# Patient Record
Sex: Male | Born: 1955
Health system: Southern US, Community
[De-identification: ages and names within clinical notes are randomized; demographics above are authoritative.]

## PROBLEM LIST (undated history)

## (undated) DIAGNOSIS — IMO0002 Reserved for concepts with insufficient information to code with codable children: Secondary | ICD-10-CM

## (undated) DIAGNOSIS — C9110 Chronic lymphocytic leukemia of B-cell type not having achieved remission: Secondary | ICD-10-CM

## (undated) DIAGNOSIS — K219 Gastro-esophageal reflux disease without esophagitis: Secondary | ICD-10-CM

## (undated) DIAGNOSIS — Z800 Family history of malignant neoplasm of digestive organs: Secondary | ICD-10-CM

## (undated) DIAGNOSIS — I639 Cerebral infarction, unspecified: Secondary | ICD-10-CM

## (undated) DIAGNOSIS — E119 Type 2 diabetes mellitus without complications: Secondary | ICD-10-CM

## (undated) DIAGNOSIS — G4730 Sleep apnea, unspecified: Secondary | ICD-10-CM

## (undated) DIAGNOSIS — K602 Anal fissure, unspecified: Secondary | ICD-10-CM

## (undated) DIAGNOSIS — K625 Hemorrhage of anus and rectum: Secondary | ICD-10-CM

## (undated) DIAGNOSIS — H409 Unspecified glaucoma: Secondary | ICD-10-CM

## (undated) DIAGNOSIS — M1990 Unspecified osteoarthritis, unspecified site: Secondary | ICD-10-CM

## (undated) DIAGNOSIS — I10 Essential (primary) hypertension: Secondary | ICD-10-CM

## (undated) DIAGNOSIS — G4733 Obstructive sleep apnea (adult) (pediatric): Secondary | ICD-10-CM

## (undated) DIAGNOSIS — K760 Fatty (change of) liver, not elsewhere classified: Secondary | ICD-10-CM

## (undated) HISTORY — DX: Reserved for concepts with insufficient information to code with codable children: IMO0002

## (undated) HISTORY — PX: TONSILLECTOMY: SUR1361

## (undated) HISTORY — PX: GLAUCOMA SURGERY: SHX656

## (undated) HISTORY — DX: Anal fissure, unspecified: K60.2

## (undated) HISTORY — DX: Fatty (change of) liver, not elsewhere classified: K76.0

## (undated) HISTORY — DX: Obstructive sleep apnea (adult) (pediatric): G47.33

## (undated) HISTORY — DX: Family history of malignant neoplasm of digestive organs: Z80.0

## (undated) HISTORY — DX: Sleep apnea, unspecified: G47.30

## (undated) HISTORY — DX: Type 2 diabetes mellitus without complications: E11.9

## (undated) HISTORY — DX: Cerebral infarction, unspecified: I63.9

## (undated) HISTORY — PX: LUMBAR DISC SURGERY: SHX700

## (undated) HISTORY — DX: Gastro-esophageal reflux disease without esophagitis: K21.9

## (undated) HISTORY — DX: Unspecified osteoarthritis, unspecified site: M19.90

## (undated) HISTORY — DX: Chronic lymphocytic leukemia of B-cell type not having achieved remission: C91.10

## (undated) HISTORY — PX: PYLOROPLASTY: SHX418

## (undated) HISTORY — DX: Hemorrhage of anus and rectum: K62.5

## (undated) HISTORY — DX: Unspecified glaucoma: H40.9

## (undated) HISTORY — PX: EYE SURGERY: SHX253

## (undated) HISTORY — DX: Essential (primary) hypertension: I10

---

## 2004-04-05 ENCOUNTER — Ambulatory Visit: Payer: Self-pay | Admitting: Family Medicine

## 2004-12-03 ENCOUNTER — Ambulatory Visit: Payer: Self-pay | Admitting: Family Medicine

## 2004-12-07 ENCOUNTER — Ambulatory Visit: Payer: Self-pay | Admitting: Cardiology

## 2004-12-22 ENCOUNTER — Ambulatory Visit: Payer: Self-pay | Admitting: Family Medicine

## 2005-08-29 ENCOUNTER — Ambulatory Visit: Payer: Self-pay | Admitting: Family Medicine

## 2005-09-01 ENCOUNTER — Ambulatory Visit: Payer: Self-pay | Admitting: Family Medicine

## 2006-03-10 ENCOUNTER — Ambulatory Visit: Payer: Self-pay | Admitting: Family Medicine

## 2006-07-13 ENCOUNTER — Ambulatory Visit: Payer: Self-pay | Admitting: Family Medicine

## 2007-01-25 ENCOUNTER — Ambulatory Visit: Payer: Self-pay | Admitting: Family Medicine

## 2007-01-25 DIAGNOSIS — N529 Male erectile dysfunction, unspecified: Secondary | ICD-10-CM | POA: Insufficient documentation

## 2007-01-25 DIAGNOSIS — M109 Gout, unspecified: Secondary | ICD-10-CM | POA: Insufficient documentation

## 2007-02-23 ENCOUNTER — Ambulatory Visit: Payer: Self-pay | Admitting: Family Medicine

## 2008-10-16 ENCOUNTER — Ambulatory Visit: Payer: Self-pay | Admitting: Family Medicine

## 2008-10-16 LAB — CONVERTED CEMR LAB
ALT: 22 units/L (ref 0–53)
Albumin: 4.5 g/dL (ref 3.5–5.2)
Alkaline Phosphatase: 57 units/L (ref 39–117)
Basophils Absolute: 0.1 10*3/uL (ref 0.0–0.1)
Blood in Urine, dipstick: NEGATIVE
CO2: 28 meq/L (ref 19–32)
Calcium: 9.5 mg/dL (ref 8.4–10.5)
Chloride: 106 meq/L (ref 96–112)
Creatinine, Ser: 0.9 mg/dL (ref 0.4–1.5)
GFR calc non Af Amer: 93.24 mL/min (ref >60)
HCT: 42.7 % (ref 39.0–52.0)
Monocytes Absolute: 0.5 10*3/uL (ref 0.1–1.0)
Monocytes Relative: 8.3 % (ref 3.0–12.0)
Potassium: 4.3 meq/L (ref 3.5–5.1)
RBC: 4.74 M/uL (ref 4.22–5.81)
Sodium: 144 meq/L (ref 135–145)
TSH: 1.24 microintl units/mL (ref 0.35–5.50)
Total Bilirubin: 1.2 mg/dL (ref 0.3–1.2)
Total CHOL/HDL Ratio: 4
Total Protein: 7.2 g/dL (ref 6.0–8.3)
WBC Urine, dipstick: NEGATIVE
WBC: 5.6 10*3/uL (ref 4.5–10.5)

## 2008-11-10 ENCOUNTER — Ambulatory Visit: Payer: Self-pay | Admitting: Family Medicine

## 2008-12-01 ENCOUNTER — Ambulatory Visit: Payer: Self-pay | Admitting: Family Medicine

## 2008-12-15 ENCOUNTER — Ambulatory Visit: Payer: Self-pay | Admitting: Family Medicine

## 2009-03-06 ENCOUNTER — Emergency Department (HOSPITAL_COMMUNITY): Admission: EM | Admit: 2009-03-06 | Discharge: 2009-03-07 | Payer: Self-pay | Admitting: Emergency Medicine

## 2009-03-06 ENCOUNTER — Telehealth: Payer: Self-pay | Admitting: Family Medicine

## 2009-03-06 ENCOUNTER — Encounter: Payer: Self-pay | Admitting: Cardiology

## 2009-04-02 ENCOUNTER — Ambulatory Visit: Payer: Self-pay | Admitting: Cardiology

## 2009-05-19 ENCOUNTER — Ambulatory Visit: Payer: Self-pay | Admitting: Cardiology

## 2009-08-24 ENCOUNTER — Ambulatory Visit (HOSPITAL_BASED_OUTPATIENT_CLINIC_OR_DEPARTMENT_OTHER): Admission: RE | Admit: 2009-08-24 | Discharge: 2009-08-24 | Payer: Self-pay | Admitting: Cardiology

## 2009-08-24 ENCOUNTER — Encounter: Payer: Self-pay | Admitting: Cardiology

## 2009-08-31 ENCOUNTER — Ambulatory Visit: Payer: Self-pay | Admitting: Pulmonary Disease

## 2009-09-16 ENCOUNTER — Telehealth: Payer: Self-pay | Admitting: Gastroenterology

## 2009-10-02 ENCOUNTER — Telehealth: Payer: Self-pay | Admitting: Cardiology

## 2009-10-29 ENCOUNTER — Ambulatory Visit: Payer: Self-pay | Admitting: Pulmonary Disease

## 2009-10-29 DIAGNOSIS — G4733 Obstructive sleep apnea (adult) (pediatric): Secondary | ICD-10-CM | POA: Insufficient documentation

## 2009-11-27 ENCOUNTER — Ambulatory Visit: Payer: Self-pay | Admitting: Family Medicine

## 2009-11-27 DIAGNOSIS — I10 Essential (primary) hypertension: Secondary | ICD-10-CM | POA: Insufficient documentation

## 2009-12-04 ENCOUNTER — Ambulatory Visit: Payer: Self-pay | Admitting: Family Medicine

## 2009-12-07 LAB — CONVERTED CEMR LAB
BUN: 23 mg/dL (ref 6–23)
Calcium: 10.2 mg/dL (ref 8.4–10.5)
Chloride: 104 meq/L (ref 96–112)
Creatinine, Ser: 0.9 mg/dL (ref 0.4–1.5)
GFR calc non Af Amer: 90.53 mL/min (ref >60)

## 2009-12-14 ENCOUNTER — Ambulatory Visit: Payer: Self-pay | Admitting: Family Medicine

## 2009-12-14 ENCOUNTER — Telehealth: Payer: Self-pay | Admitting: Family Medicine

## 2010-06-13 LAB — CONVERTED CEMR LAB
ALT: 22 units/L (ref 0–53)
AST: 19 units/L (ref 0–37)
Albumin: 4.3 g/dL (ref 3.5–5.2)
Basophils Absolute: 0 10*3/uL (ref 0.0–0.1)
CO2: 28 meq/L (ref 19–32)
Creatinine, Ser: 0.9 mg/dL (ref 0.4–1.5)
HDL: 35.4 mg/dL — ABNORMAL LOW (ref >39.0)
Hemoglobin: 14.2 g/dL (ref 13.0–17.0)
LDL Cholesterol: 118 mg/dL — ABNORMAL HIGH (ref 0–99)
MCHC: 35.3 g/dL (ref 30.0–36.0)
Monocytes Absolute: 0.5 10*3/uL (ref 0.2–0.7)
Monocytes Relative: 10.3 % (ref 3.0–11.0)
Neutrophils Relative %: 44.3 % (ref 43.0–77.0)
PSA: 1.07 ng/mL (ref 0.10–4.00)
Potassium: 4.7 meq/L (ref 3.5–5.1)
RBC: 4.51 M/uL (ref 4.22–5.81)
RDW: 11.6 % (ref 11.5–14.6)
TSH: 1.58 microintl units/mL (ref 0.35–5.50)
Total Bilirubin: 0.8 mg/dL (ref 0.3–1.2)
Total Protein: 6.9 g/dL (ref 6.0–8.3)
Uric Acid, Serum: 10.6 mg/dL — ABNORMAL HIGH (ref 2.4–7.0)
VLDL: 18 mg/dL (ref 0–40)
WBC: 4.5 10*3/uL (ref 4.5–10.5)

## 2010-06-17 NOTE — Assessment & Plan Note (Signed)
Summary: eye--bp issues//ccm   Vital Signs:  Patient profile:   55 year old male Weight:      268 pounds Temp:     98.0 degrees F oral BP sitting:   150 / 100  (left arm) Cuff size:   regular  Vitals Entered By: Ross [name] CMA Duncan Dull) (November 27, 2009 11:59 AM) CC: blurr vision right eye, bp concerns   Primary Care Provider:  Roderick Pee MD  CC:  blurr vision right eye and bp concerns.  History of Present Illness: Ross [name] is a 55 year old male, who comes in today for evaluation of possible hypertension.  In the past.  He said history of elevated blood pressures occasionally, but he's been monitoring his blood pressure at home in his home BPs have been normal.  However, he hasn't checked his blood pressure in about 6 months.  Yesterday he noticed some blurred vision in his left eye.  It went to [facility] and was told he had a ruptured blood vessel in his eye.  It was most likely due to hypertension.  His BP here by me and Rachel 160 over hundred.  His digital cuff at home.  This morning at 130/81.  I suspect he has a malfunctioning cuff,  advised him to get a new BP cuff  Allergies: No Known Drug Allergies  Past History:  Past medical, surgical, family and social histories (including risk factors) reviewed, and no changes noted (except as noted below).  Past Medical History: Reviewed history from 10/29/2009 and no changes required. Gout Erectile dysfunction Borderline hypertension allergic rhinitis  Past Surgical History: Reviewed history from 04/02/2009 and no changes required. Lumbar laminectomy Pyloric stenosis  Family History: Reviewed history from 10/29/2009 and no changes required. Family History of Colon CA 1st degree relative <60 Family History of Prostate CA 1st degree relative <50  cancer: mother (breast), father Dispensing optician)   Social History: Reviewed history from 10/29/2009 and no changes required. Married to Ross [name]. pt has  children. 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   Review of Systems      See HPI  Physical Exam  General:  Well-developed,well-nourished,in no acute distress; alert,appropriate and cooperative throughout examination Heart:  160 over hundred right arm sitting position   Problems:  Medical Problems Added: 1)  Dx of Hypertension, Essential Nos  (ICD-401.9)  Impression & Recommendations:  Problem # 1:  HYPERTENSION, ESSENTIAL NOS (ICD-401.9) Assessment New  His updated medication list for this problem includes:    Zestoretic 20-12.5 Mg Tabs (Lisinopril-hydrochlorothiazide) .Marland Kitchen... Take 1 tablet by mouth every morning  Orders: Prescription Created Electronically [phone])  Complete Medication List: 1)  Allopurinol 300 Mg Tabs (Allopurinol) .Marland Kitchen.. 1 once daily 2)  Viagra 100 Mg Tabs (Sildenafil citrate) .... Uad 3)  Asa 81  4)  Multivitamins Tabs (Multiple vitamin) .Marland Kitchen.. 1 by mouth daily 5)  Zestoretic 20-12.5 Mg Tabs (Lisinopril-hydrochlorothiazide) .... Take 1 tablet by mouth every morning  Patient Instructions: 1)  began Zestoretic one tablet today, then one tablet q.a.m. check a morning blood pressure with urinary digital cuff.  Daily return in one week for follow.  When you return bring a record of all your blood pressure readings and the device Prescriptions: ZESTORETIC 20-12.5 MG TABS (LISINOPRIL-HYDROCHLOROTHIAZIDE) Take 1 tablet by mouth every morning  #100 x 3   Entered and Authorized by:   Roderick Pee MD   Signed by:   Roderick Pee  MD on 11/27/2009   Method used:   Electronically to        [id number] (ret[facility])       [address]       [place], [place]  [address]       Ph: [phone] or [phone]       Fax: [phone]   RxID:   [id number]

## 2010-06-17 NOTE — Assessment & Plan Note (Signed)
Summary: vision changes/dm   Vital Signs:  Patient profile:   55 year old male Weight:      258 pounds Temp:     98.5 degrees F oral BP sitting:   112 / 80  (left arm) Cuff size:   regular  Vitals Entered By: Kern Reap CMA Duncan Dull) (December 14, 2009 2:10 PM) CC: vision changes  Vision Screening:Left eye with correction: 20 / 20 Right eye with correction: 20 / 20 Both eyes with correction: 20 / 20        Vision Entered By: Kern Reap CMA Duncan Dull) (December 14, 2009 2:11 PM)   Primary Care Provider:  Roderick Pee MD  CC:  vision changes.  History of Present Illness: Prather is a 55 year old, married male, nonsmoker, recently diagnosed with hypertension, who is on Zestoretic 20 -- 4.5 daily, which is normalized.  His blood pressure.  Prior to starting medication.  He went to his ophthalmologist.  He had some changes in his retina due to the hypertension.  Last Friday.  He was at work and noticed a squiggly line going across his right eye.  He was concerned and went home however, her BP was normal.  Today, he feels normal, and BP 112/80.  Visual acuity 2020 right, and left  Allergies: No Known Drug Allergies  Past History:  Past medical, surgical, family and social histories (including risk factors) reviewed for relevance to current acute and chronic problems.  Past Medical History: Reviewed history from 10/29/2009 and no changes required. Gout Erectile dysfunction Borderline hypertension allergic rhinitis  Past Surgical History: Reviewed history from 04/02/2009 and no changes required. Lumbar laminectomy Pyloric stenosis  Family History: Reviewed history from 10/29/2009 and no changes required. Family History of Colon CA 1st degree relative <60 Family History of Prostate CA 1st degree relative <50  cancer: mother (breast), father Dispensing optician)   Social History: Reviewed history from 10/29/2009 and no changes required. Married to [place]. pt has  children. 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   Review of Systems      See HPI  Physical Exam  General:  Well-developed,well-nourished,in no acute distress; alert,appropriate and cooperative throughout examination Eyes:  No corneal or conjunctival inflammation noted. EOMI. Perrla. Funduscopic exam benign, without hemorrhages, exudates or papilledema. Vision grossly normal.   Impression & Recommendations:  Problem # 1:  HYPERTENSION, ESSENTIAL NOS (ICD-401.9) Assessment Improved  His updated medication list for this problem includes:    Zestoretic 20-12.5 Mg Tabs (Lisinopril-hydrochlorothiazide) .Marland Kitchen... Take 1 tablet by mouth every morning  Complete Medication List: 1)  Allopurinol 300 Mg Tabs (Allopurinol) .Marland Kitchen.. 1 once daily 2)  Viagra 100 Mg Tabs (Sildenafil citrate) .... Uad 3)  Asa 81  4)  Multivitamins Tabs (Multiple vitamin) .Marland Kitchen.. 1 by mouth daily 5)  Zestoretic 20-12.5 Mg Tabs (Lisinopril-hydrochlorothiazide) .... Take 1 tablet by mouth every morning  Patient Instructions: 1)  keep your follow-up appointment with the ophthalmologist on the 18th of August however, if he noticed any other changes in your eyes, call and see them sooner. 2)  Check y  blood pressure daily in the morning Prescriptions: VIAGRA 100 MG  TABS (SILDENAFIL CITRATE) UAD  #6 x 11   Entered and Authorized by:   Roderick Pee MD   Signed by:   Roderick Pee MD on 12/14/2009   Method used:   Electronically to        [id number] ([facility]       [address]       [place], [place]  [address]       Ph: [phone] or [phone]       Fax: [phone]   RxID:   [id number]

## 2010-06-17 NOTE — Miscellaneous (Signed)
Summary: Orders Update  Clinical Lists Changes  Orders: Added new Referral order of Pulmonary Referral (Pulmonary) - Signed 

## 2010-06-17 NOTE — Assessment & Plan Note (Signed)
Summary: 1 WK ROV/NJR   Vital Signs:  Patient profile:   55 year old male Weight:      260 pounds Temp:     98.0 degrees F oral BP sitting:   112 / 78  (left arm) Cuff size:   regular  Vitals Entered By: Kern Reap CMA Duncan Dull) (December 04, 2009 11:12 AM) CC: follow-up visit   Primary Care Provider:  Roderick Pee MD  CC:  follow-up visit.  History of Present Illness: Ross [name] is a 55 year old male comes in today for reevaluation of hypertension.  We started him on Zestoretic 20 -- 12.5 daily on 715 2011.  BP 120/80.  Today, when 12/78.  No side effects from medication.  Review of systems negative  Allergies: No Known Drug Allergies  Past History:  Past medical, surgical, family and social histories (including risk factors) reviewed for relevance to current acute and chronic problems.  Past Medical History: Reviewed history from 10/29/2009 and no changes required. Gout Erectile dysfunction Borderline hypertension allergic rhinitis  Past Surgical History: Reviewed history from 04/02/2009 and no changes required. Lumbar laminectomy Pyloric stenosis  Family History: Reviewed history from 10/29/2009 and no changes required. Family History of Colon CA 1st degree relative <60 Family History of Prostate CA 1st degree relative <50  cancer: mother (breast), father Dispensing optician)   Social History: Reviewed history from 10/29/2009 and no changes required. Married to Ross [name]. pt has children. 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   Review of Systems      See HPI  Physical Exam  General:  Well-developed,well-nourished,in no acute distress; alert,appropriate and cooperative throughout examination Heart:  120/80   Impression & Recommendations:  Problem # 1:  HYPERTENSION, ESSENTIAL NOS (ICD-401.9) Assessment Improved  His updated medication list for this problem includes:  Zestoretic 20-12.5 Mg Tabs (Lisinopril-hydrochlorothiazide) .Marland Kitchen... Take 1 tablet by mouth every morning  Orders: Venipuncture (04540) TLB-BMP (Basic Metabolic Panel-BMET) (80048-METABOL)  Complete Medication List: 1)  Allopurinol 300 Mg Tabs (Allopurinol) .Marland Kitchen.. 1 once daily 2)  Viagra 100 Mg Tabs (Sildenafil citrate) .... Uad 3)  Asa 81  4)  Multivitamins Tabs (Multiple vitamin) .Marland Kitchen.. 1 by mouth daily 5)  Zestoretic 20-12.5 Mg Tabs (Lisinopril-hydrochlorothiazide) .... Take 1 tablet by mouth every morning  Patient Instructions: 1)  continued to take her medication daily.  When you changed shifts and work from 6 p.m. to 6 a.m..  Take your medication at 3 p.m. in the afternoon. 2)  Since her blood pressure is normal.  Then going forward.  Check your blood pressure once a week.  If, however, you get an abnormal reading, and check it daily for two weeks in a row.  If after two weeks, your blood pressures are all normal, then go back and checked it weekly if however, after two weeks in a row, your blood pressures are all elevated, then bring the data and the device and return to see me for follow-up. 3)  We will call you next week with your lab reports

## 2010-06-17 NOTE — Progress Notes (Signed)
Summary: Schedule Colonoscopy  Phone Note Outgoing Call Call back at Home Phone [phone]   Call placed by: Harlow Mares CMA Duncan Dull),  Sep 16, 2009 11:13 AM Call placed to: Patient Summary of Call: pt due for colonoscopy due to family hx colon cancer, he states that he will call his insurance company and call back to schedule. I gave him our number. and the information needed to call his insurance company Initial call taken by: Harlow Mares CMA Duncan Dull),  Sep 16, 2009 11:14 AM

## 2010-06-17 NOTE — Progress Notes (Signed)
Summary: blurred vision  Phone Note Call from Patient   Caller: Patient Call For: Roderick Pee MD Summary of Call: Pt feels his meds have affected his eysight in his right eye (blurred).  Asking for advice?? [phone] Initial call taken by: Lynann Beaver CMA,  December 14, 2009 10:53 AM  Follow-up for Phone Call        office visit today Follow-up by: Roderick Pee MD,  December 14, 2009 10:55 AM  Additional Follow-up for Phone Call Additional follow up Details #1::        Pt. scheduled an office visit. Additional Follow-up by: Lynann Beaver CMA,  December 14, 2009 11:00 AM

## 2010-06-17 NOTE — Letter (Signed)
Summary: Out of Work  Adult nurse at [employer]  [address]   [place], [place] [address]   Phone: [phone]  Fax: [phone]    December 14, 2009   Employee:  EVERETT [employer]HRL[employer]    To Whom It May Concern:   For Medical reasons, please excuse the above named employee from work for the following dates:  Start:   December 11, 2009  End:   December 12, 2009  If you need additional information, please feel free to contact our office.         Sincerely,    Kelle Darting, MD

## 2010-06-17 NOTE — Assessment & Plan Note (Signed)
Summary: consult for osa   Copy to:  Rollene Rotunda Primary Provider/Referring Provider:  Roderick Pee MD  CC:  Sleep Consult.  History of Present Illness: The pt is a 55y/o male who I have been asked to see for osa.  He recently underwent a npsg, where he was found to have AHI of 19/hr with desats to 87%.  His wife has complained of loud snoring, as well as an abnormal breathing pattern during sleep intermittantly.  He goes to bed 11pm when not working 3rd shift, and arises at 8-9am to start his day.  He is not rested upon arising.  When he works third shift he sleeps from 7 to about 2pm.  He is not rested upon arising, and does admit to somewhat frequent awakenings during the night.  He does feel tired during the day, and describes intermittant sleep pressure while at work.  He does have some sleepiness with watching tv or reading when at home.  He denies any sleepiness driving except occasionally when he drives home after completing his night shift.  The pt tells me his weight is up 15 pounds over the last 2 years, and his epworth score at the time of his sleep study was 4.    Medications Prior to Update: 1)  Allopurinol 300 Mg  Tabs (Allopurinol) .Marland Kitchen.. 1 Once Daily 2)  Viagra 100 Mg  Tabs (Sildenafil Citrate) .... Uad 3)  Asa 81 4)  Multivitamins   Tabs (Multiple Vitamin) .Marland Kitchen.. 1 By Mouth Daily  Allergies (verified): No Known Drug Allergies  Past History:  Past Medical History: Gout Erectile dysfunction Borderline hypertension allergic rhinitis  Past Surgical History: Reviewed history from 04/02/2009 and no changes required. Lumbar laminectomy Pyloric stenosis  Family History: Reviewed history from 01/25/2007 and no changes required. Family History of Colon CA 1st degree relative <60 Family History of Prostate CA 1st degree relative <50  cancer: mother (breast), father Dispensing optician)   Social History: Reviewed history from 04/02/2009 and no changes  required. Married to [place]. pt has children. 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   Review of Systems       The patient complains of non-productive cough, acid heartburn, indigestion, weight change, and ear ache.  The patient denies shortness of breath with activity, shortness of breath at rest, productive cough, coughing up blood, chest pain, irregular heartbeats, loss of appetite, abdominal pain, difficulty swallowing, sore throat, tooth/dental problems, headaches, nasal congestion/difficulty breathing through nose, sneezing, itching, anxiety, depression, hand/feet swelling, joint stiffness or pain, rash, change in color of mucus, and fever.    Vital Signs:  Patient profile:   55 year old male Height:      73 inches Weight:      265 pounds BMI:     35.09 O2 Sat:      98 % on Room air Pulse rate:   81 / minute BP sitting:   120 / 82  (left arm) Cuff size:   large  Vitals Entered By: Arman Filter LPN (October 29, 2009 3:05 PM)  O2 Flow:  Room air CC: Sleep Consult Comments Medications reviewed with patient Arman Filter LPN  October 29, 2009 3:05 PM    Physical Exam  General:  obese male in nad Eyes:  PERRLA and EOMI.   Nose:  turbinate hypertrophy, no severe obstruction Mouth:  elongation of soft palate and uvula Neck:  no jvd, tmg, LN Lungs:  clear to auscultation Heart:  rrr, no mrg Abdomen:  soft and nontender, bs+ Extremities:  minimal ankle edema, no cyanosis pulses intact distally Neurologic:  alert and oriented, doesn't appear sleepy.   Impression & Recommendations:  Problem # 1:  OBSTRUCTIVE SLEEP APNEA (ICD-327.23) the pt has mild to moderate osa by his recent sleep study, but is not overly symptomatic with respect to impact on his QOL.  He does not have a lot of comorbid disease, and therefore represents very little risk from a health standpoint to him at this level.  I have reviewed the  various treatment options with him, including a trial of weight loss alone, upper airway surgery, dental appliance, and cpap.  The pt is leaning toward taking the next 6mos and working hard on weight loss.  He is also more amenable to an oral appliance than cpap if he is unsuccessful with weight loss.    Other Orders: Consultation Level IV (04540)  Patient Instructions: 1)  work on weight reduction aggressively 2)  decide about treatment with cpap, dental appliance, or surgery while working on weight loss vs. trial of weight loss alone.  Let me know what you decide.

## 2010-06-17 NOTE — Progress Notes (Signed)
Summary: sleep study results refer to pulmonary  Phone Note Outgoing Call   Call placed by: Charolotte Capuchin, RN,  Oct 02, 2009 4:53 PM Call placed to: Patient Details for Reason: sleep study Summary of Call: called pt to let him know  the results of his sleep study and the need to see pulomonary MD for sleep disorder and treatment.  Results demonstrate mild to moderate obstructive sleep apnea with desats as low as 87% and an apnea-hypopnea index of 19 events per hour.  Left message for pt to call back on Monday for results and orders. Initial call taken by: Charolotte Capuchin, RN,  Oct 02, 2009 5:01 PM  Follow-up for Phone Call        returning call, Migdalia Dk  Oct 07, 2009 3:39 PM   Left message for pt to call back  Sander Nephew, RN   pt aware of results and that he will be called with an appointment for pulmonary Follow-up by: Charolotte Capuchin, RN,  Oct 13, 2009 4:20 PM

## 2010-07-08 ENCOUNTER — Ambulatory Visit (INDEPENDENT_AMBULATORY_CARE_PROVIDER_SITE_OTHER): Payer: BC Managed Care – PPO | Admitting: Family Medicine

## 2010-07-08 ENCOUNTER — Encounter: Payer: Self-pay | Admitting: Family Medicine

## 2010-07-08 VITALS — BP 110/80 | Temp 98.0°F | Ht 73.0 in | Wt 264.0 lb

## 2010-07-08 DIAGNOSIS — K625 Hemorrhage of anus and rectum: Secondary | ICD-10-CM

## 2010-07-08 DIAGNOSIS — M109 Gout, unspecified: Secondary | ICD-10-CM

## 2010-07-08 MED ORDER — HYDROCORTISONE ACETATE 25 MG RE SUPP: 25.0000 mg | Freq: Two times a day (BID) | RECTAL | Status: DC

## 2010-07-08 MED ORDER — ALLOPURINOL 300 MG PO TABS: 300.0000 mg | ORAL_TABLET | Freq: Every day | ORAL | Status: DC

## 2010-07-08 NOTE — Progress Notes (Signed)
  Subjective:    Patient ID: Ross [name], male    DOB: 1955/12/05, 55 y.o.   MRN: [id number]  HPICharles is a 55 year old male, nonsmoker, who comes in today for evaluation of bright red rectal bleeding.  He's had an intermittent history of this for many years.  It years ago.  He had a colonoscopy, which reported to be normal.  He states typically, the bleeding occurs after he has an intense bout of itching.  Review of systems otherwise negative balance normal.  No pain.  No change in the caliber of his bowel movements.  No weight loss, etc.    Review of Systems    Negative Objective:   Physical Exam    Well-developed well-nourished man in no acute distress.  Examination of the rectum shows some excoriation from itching.  No obvious external hemorrhoids.  Digital exam shows a normal prostate stool guaiac-negative    Assessment & Plan:  Intermittent bright red rectal bleeding associated with pruritus.  Plan Anusol-HC suppositories nightly x 10 days.  Follow-up in GI

## 2010-07-08 NOTE — Patient Instructions (Signed)
Insert one of the medicated suppositories nightly at bedtime.  Follow-up in GI

## 2010-07-09 ENCOUNTER — Encounter (INDEPENDENT_AMBULATORY_CARE_PROVIDER_SITE_OTHER): Payer: Self-pay | Admitting: *Deleted

## 2010-07-13 NOTE — Letter (Signed)
Summary: New Patient letter  [facility]  [address] [place], [place] [address]   Phone: [phone]  Fax: [phone]       07/09/2010 MRN: [id number]  Ross [name] [address] [place], [place]  [address]  [place]  Dear Mr. AGUINAGA,  Welcome to the Gastroenterology Division at [facility].    You are scheduled to see Dr.  Russella Dar on 08-16-10 at 10:00A.M. on the 3rd floor at [facility], [address]  We ask that you try to arrive at our office 15 minutes prior to your appointment time to allow for check-in.  We would like you to complete the enclosed self-administered evaluation form prior to your visit and bring it with you on the day of your appointment.  We will review it with you.  Also, please bring a complete list of all your medications or, if you prefer, bring the medication bottles and we will list them.  Please bring your insurance card so that we may make a copy of it.  If your insurance requires a referral to see a specialist, please bring your referral form from your primary care physician.  Co-payments are due at the time of your visit and may be paid by cash, check or credit card.     Your office visit will consist of a consult with your physician (includes a physical exam), any laboratory testing he/she may order, scheduling of any necessary diagnostic testing (e.g. x-ray, ultrasound, CT-scan), and scheduling of a procedure (e.g. Endoscopy, Colonoscopy) if required.  Please allow enough time on your schedule to allow for any/all of these possibilities.    If you cannot keep your appointment, please call [phone] to cancel or reschedule prior to your appointment date.  This allows [place] the opportunity to schedule an appointment for another patient in need of care.  If you do not cancel or reschedule by 5 p.m. the business day prior to your appointment date, you will be charged a $50.00 late cancellation/no-show fee.    Thank you for choosing  [facility] for your medical needs.  We appreciate the opportunity to care for you.  Please visit [place] at our website  to learn more about our practice.                     Sincerely,                                                             The Gastroenterology Division

## 2010-08-16 ENCOUNTER — Ambulatory Visit: Payer: BC Managed Care – PPO | Admitting: Gastroenterology

## 2010-08-19 LAB — DIFFERENTIAL
Basophils Relative: 1 % (ref 0–1)
Eosinophils Relative: 3 % (ref 0–5)
Monocytes Absolute: 0.7 10*3/uL (ref 0.1–1.0)
Monocytes Relative: 11 % (ref 3–12)
Neutro Abs: 3.6 10*3/uL (ref 1.7–7.7)

## 2010-08-19 LAB — POCT CARDIAC MARKERS
CKMB, poc: 1.2 ng/mL (ref 1.0–8.0)
Myoglobin, poc: 91.2 ng/mL (ref 12–200)

## 2010-08-19 LAB — POCT I-STAT, CHEM 8
BUN: 17 mg/dL (ref 6–23)
Chloride: 108 mEq/L (ref 96–112)
Creatinine, Ser: 0.9 mg/dL (ref 0.4–1.5)
Glucose, Bld: 103 mg/dL — ABNORMAL HIGH (ref 70–99)
Hemoglobin: 13.6 g/dL (ref 13.0–17.0)
Potassium: 3.9 mEq/L (ref 3.5–5.1)
Sodium: 142 mEq/L (ref 135–145)

## 2010-08-19 LAB — CBC
HCT: 39.5 % (ref 39.0–52.0)
Hemoglobin: 13.8 g/dL (ref 13.0–17.0)
MCHC: 34.8 g/dL (ref 30.0–36.0)
RBC: 4.28 MIL/uL (ref 4.22–5.81)

## 2010-08-19 LAB — URINALYSIS, ROUTINE W REFLEX MICROSCOPIC
Bilirubin Urine: NEGATIVE
Hgb urine dipstick: NEGATIVE
Ketones, ur: NEGATIVE mg/dL
Specific Gravity, Urine: 1.029 (ref 1.005–1.030)
Urobilinogen, UA: 0.2 mg/dL (ref 0.0–1.0)
pH: 5.5 (ref 5.0–8.0)

## 2010-08-24 ENCOUNTER — Other Ambulatory Visit: Payer: Self-pay | Admitting: Family Medicine

## 2010-09-17 ENCOUNTER — Ambulatory Visit: Payer: BC Managed Care – PPO | Admitting: Gastroenterology

## 2010-09-21 ENCOUNTER — Other Ambulatory Visit (INDEPENDENT_AMBULATORY_CARE_PROVIDER_SITE_OTHER): Payer: BC Managed Care – PPO

## 2010-09-21 ENCOUNTER — Encounter: Payer: Self-pay | Admitting: Gastroenterology

## 2010-09-21 ENCOUNTER — Ambulatory Visit (INDEPENDENT_AMBULATORY_CARE_PROVIDER_SITE_OTHER): Payer: BC Managed Care – PPO | Admitting: Gastroenterology

## 2010-09-21 VITALS — BP 126/78 | HR 64 | Ht 73.0 in | Wt 264.0 lb

## 2010-09-21 DIAGNOSIS — Z800 Family history of malignant neoplasm of digestive organs: Secondary | ICD-10-CM

## 2010-09-21 DIAGNOSIS — K625 Hemorrhage of anus and rectum: Secondary | ICD-10-CM

## 2010-09-21 LAB — IBC PANEL
Iron: 72 ug/dL (ref 42–165)
Saturation Ratios: 19.9 % — ABNORMAL LOW (ref 20.0–50.0)
Transferrin: 258.9 mg/dL (ref 212.0–360.0)

## 2010-09-21 LAB — CBC WITH DIFFERENTIAL/PLATELET
Basophils Relative: 0.8 % (ref 0.0–3.0)
Eosinophils Absolute: 0.3 10*3/uL (ref 0.0–0.7)
Hemoglobin: 14.3 g/dL (ref 13.0–17.0)
Lymphs Abs: 2.2 10*3/uL (ref 0.7–4.0)
MCHC: 35.3 g/dL (ref 30.0–36.0)
MCV: 90.8 fl (ref 78.0–100.0)
Monocytes Absolute: 0.6 10*3/uL (ref 0.1–1.0)
Neutro Abs: 2.3 10*3/uL (ref 1.4–7.7)
RBC: 4.47 Mil/uL (ref 4.22–5.81)

## 2010-09-21 LAB — FOLATE: Folate: 17.8 ng/mL

## 2010-09-21 LAB — FERRITIN: Ferritin: 61.2 ng/mL (ref 22.0–322.0)

## 2010-09-21 LAB — VITAMIN B12: Vitamin B-12: 281 pg/mL (ref 211–911)

## 2010-09-21 MED ORDER — PEG-KCL-NACL-NASULF-NA ASC-C 100 G PO SOLR: 1.0000 | Freq: Once | ORAL | Status: AC

## 2010-09-21 NOTE — Progress Notes (Signed)
History of Present Illness:  This is a pleasant 55 year old white male with intermittent rectal bleeding for many years worse over the last 2 months with associated rectal itching. He denies bowel irregularities, abdominal pain, history of anemia, and is not on anticoagulants. Colonoscopy in 2005 which was performed because of the family history of colon cancer was unremarkable. He has got related GERD but denies dysphagia or any hepatobiliary or systemic complaints.  I have reviewed this patient's present history, medical and surgical past history, allergies and medications.     ROS: The remainder of the 10 point ROS is negative     Physical Exam: General well developed well nourished patient in no acute distress, appearing their stated age Eyes PERRLA, no icterus, fundoscopic exam per opthamologist Skin no lesions noted Neck supple, no adenopathy, no thyroid enlargement, no tenderness Chest clear to percussion and auscultation Heart no significant murmurs, gallops or rubs noted Abdomen no hepatosplenomegaly masses or tenderness, BS normal.  Rectal inspection normal no fissures, or fistulae noted.  No masses or tenderness on digital exam. Stool guaiac negative. Some perineal laceration noted but no definite fissure or fistula. Prostate exam is unremarkable. Extremities no acute joint lesions, edema, phlebitis or evidence of cellulitis. Neurologic patient oriented x 3, cranial nerves intact, no focal neurologic deficits noted. Psychological mental status normal and normal affect.  Assessment and plan: Probable recurrent hemorrhoidal bleeding-rule out colonic polyposis. We have scheduled colonoscopy his convenience, also CBC and anemia profile. I have suggested Balneol solution cleansing of his rectum twice a day followed by zinc oxide as tolerated.  No diagnosis found.

## 2010-09-21 NOTE — Patient Instructions (Signed)
Your procedure has been scheduled for 09/27/2010, please follow the seperate instructions.  Please go to the basement today for your labs.  Your prescription(s) have been sent to you pharmacy.  Buy Zinc Oxide OTC and use per rectum twice a day. Buy Balenol cleanser OTC and use to clean rectum after bowel movements.

## 2010-09-27 ENCOUNTER — Ambulatory Visit (AMBULATORY_SURGERY_CENTER): Payer: BC Managed Care – PPO | Admitting: Gastroenterology

## 2010-09-27 ENCOUNTER — Encounter: Payer: Self-pay | Admitting: Gastroenterology

## 2010-09-27 VITALS — BP 126/82 | HR 78 | Temp 97.9°F | Resp 24 | Ht 73.0 in | Wt 255.0 lb

## 2010-09-27 DIAGNOSIS — K625 Hemorrhage of anus and rectum: Secondary | ICD-10-CM

## 2010-09-27 DIAGNOSIS — Z800 Family history of malignant neoplasm of digestive organs: Secondary | ICD-10-CM

## 2010-09-27 DIAGNOSIS — K648 Other hemorrhoids: Secondary | ICD-10-CM

## 2010-09-27 DIAGNOSIS — K921 Melena: Secondary | ICD-10-CM

## 2010-09-27 MED ORDER — SODIUM CHLORIDE 0.9 % IV SOLN: 500.0000 mL | INTRAVENOUS | Status: DC

## 2010-09-27 NOTE — Progress Notes (Signed)
Patient nauseated and dizzy after IV insertion. HOB flattened, fld.flow to gravity. Patient improved and fld slowed. Patient stating he gets b=very anxious with IV  Sticks.

## 2010-09-27 NOTE — Patient Instructions (Signed)
INTERNAL HEMORRHOIDS-HANDOUT GIVEN  REPEAT EXAM IN 5 YEARS  METAMUCIL OR BENEFIBER DAILY (OVER THE COUNTER)  OVER THE COUNTER HEMORRHOID CARE AS NEEDED

## 2010-09-28 ENCOUNTER — Telehealth: Payer: Self-pay | Admitting: *Deleted

## 2010-09-28 NOTE — Telephone Encounter (Signed)
Follow up Call- Patient questions:  Do you have a fever, pain , or abdominal swelling? no Pain Score  0 *  Have you tolerated food without any problems? yes  Have you been able to return to your normal activities? yes  Do you have any questions about your discharge instructions: Diet   no Medications  no Follow up visit  no  Do you have questions or concerns about your Care? no  Actions: * If pain score is 4 or above: No action needed, pain <4.  Spoke with patients wife.  She stated that he was "doing fine".

## 2010-12-15 ENCOUNTER — Ambulatory Visit (INDEPENDENT_AMBULATORY_CARE_PROVIDER_SITE_OTHER): Payer: BC Managed Care – PPO | Admitting: Family Medicine

## 2010-12-15 ENCOUNTER — Encounter: Payer: Self-pay | Admitting: Family Medicine

## 2010-12-15 VITALS — BP 130/88 | Temp 98.2°F | Wt 272.0 lb

## 2010-12-15 DIAGNOSIS — Z1159 Encounter for screening for other viral diseases: Secondary | ICD-10-CM

## 2010-12-15 DIAGNOSIS — R05 Cough: Secondary | ICD-10-CM

## 2010-12-15 DIAGNOSIS — I10 Essential (primary) hypertension: Secondary | ICD-10-CM

## 2010-12-15 DIAGNOSIS — R053 Chronic cough: Secondary | ICD-10-CM

## 2010-12-15 DIAGNOSIS — R059 Cough, unspecified: Secondary | ICD-10-CM

## 2010-12-15 MED ORDER — LOSARTAN POTASSIUM-HCTZ 100-12.5 MG PO TABS: 1.0000 | ORAL_TABLET | Freq: Every day | ORAL | Status: DC

## 2010-12-15 NOTE — Progress Notes (Signed)
  Subjective:    Patient ID: Ross [name], male    DOB: 11-Aug-1955, 55 y.o.   MRN: [id number]  HPI Patient seen for evaluation of chronic cough. Duration approximately one and a half years. Placed on ACE inhibitor with lisinopril little over a year ago. Thinks he may have had some cough before then but cough worsened somewhat after starting lisinopril. Past smoker. No appetite or weight changes. No dyspnea. No hemoptysis. No history of allergic symptoms. No postnasal drip. Does have occasional GERD symptoms and frequent sensation of needing to clear throat. No history of asthma or wheezing.  Patient request hepatitis C screening. Wife recently diagnosed with hepatitis C. Patient denies any abdominal pain, nausea, vomiting, fever.  Past Medical History  Diagnosis Date  . Family history of malignant neoplasm of gastrointestinal tract     mother  . Sleep apnea   . Gout   . Hypertension   . DDD (degenerative disc disease)   . GERD (gastroesophageal reflux disease)   . Rectal bleeding   . Hemorrhoids    Past Surgical History  Procedure Date  . Tonsillectomy and adenoidectomy   . Pyloric stenosis     as infant  . Lumbar spine surgery     reports that he has quit smoking. He has never used smokeless tobacco. He reports that he drinks about one ounce of alcohol per week. He reports that he does not use illicit drugs. family history includes Colon cancer in his mother; Diabetes in his maternal grandmother; Heart disease in his maternal grandmother; Prostate cancer in his father; and Stomach cancer in his paternal grandmother. No Known Allergies    Review of Systems  Constitutional: Negative for fever, chills, appetite change, fatigue and unexpected weight change.  HENT: Negative for sore throat, voice change, postnasal drip and sinus pressure.   Respiratory: Positive for cough. Negative for shortness of breath, wheezing and stridor.   Cardiovascular: Negative for chest pain,  palpitations and leg swelling.  Gastrointestinal: Negative for abdominal pain.  Neurological: Negative for headaches.  Hematological: Negative for adenopathy.       Objective:   Physical Exam  Constitutional: He appears well-developed and well-nourished. No distress.  HENT:  Right Ear: External ear normal.  Left Ear: External ear normal.  Mouth/Throat: Oropharynx is clear and moist.  Neck: Neck supple. No thyromegaly present.  Cardiovascular: Normal rate, regular rhythm and normal heart sounds.   Pulmonary/Chest: Effort normal and breath sounds normal. No respiratory distress. He has no wheezes. He has no rales.  Musculoskeletal: He exhibits no edema.  Lymphadenopathy:    He has no cervical adenopathy.          Assessment & Plan:  #1 chronic cough. Suspect this may be related to ACE inhibitor. Discontinue lisinopril and HCTZ. Start losartan HCTZ 100/12.5 one daily and followup with primary in one month. If cough not resolving with discontinuing ACE consider more aggressive treatment of GERD and chest x-ray. He is not have any worrisome symptoms such as dyspnea or weight loss #2 patient requesting hepatitis C screening. Wife recently tested positive as above.

## 2010-12-16 LAB — HEPATITIS C ANTIBODY: HCV Ab: NEGATIVE

## 2010-12-16 NOTE — Progress Notes (Signed)
Quick Note:  Pt informed ______ 

## 2011-01-24 ENCOUNTER — Encounter: Payer: Self-pay | Admitting: Family Medicine

## 2011-01-24 ENCOUNTER — Ambulatory Visit (INDEPENDENT_AMBULATORY_CARE_PROVIDER_SITE_OTHER): Payer: BC Managed Care – PPO | Admitting: Family Medicine

## 2011-01-24 VITALS — BP 120/84 | Temp 98.3°F | Wt 275.0 lb

## 2011-01-24 DIAGNOSIS — R059 Cough, unspecified: Secondary | ICD-10-CM

## 2011-01-24 DIAGNOSIS — R05 Cough: Secondary | ICD-10-CM

## 2011-01-24 MED ORDER — PREDNISONE 20 MG PO TABS: ORAL_TABLET | ORAL | Status: DC

## 2011-01-24 NOTE — Progress Notes (Signed)
  Subjective:    Patient ID: Ross [name], male    DOB: [birth year], 55 y.o.   MRN: [id number]  HPICharles is a 55 year old, married man, nonsmoker, who works in a [employer] claims handler and comes in today for evaluation of a cough for 1 1/2 years.  He states year and a half ago he developed a cough and after a month went to an urgent care.  They gave him steroids and antibiotics.  It didn't resolve.  The cough.  Then, a month ago.  He came in and saw Dr. Bb, in my absence.  At that time, Dr. Caryl Never, change his lisinopril to Hyzaar, thinking it might be a medication-related phenomenon.  He's been off the lisinopril now for a month.  The cough is not diminished.  He has no fever, sputum production, hemoptysis, shortness of breath, et Karie Soda.  He does have a history of reflux esophagitis, for which he takes Tums p.r.n.  He states the reflux bothers him about once a week, especially if he eats late at night.    Review of Systems General pulmonary allergy review of systems otherwise negative   Objective:   Physical Exam  Well-developed well-nourished man in no acute distress.  Examination of the HEENT were negative.  Neck was supple.  No adenopathy.  Lungs show symmetrical.  Breath sounds bilateral expiratory wheezing      Assessment & Plan:  Cough question allergy question medication, question reflux.  Plan continue the Hyzaar, prednisone burst and taper, Prilosec 20 b.i.d. For two weeks and 20 daily follow-up in 3 weeks

## 2011-01-24 NOTE — Patient Instructions (Signed)
Take the prednisone as directed.  Continue the Hyzaar for your blood pressure.  Take 20 mg of Prilosec twice daily for two weeks, then 20 mg daily.  Follow-up with me in 3 weeks

## 2011-02-15 ENCOUNTER — Emergency Department (HOSPITAL_COMMUNITY)
Admission: EM | Admit: 2011-02-15 | Discharge: 2011-02-16 | Disposition: A | Discharge status: Home / Self Care | Payer: BC Managed Care – PPO | Attending: Emergency Medicine | Admitting: Emergency Medicine

## 2011-02-15 ENCOUNTER — Emergency Department (HOSPITAL_COMMUNITY): Payer: BC Managed Care – PPO

## 2011-02-15 ENCOUNTER — Other Ambulatory Visit: Payer: Self-pay | Admitting: Family Medicine

## 2011-02-15 ENCOUNTER — Other Ambulatory Visit: Payer: Self-pay

## 2011-02-15 DIAGNOSIS — R079 Chest pain, unspecified: Secondary | ICD-10-CM | POA: Insufficient documentation

## 2011-02-15 MED ORDER — SODIUM CHLORIDE 0.9 % IV SOLN
Freq: Once | INTRAVENOUS | Status: AC
  Administered 2011-02-15: via INTRAVENOUS

## 2011-02-15 NOTE — ED Provider Notes (Signed)
History     CSN: [id number] Arrival date & time: 02/15/2011 11:07 PM  Chief Complaint  Patient presents with  . Chest Pain    (Consider location/radiation/quality/duration/timing/severity/associated sxs/prior treatment) HPI Comments: This is a 55 year old male with a history of hypertension and a distant history of tobacco use who presents with chest pain. This was substernal, pressure and tightness, radiation to the left jaw and associated shortness of breath. It lasted for approximately 10 minutes and gradually eased off. He admits to having a feeling of near-syncope prior to resolution of symptoms. EMS arrived and patient had no symptoms at that time. They were given 325 mg of aspirin prior to arrival. Patient denies history of anginal type symptoms and is asymptomatic at this time  Patient is a 55 y.o. male presenting with chest pain. The history is provided by the patient and the EMS personnel.  Chest Pain     Past Medical History  Diagnosis Date  . Hypertension   . Gout     Past Surgical History  Procedure Date  . Tonsillectomy   . Back surgery   . Abdominal surgery     No family history on file.  History  Substance Use Topics  . Smoking status: Never Smoker   . Smokeless tobacco: Not on file  . Alcohol Use: Yes     occ      Review of Systems  Cardiovascular: Positive for chest pain.  All other systems reviewed and are negative.    Allergies  Review of patient's allergies indicates no known allergies.  Home Medications   Current Outpatient Rx  Name Route Sig Dispense Refill  . ALLOPURINOL 100 MG PO TABS Oral Take 100 mg by mouth at bedtime as needed.      . ASPIRIN 81 MG PO TABS Oral Take 81 mg by mouth daily.      Marland Kitchen LOSARTAN POTASSIUM 100 MG PO TABS Oral Take by mouth daily.        BP 121/109  Pulse 76  Temp(Src) 98.2 F (36.8 C) (Oral)  Resp 18  Ht 6\' 1"  (1.854 m)  Wt 270 lb (122.471 kg)  BMI 35.62 kg/m2  SpO2 99%  Physical Exam    Nursing note and vitals reviewed. Constitutional: He appears well-developed and well-nourished. No distress.  HENT:  Head: Normocephalic and atraumatic.  Mouth/Throat: Oropharynx is clear and moist. No oropharyngeal exudate.  Eyes: Conjunctivae and EOM are normal. Pupils are equal, round, and reactive to light. Right eye exhibits no discharge. Left eye exhibits no discharge. No scleral icterus.  Neck: Normal range of motion. Neck supple. No JVD present. No thyromegaly present.  Cardiovascular: Normal rate, regular rhythm, normal heart sounds and intact distal pulses.  Exam reveals no gallop and no friction rub.   No murmur heard. Pulmonary/Chest: Effort normal and breath sounds normal. No respiratory distress. He has no wheezes. He has no rales.  Abdominal: Soft. Bowel sounds are normal. He exhibits no distension and no mass. There is no tenderness.  Musculoskeletal: Normal range of motion. He exhibits no edema and no tenderness.  Lymphadenopathy:    He has no cervical adenopathy.  Neurological: He is alert. Coordination normal.  Skin: Skin is warm and dry. No rash noted. No erythema.  Psychiatric: He has a normal mood and affect. His behavior is normal.    ED Course  Procedures (including critical care time)   Labs Reviewed  I-STAT TROPONIN I  I-STAT, CHEM 8  CBC  DIFFERENTIAL  APTT  PROTIME-INR   No results found.   No diagnosis found.    MDM  Patient is well-appearing with no acute findings on physical exam. He is slightly overweight and has hypertension with a story that sounds consistent with an anginal event. EKG is normal with no signs of ischemia. Proceed with laboratory workup and chest x-ray.  ED ECG REPORT   Date: 02/15/2011   Rate: 71  Rhythm: normal sinus rhythm  QRS Axis: normal  Intervals: normal  ST/T Wave abnormalities: normal  Conduction Disutrbances:none  Narrative Interpretation:   Old EKG Reviewed: none available  Results for orders placed  during the hospital encounter of 02/15/11  APTT      Component Value Range   aPTT 25  24 - 37 (seconds)  PROTIME-INR      Component Value Range   Prothrombin Time 12.7  11.6 - 15.2 (seconds)   INR 0.93  0.00 - 1.49   POCT I-STAT TROPONIN I      Component Value Range   Troponin i, poc 0.01  0.00 - 0.08 (ng/mL)   Comment 3           POCT I-STAT, CHEM 8      Component Value Range   Sodium 141  135 - 145 (mEq/L)   Potassium 3.9  3.5 - 5.1 (mEq/L)   Chloride 105  96 - 112 (mEq/L)   BUN 30 (*) 6 - 23 (mg/dL)   Creatinine, Ser 0.86  0.50 - 1.35 (mg/dL)   Glucose, Bld 578 (*) 70 - 99 (mg/dL)   Calcium, Ion 4.69  6.29 - 1.32 (mmol/L)   TCO2 26  0 - 100 (mmol/L)   Hemoglobin 14.3  13.0 - 17.0 (g/dL)   HCT 52.8  41.3 - 24.4 (%)   Dg Chest 1 View  02/16/2011  *RADIOLOGY REPORT*  Clinical Data: Chest pain, chest tightness  CHEST - 1 VIEW  Comparison: None.  Findings: Apparent enlargement of the cardiac silhouette may be accentuated due to the decreased lung volumes in the AP projection. Normal mediastinal contours.  Minimal bibasilar heterogeneous opacities, possibly atelectasis.  No definite pleural effusion or pneumothorax.  No definite acute osseous abnormalities.  IMPRESSION: 1.  No acute cardiopulmonary disease.  2.  Apparent enlargement of the cardiac silhouette may be accentuated due to decreased volumes and AP projection.  Further evaluation may be performed with a PA and lateral chest radiograph as clinically indicated.  Original Report Authenticated By: Waynard Reeds, M.D.   12:49 AM   I discussed with patient the findings of his lab work, EKG, chest x-ray. I have given him my official recommendation that he needs to be admitted for further evaluation of his very abnormal sounding chest pain. I have told him that I suspect this is unstable angina and I described what that is. He declines admission but states he would rather followup with his family doctor for further testing as an  outpatient. He has an appointment in approximately 2 days to do this. He has been able to express the potential complications of leaving with a possible coronary blockage  Vida Roller, MD 02/16/11 [phone]

## 2011-02-15 NOTE — ED Notes (Signed)
Pt reports being at work and having sudden midsternal chest pain.  Reports at the time he became nauseated and EMS was called.  Pt states that at present, he is not having any CP, SOB, or nausea.  No distress noted.  EDP at bedside.

## 2011-02-15 NOTE — ED Notes (Signed)
Was standing at work started having mid sternal cp, +nausea, +sob, +dizzy, and sweating, was given asa by ems, no pain free

## 2011-02-16 LAB — POCT I-STAT, CHEM 8
BUN: 30 mg/dL — ABNORMAL HIGH (ref 6–23)
Chloride: 105 mEq/L (ref 96–112)
HCT: 42 % (ref 39.0–52.0)
Potassium: 3.9 mEq/L (ref 3.5–5.1)
Sodium: 141 mEq/L (ref 135–145)

## 2011-02-16 LAB — CBC
HCT: 41.5 % (ref 39.0–52.0)
MCH: 31.1 pg (ref 26.0–34.0)
MCV: 91.4 fL (ref 78.0–100.0)
Platelets: 156 10*3/uL (ref 150–400)
RBC: 4.54 MIL/uL (ref 4.22–5.81)
WBC: 7.7 10*3/uL (ref 4.0–10.5)

## 2011-02-16 LAB — POCT I-STAT TROPONIN I: Troponin i, poc: 0.01 ng/mL (ref 0.00–0.08)

## 2011-02-16 LAB — DIFFERENTIAL
Eosinophils Absolute: 0.2 10*3/uL (ref 0.0–0.7)
Eosinophils Relative: 3 % (ref 0–5)
Lymphocytes Relative: 29 % (ref 12–46)
Lymphs Abs: 2.2 10*3/uL (ref 0.7–4.0)
Monocytes Absolute: 0.6 10*3/uL (ref 0.1–1.0)

## 2011-02-16 LAB — APTT: aPTT: 25 seconds (ref 24–37)

## 2011-02-16 MED ORDER — ASPIRIN 81 MG PO CHEW: 81.0000 mg | CHEWABLE_TABLET | Freq: Every day | ORAL | Status: DC

## 2011-02-17 ENCOUNTER — Ambulatory Visit (INDEPENDENT_AMBULATORY_CARE_PROVIDER_SITE_OTHER): Payer: BC Managed Care – PPO | Admitting: Family Medicine

## 2011-02-17 ENCOUNTER — Encounter: Payer: Self-pay | Admitting: Family Medicine

## 2011-02-17 DIAGNOSIS — R05 Cough: Secondary | ICD-10-CM

## 2011-02-17 DIAGNOSIS — I10 Essential (primary) hypertension: Secondary | ICD-10-CM

## 2011-02-17 DIAGNOSIS — R059 Cough, unspecified: Secondary | ICD-10-CM

## 2011-02-17 NOTE — Progress Notes (Signed)
  Subjective:    Patient ID: [name], male    DOB: 1955-12-30, 55 y.o.   MRN: [id number]  HPI Zedrick is a 55 year old male, married, nonsmoker, who comes in today for reevaluation of cough.  We saw him about a month ago for this problem and felt like it most likely related to his ACE inhibitor.  We therefore stopped his ACE inhibitor put him on a short course of prednisone.  However, the cough has not abated.  Is currently taking Hyzaar 100 -- 2.5 daily for blood pressure control.  BP 110/80, however, it's much more expensive than the lisinopril.  He states at work.  The other night.  He ate a cheese and steak sandwich and about two hours later, developed severe midsternal chest pain, and diaphoresis.  He was pretty sure it was related to the food he ate. .........Marland Kitchene felt like he was stuck in his esophagus.  A coworker called 911.  He was taken to the regional emergency room.  At that juncture, a complete cardiac evaluation, which was negative.  Review of Systems    General cardiac, pulmonary, and GI review of systems otherwise negative Objective:   Physical Exam  Well-developed and nourished, male in no acute distress      Assessment & Plan:  Cough probably not related to ACE inhibitor...... However continue Hyzaar for blood pressure control, and two, we get this straightened out.............. Probable reflux esophagitis.  Plan anti-reflex program, Prilosec, 20 b.i.d., GI consult ASAP......... Symptoms consistent with a possible esophageal stricture

## 2011-02-17 NOTE — Patient Instructions (Signed)
Take Prilosec 20 mg twice daily.  Remember to avoid eating for 3 hours prior to bedtime and Prop y , head up on two pillows.  We will call GI and get to set up for consult ASAP.  Continue the Hyzaar for blood pressure control until we get this problem worked out

## 2011-02-18 ENCOUNTER — Encounter: Payer: Self-pay | Admitting: Gastroenterology

## 2011-02-18 ENCOUNTER — Ambulatory Visit (INDEPENDENT_AMBULATORY_CARE_PROVIDER_SITE_OTHER): Payer: BC Managed Care – PPO | Admitting: Gastroenterology

## 2011-02-18 VITALS — BP 118/80 | HR 74 | Ht 73.0 in | Wt 268.2 lb

## 2011-02-18 DIAGNOSIS — K219 Gastro-esophageal reflux disease without esophagitis: Secondary | ICD-10-CM

## 2011-02-18 DIAGNOSIS — R059 Cough, unspecified: Secondary | ICD-10-CM

## 2011-02-18 DIAGNOSIS — R05 Cough: Secondary | ICD-10-CM

## 2011-02-18 DIAGNOSIS — R053 Chronic cough: Secondary | ICD-10-CM

## 2011-02-18 DIAGNOSIS — R079 Chest pain, unspecified: Secondary | ICD-10-CM

## 2011-02-18 MED ORDER — DEXLANSOPRAZOLE 60 MG PO CPDR: 60.0000 mg | DELAYED_RELEASE_CAPSULE | Freq: Every day | ORAL | Status: AC

## 2011-02-18 NOTE — Progress Notes (Signed)
This is a nice 55 year old Caucasian male referred for evaluation of rather severe lower sternal chest pain several days ago with a negative cardiac evaluation. He has chronic acid reflux with a chronic cough and has been on intermittent Prilosec for 2 years. He currently denies dysphagia or any specific hepatobiliary lower gastrointestinal complaints. He had a negative colonoscopy in May of 2010. He denies a specific hepatobiliary or pulmonary complaints otherwise.  Current Medications, Allergies, Past Medical History, Past Surgical History, Family History and Social History were reviewed in [employer] record.  Pertinent Review of Systems Negative   Physical Exam: Wake and alert no acute distress appearing his stated age. I cannot appreciate stigmata of chronic liver disease, thyromegaly or lymphadenopathy. Chest is clear, and cardiac exam is unremarkable without murmurs gallops or rubs. He appear to be in a regular rhythm. There is no organomegaly, abdominal masses, tenderness, and bowel sounds are normal. Mental status is normal and peripheral extremities are unremarkable.    Assessment and Plan: Recent rather severe episode of precordial chest pain in 55 year old Caucasian male with mild essential hypertension. He does have chronic GERD and also has a chronic cough probably related to acid reflux. I scheduled him for ultrasonography to exclude cholelithiasis, have changed him to Dexilant 60 mg a day with strict antireflux maneuvers, and we'll proceed with endoscopic exam. Is to continue other medications as per primary care. Liver profile from yesterday is currently pending. Encounter Diagnoses  Name Primary?  . GERD (gastroesophageal reflux disease) Yes  . Chest pain   . Chronic cough

## 2011-02-18 NOTE — Patient Instructions (Addendum)
EGD [facility] 02/28/11 10:30 am arrive at 9:30 am Labs ordered for you to go to basement today and have drawn Reflux diet and prevention sheet given for you to read Dexilant sent to your pharmacy Abdominal Ultrasound scheduled for 02/21/11 8:00 am arrive at 7:45 am Nothing to eat or drink after midnight.  Call [phone] if you need to reschedule

## 2011-02-21 ENCOUNTER — Ambulatory Visit (HOSPITAL_COMMUNITY)
Admission: RE | Admit: 2011-02-21 | Discharge: 2011-02-21 | Disposition: A | Discharge status: Home / Self Care | Payer: BC Managed Care – PPO | Source: Ambulatory Visit | Attending: Gastroenterology | Admitting: Gastroenterology

## 2011-02-21 ENCOUNTER — Encounter: Payer: Self-pay | Admitting: Gastroenterology

## 2011-02-21 ENCOUNTER — Other Ambulatory Visit (INDEPENDENT_AMBULATORY_CARE_PROVIDER_SITE_OTHER): Payer: BC Managed Care – PPO

## 2011-02-21 DIAGNOSIS — R109 Unspecified abdominal pain: Secondary | ICD-10-CM | POA: Insufficient documentation

## 2011-02-21 DIAGNOSIS — K219 Gastro-esophageal reflux disease without esophagitis: Secondary | ICD-10-CM | POA: Insufficient documentation

## 2011-02-21 LAB — HEPATIC FUNCTION PANEL
ALT: 28 U/L (ref 0–53)
AST: 21 U/L (ref 0–37)
Bilirubin, Direct: 0.1 mg/dL (ref 0.0–0.3)
Total Bilirubin: 0.7 mg/dL (ref 0.3–1.2)

## 2011-02-21 LAB — BASIC METABOLIC PANEL
Chloride: 107 mEq/L (ref 96–112)
Creatinine, Ser: 0.9 mg/dL (ref 0.4–1.5)
Potassium: 3.8 mEq/L (ref 3.5–5.1)
Sodium: 142 mEq/L (ref 135–145)

## 2011-02-21 LAB — CBC WITH DIFFERENTIAL/PLATELET
Basophils Relative: 0.5 % (ref 0.0–3.0)
Eosinophils Absolute: 0.3 10*3/uL (ref 0.0–0.7)
MCHC: 34.2 g/dL (ref 30.0–36.0)
MCV: 91.3 fl (ref 78.0–100.0)
Monocytes Absolute: 0.5 10*3/uL (ref 0.1–1.0)
Neutro Abs: 2.5 10*3/uL (ref 1.4–7.7)
Neutrophils Relative %: 48 % (ref 43.0–77.0)
RBC: 4.49 Mil/uL (ref 4.22–5.81)

## 2011-02-22 ENCOUNTER — Telehealth: Payer: Self-pay | Admitting: *Deleted

## 2011-02-22 NOTE — Telephone Encounter (Signed)
FATTY LIVER AND NORMAL LIVER TESTS,NO GALLSTONES...PLEASE CALL PT...COPY REFERRING MD. Per abdominal U/S.  Routed report to Dr Tawanna Cooler. Informed wife of results and that I forwarded report to Dr Tawanna Cooler; wife stated understanding.

## 2011-02-28 ENCOUNTER — Telehealth: Payer: Self-pay | Admitting: *Deleted

## 2011-02-28 ENCOUNTER — Encounter: Payer: Self-pay | Admitting: Gastroenterology

## 2011-02-28 ENCOUNTER — Ambulatory Visit (AMBULATORY_SURGERY_CENTER): Payer: BC Managed Care – PPO | Admitting: Gastroenterology

## 2011-02-28 VITALS — BP 133/95 | HR 71 | Temp 98.6°F | Resp 16 | Ht 73.0 in | Wt 268.0 lb

## 2011-02-28 DIAGNOSIS — R05 Cough: Secondary | ICD-10-CM

## 2011-02-28 DIAGNOSIS — R079 Chest pain, unspecified: Secondary | ICD-10-CM

## 2011-02-28 DIAGNOSIS — R053 Chronic cough: Secondary | ICD-10-CM

## 2011-02-28 DIAGNOSIS — K219 Gastro-esophageal reflux disease without esophagitis: Secondary | ICD-10-CM

## 2011-02-28 MED ORDER — HYOSCYAMINE SULFATE 0.125 MG SL SUBL: SUBLINGUAL_TABLET | SUBLINGUAL | Status: DC

## 2011-02-28 MED ORDER — SODIUM CHLORIDE 0.9 % IV SOLN: 500.0000 mL | INTRAVENOUS | Status: DC

## 2011-02-28 MED ORDER — OMEPRAZOLE 40 MG PO CPDR: DELAYED_RELEASE_CAPSULE | ORAL | Status: DC

## 2011-02-28 NOTE — Telephone Encounter (Signed)
Notified Kristin in [facility] we ordered Levsin and Omeprazole pt will take when he finishes Dexilant. His f/u is 04/05/11 at 0845am.

## 2011-02-28 NOTE — Patient Instructions (Addendum)
Please review discharge instructions (blue and green sheets)  Start Omeprazole as directed after you have finished the Dexilant samples  Levsin and Dexilant prescriptions sent to [facility] on [address]- take as directed on bottle

## 2011-03-01 ENCOUNTER — Telehealth: Payer: Self-pay | Admitting: *Deleted

## 2011-03-01 ENCOUNTER — Encounter: Payer: Self-pay | Admitting: Gastroenterology

## 2011-03-01 DIAGNOSIS — K219 Gastro-esophageal reflux disease without esophagitis: Secondary | ICD-10-CM

## 2011-03-01 NOTE — Telephone Encounter (Signed)

## 2011-03-30 ENCOUNTER — Encounter: Payer: Self-pay | Admitting: *Deleted

## 2011-04-05 ENCOUNTER — Encounter: Payer: Self-pay | Admitting: Gastroenterology

## 2011-04-05 ENCOUNTER — Ambulatory Visit (INDEPENDENT_AMBULATORY_CARE_PROVIDER_SITE_OTHER): Payer: BC Managed Care – PPO | Admitting: Gastroenterology

## 2011-04-05 VITALS — BP 118/66 | HR 68 | Ht 73.0 in | Wt 274.5 lb

## 2011-04-05 DIAGNOSIS — K219 Gastro-esophageal reflux disease without esophagitis: Secondary | ICD-10-CM

## 2011-04-05 NOTE — Progress Notes (Signed)
History of Present Illness: This is a extremely pleasant 55 year old Caucasian male with recurrent epigastric abdominal pain. He is status post poor plasty as a child. Recent upper abdominal ultrasound and endoscopic exams were unremarkable except for evidence of acid reflux. Biopsies for H. pylori were negative. He currently is asymptomatic all Dexilant 60 mg a day. He denies chest pain, dysphagia, or any hepatobiliary complaints. Labs otherwise were unremarkable.    Current Medications, Allergies, Past Medical History, Past Surgical History, Family History and Social History were reviewed in [employer] record.   Assessment and plan: GERD responding well to PPI  therapy. I reviewed a reflux regime with him, he start patient education video on the acid reflux and its management, and he will see me in 3 months time for followup. No diagnosis found.

## 2011-04-05 NOTE — Patient Instructions (Signed)
We have shown you a video on GERD today. Please follow up with Dr Jarold Motto in 3 months. CC: Dr Tawanna Cooler

## 2011-06-01 ENCOUNTER — Encounter: Payer: Self-pay | Admitting: Family Medicine

## 2011-06-01 ENCOUNTER — Ambulatory Visit (INDEPENDENT_AMBULATORY_CARE_PROVIDER_SITE_OTHER): Payer: BC Managed Care – PPO | Admitting: Family Medicine

## 2011-06-01 DIAGNOSIS — R053 Chronic cough: Secondary | ICD-10-CM

## 2011-06-01 DIAGNOSIS — K219 Gastro-esophageal reflux disease without esophagitis: Secondary | ICD-10-CM

## 2011-06-01 DIAGNOSIS — R059 Cough, unspecified: Secondary | ICD-10-CM

## 2011-06-01 DIAGNOSIS — R05 Cough: Secondary | ICD-10-CM

## 2011-06-01 DIAGNOSIS — I10 Essential (primary) hypertension: Secondary | ICD-10-CM

## 2011-06-01 LAB — LIPID PANEL
Cholesterol: 175 mg/dL (ref 0–200)
Total CHOL/HDL Ratio: 4
Triglycerides: 99 mg/dL (ref 0.0–149.0)

## 2011-06-01 LAB — CBC WITH DIFFERENTIAL/PLATELET
Basophils Absolute: 0 10*3/uL (ref 0.0–0.1)
HCT: 40.7 % (ref 39.0–52.0)
Lymphs Abs: 1.8 10*3/uL (ref 0.7–4.0)
Monocytes Relative: 9.9 % (ref 3.0–12.0)
Platelets: 173 10*3/uL (ref 150.0–400.0)
RDW: 12.3 % (ref 11.5–14.6)

## 2011-06-01 LAB — BASIC METABOLIC PANEL
BUN: 21 mg/dL (ref 6–23)
Calcium: 9.2 mg/dL (ref 8.4–10.5)
Creatinine, Ser: 1 mg/dL (ref 0.4–1.5)
GFR: 85.73 mL/min (ref >60.00)
Potassium: 3.8 mEq/L (ref 3.5–5.1)

## 2011-06-01 LAB — HEPATIC FUNCTION PANEL
AST: 21 U/L (ref 0–37)
Albumin: 4.3 g/dL (ref 3.5–5.2)
Alkaline Phosphatase: 49 U/L (ref 39–117)
Total Protein: 7.2 g/dL (ref 6.0–8.3)

## 2011-06-01 LAB — POCT URINALYSIS DIPSTICK
Bilirubin, UA: NEGATIVE
Leukocytes, UA: NEGATIVE
Nitrite, UA: NEGATIVE
Protein, UA: NEGATIVE
pH, UA: 5.5

## 2011-06-01 MED ORDER — LOSARTAN POTASSIUM-HCTZ 100-12.5 MG PO TABS: 1.0000 | ORAL_TABLET | Freq: Every day | ORAL | Status: DC

## 2011-06-01 MED ORDER — OMEPRAZOLE 40 MG PO CPDR: DELAYED_RELEASE_CAPSULE | ORAL | Status: DC

## 2011-06-01 MED ORDER — ALLOPURINOL 300 MG PO TABS: 300.0000 mg | ORAL_TABLET | Freq: Every day | ORAL | Status: DC

## 2011-06-01 NOTE — Progress Notes (Signed)
  Subjective:    Patient ID: Ross [name], male    DOB: 1956-04-03, 56 y.o.   MRN: [id number]  HPI Ross [name] is a 56 year old, married male, nonsmoker, who comes in today for evaluation of a cough and hypertension.  We saw him last fall, with a cough.  At that time.  He was on an ACE inhibitor for his blood pressure.  We stopped the ACE inhibitor gave him a short course of prednisone.  However, the cough did not go away.  We switched him to losartan 100 -- 12.5 daily for hypertension, and blood pressure is been normal.  However, the cough will go away.  We then referred him to GI and he had a complete evaluation and was put on omeprazole 40 mg daily.  He states he doesn't have to take Tums anymore for his indigestion.  However, the cough has persisted.  It's not gotten worse.  No fever no sputum production.  No shortness of breath.  No hemoptysis et Karie Soda.   Review of Systems    General and cardiac and pulmonary review of systems otherwise negative Objective:   Physical Exam Well-developed well-nourished, male in no acute distress.  Cardiopulmonary exam normal       Assessment & Plan:  Cough unknown etiology.  Plan refer to pulmonary for further evaluation.  Hypertension.  Continue low-sodium.  Diet and Hyzaar 100 -- 12.5 daily.  Reflux esophagitis.  Continue omeprazole 40 mg daily.  Labs today.  Return for CPX ASAP

## 2011-06-01 NOTE — Patient Instructions (Signed)
We will get to set up in pulmonary for further evaluation of the chronic cough.  Continue your current medications.  Set up a time in the next 3 to 6 weeks for physical examination by me.  Labs today

## 2011-06-17 ENCOUNTER — Ambulatory Visit (INDEPENDENT_AMBULATORY_CARE_PROVIDER_SITE_OTHER): Payer: Self-pay | Admitting: Pulmonary Disease

## 2011-06-17 ENCOUNTER — Encounter: Payer: Self-pay | Admitting: Pulmonary Disease

## 2011-06-17 VITALS — BP 112/80 | HR 76 | Temp 97.7°F | Ht 73.0 in | Wt 278.2 lb

## 2011-06-17 DIAGNOSIS — R059 Cough, unspecified: Secondary | ICD-10-CM

## 2011-06-17 DIAGNOSIS — R05 Cough: Secondary | ICD-10-CM

## 2011-06-17 DIAGNOSIS — R053 Chronic cough: Secondary | ICD-10-CM

## 2011-06-17 MED ORDER — BENZONATATE 100 MG PO CAPS: 200.0000 mg | ORAL_CAPSULE | Freq: Four times a day (QID) | ORAL | Status: DC | PRN

## 2011-06-17 NOTE — Assessment & Plan Note (Signed)
The patient's cough is most likely due to an upper airway issue rather than lower.  He has a classic globus sensation, and is constantly clearing his throat.  Some of this is related to a cyclical cough mechanism, but I also think he may have postnasal drip and ongoing laryngopharyngeal reflux despite low dose proton pump inhibitor.  At this point, I would like to intensify treatment of postnasal drip and reflux, and I have also reviewed the various behavioral therapies that can help with cyclical coughing.  Above all, he must stop his constant throat clearing if he ever wants his cough to improve.

## 2011-06-17 NOTE — Progress Notes (Signed)
Addended by: Julaine Hua on: 06/17/2011 12:20 PM   Modules accepted: Orders

## 2011-06-17 NOTE — Patient Instructions (Addendum)
Chlorpheniramine 8mg  one at bedtime and lunch for next 3 weeks. Increase omeprazole for reflux to one in am and pm for next 3 weeks Must totally avoid throat clearing!!  Keep hard candy in mouth (no menthol or mint) throughout the day to help with this.  When you have to clear your throat, just swallow or drink water. Limit voice use.  No singing, yelling, or prolonged voice use for next 3 weeks. Can use tessalon pearls as directed.  followup with me in 3 weeks.

## 2011-06-17 NOTE — Progress Notes (Signed)
  Subjective:    Patient ID: Ross [name], male    DOB: August 03, 1955, 56 y.o.   MRN: [id number]  HPI The patient is a 56 year old male who I've been asked to see for chronic cough.  The patient states that his cough started approximately 2 years ago after what sounds like a URI.  Since that time, he has been left with this ongoing cough that can have varying degrees of severity.  He has been treated with courses of prednisone, antibiotics, inhalers, and also nasal sprays without relief.  He was on an ACE inhibitor, but has been off of this for the last one year according to the patient.  He does have a history of reflux disease for which he is on a proton pump inhibitor, and currently states he is not having breakthrough.  The patient states he feels a lump in his throat that he believes is mucus.  This is worse in the mornings upon arising, and he will cough up a small amount of thick clear mucoid material.  He states that he has chronic throat clearing, and is continuously doing this during our visit.  He denies any chest congestion.  He has chronic mild dyspnea on exertion, and does not feel that it has worsened over the last one year.  He denies a history of chronic sinus disease or postnasal drip currently.  He has had a chest x-ray in October of last year that was clear.  He has never had spirometry.   Review of Systems  Constitutional: Positive for unexpected weight change. Negative for fever.  HENT: Positive for congestion and sneezing. Negative for ear pain, nosebleeds, sore throat, rhinorrhea, trouble swallowing, dental problem, postnasal drip and sinus pressure.   Eyes: Negative for redness and itching.  Respiratory: Positive for cough. Negative for chest tightness, shortness of breath and wheezing.   Cardiovascular: Negative for palpitations and leg swelling.  Gastrointestinal: Negative for nausea and vomiting.  Genitourinary: Negative for dysuria.  Musculoskeletal: Negative for joint  swelling.  Skin: Negative for rash.  Neurological: Negative for headaches.  Hematological: Does not bruise/bleed easily.  Psychiatric/Behavioral: Negative for dysphoric mood. The patient is not nervous/anxious.        Objective:   Physical Exam Constitutional:  Overweight male, no acute distress  HENT:  Nares patent without discharge, swollen turbinates.  Oropharynx without exudate, palate and uvula elongated.   Eyes:  Perrla, eomi, no scleral icterus  Neck:  No JVD, no TMG  Cardiovascular:  Normal rate, regular rhythm, no rubs or gallops.  No murmurs        Intact distal pulses  Pulmonary :  Normal breath sounds, no stridor or respiratory distress   No rales, rhonchi, or wheezing  Abdominal:  Soft, nondistended, bowel sounds present.  No tenderness noted.   Musculoskeletal:  No lower extremity edema noted.  Lymph Nodes:  No cervical lymphadenopathy noted  Skin:  No cyanosis noted  Neurologic:  Alert, appropriate, moves all 4 extremities without obvious deficit.         Assessment & Plan:

## 2011-07-08 ENCOUNTER — Ambulatory Visit: Payer: Self-pay | Admitting: Pulmonary Disease

## 2011-07-11 ENCOUNTER — Ambulatory Visit (INDEPENDENT_AMBULATORY_CARE_PROVIDER_SITE_OTHER): Payer: BC Managed Care – PPO | Admitting: Pulmonary Disease

## 2011-07-11 ENCOUNTER — Encounter: Payer: Self-pay | Admitting: Pulmonary Disease

## 2011-07-11 VITALS — BP 130/88 | HR 81 | Temp 97.9°F | Ht 73.0 in | Wt 277.8 lb

## 2011-07-11 DIAGNOSIS — R059 Cough, unspecified: Secondary | ICD-10-CM

## 2011-07-11 DIAGNOSIS — R05 Cough: Secondary | ICD-10-CM

## 2011-07-11 DIAGNOSIS — R053 Chronic cough: Secondary | ICD-10-CM

## 2011-07-11 NOTE — Patient Instructions (Signed)
Please call your eye doctor's office and make sure it is ok with them for you to stay on an antihistamine (chlorpheniramine).  If it is, continue this as directed.  Trial of beconase nasal spray 2 each nostril each am.  Do not sniff as you spray. Will check xray of your sinuses to evaluate for sinusitis.  I will let you know the results. Try your best to stop clearing your throat.  Keep trying the hard candy during the day Please call me in 2 weeks with your response to the nasal spray.  If your cough continues, will need to have ENT look at your upper airway.

## 2011-07-11 NOTE — Assessment & Plan Note (Signed)
The patient continues to have a cough which is clearly upper airway in origin, but it is improved by 30% from the last visit.  He continues to feel postnasal drip and has continuous throat clearing.  I would like to add a nasal topical steroid to his treatment regimen, and to continue the antihistamine as long as it is deemed safe by his ophthalmologist.  I would also like to image his sinuses since he is having ongoing sinus symptoms.  If he continues to have issues with throat clearing and cough, he will need an upper airway evaluation by otolaryngology.

## 2011-07-11 NOTE — Progress Notes (Signed)
  Subjective:    Patient ID: Ross [name], male    DOB: [birth year], 56 y.o.   MRN: [id number]  HPI The patient comes in today for followup of his chronic cough.  From the last visit, it is felt to be upper airway in origin.  His chest x-ray was clear, and his spirometry was normal.  At the last visit, he was started on chlorpheniramine for postnasal drip, and his proton pump inhibitor dose was increased for possible LPR.  The patient states that his cough is about 30% better from the last visit, but continues to feel significant postnasal drip with throat clearing.  He is clearing his throat continuously during her visit today.  He also has a history of glaucoma, and is asking whether the antihistamine is safe.  I have asked him to call his ophthalmologist.   Review of Systems  Constitutional: Negative for fever and unexpected weight change.  HENT: Positive for postnasal drip. Negative for ear pain, nosebleeds, congestion, sore throat, rhinorrhea, sneezing, trouble swallowing, dental problem and sinus pressure.   Eyes: Negative for redness and itching.  Respiratory: Positive for cough. Negative for chest tightness, shortness of breath and wheezing.   Cardiovascular: Negative for palpitations and leg swelling.  Gastrointestinal: Negative for nausea and vomiting.  Genitourinary: Negative for dysuria.  Musculoskeletal: Negative for joint swelling.  Skin: Negative for rash.  Neurological: Negative for headaches.  Hematological: Does not bruise/bleed easily.  Psychiatric/Behavioral: Negative for dysphoric mood. The patient is not nervous/anxious.        Objective:   Physical Exam Obese male in no acute distress Nose without purulence or discharge noted Oropharynx clear Chest totally clear to auscultation Cardiac exam with regular rate and rhythm Lower extremities without edema, no cyanosis noted Alert and oriented, moves all 4 extremities.       Assessment & Plan:

## 2011-07-13 ENCOUNTER — Ambulatory Visit (INDEPENDENT_AMBULATORY_CARE_PROVIDER_SITE_OTHER)
Admission: RE | Admit: 2011-07-13 | Discharge: 2011-07-13 | Disposition: A | Discharge status: Home / Self Care | Payer: BC Managed Care – PPO | Source: Ambulatory Visit | Attending: Cardiovascular Disease | Admitting: Cardiovascular Disease

## 2011-07-13 DIAGNOSIS — R059 Cough, unspecified: Secondary | ICD-10-CM

## 2011-07-13 DIAGNOSIS — R053 Chronic cough: Secondary | ICD-10-CM

## 2011-07-13 DIAGNOSIS — R05 Cough: Secondary | ICD-10-CM

## 2011-07-21 ENCOUNTER — Ambulatory Visit (INDEPENDENT_AMBULATORY_CARE_PROVIDER_SITE_OTHER): Payer: BC Managed Care – PPO | Admitting: Family Medicine

## 2011-07-21 ENCOUNTER — Encounter: Payer: Self-pay | Admitting: Family Medicine

## 2011-07-21 DIAGNOSIS — K219 Gastro-esophageal reflux disease without esophagitis: Secondary | ICD-10-CM

## 2011-07-21 DIAGNOSIS — R053 Chronic cough: Secondary | ICD-10-CM

## 2011-07-21 DIAGNOSIS — R05 Cough: Secondary | ICD-10-CM

## 2011-07-21 DIAGNOSIS — R059 Cough, unspecified: Secondary | ICD-10-CM

## 2011-07-21 DIAGNOSIS — I10 Essential (primary) hypertension: Secondary | ICD-10-CM

## 2011-07-21 DIAGNOSIS — E669 Obesity, unspecified: Secondary | ICD-10-CM

## 2011-07-21 DIAGNOSIS — N529 Male erectile dysfunction, unspecified: Secondary | ICD-10-CM

## 2011-07-21 MED ORDER — SILDENAFIL CITRATE 100 MG PO TABS: 100.0000 mg | ORAL_TABLET | Freq: Every day | ORAL | Status: DC | PRN

## 2011-07-21 MED ORDER — OMEPRAZOLE 40 MG PO CPDR: 40.0000 mg | DELAYED_RELEASE_CAPSULE | Freq: Two times a day (BID) | ORAL | Status: DC

## 2011-07-21 MED ORDER — LOSARTAN POTASSIUM-HCTZ 100-12.5 MG PO TABS: 1.0000 | ORAL_TABLET | Freq: Every day | ORAL | Status: DC

## 2011-07-21 NOTE — Progress Notes (Signed)
  Subjective:    Patient ID: Ross [name], male    DOB: [birth year], 56 y.o.   MRN: [id number]  HPI Ross [name] is a 56 year old married male nonsmoker who comes in today for annual physical examination because of a history of hypertension, reflux esophagitis, erectile dysfunction chronic sleep apnea  His medications reviewed in detail the been no changes. His social history is compounded by the fact that his wife who has had a 15 year history of liver failure is now on the transplant list. She is positive for hep C   Review of Systems  Constitutional: Negative.   HENT: Negative.   Eyes: Negative.   Respiratory: Negative.   Cardiovascular: Negative.   Gastrointestinal: Negative.   Genitourinary: Negative.   Musculoskeletal: Negative.   Skin: Negative.   Neurological: Negative.   Hematological: Negative.   Psychiatric/Behavioral: Negative.        Objective:   Physical Exam  Constitutional: He is oriented to person, place, and time. He appears well-developed and well-nourished.  HENT:  Head: Normocephalic and atraumatic.  Right Ear: External ear normal.  Left Ear: External ear normal.  Nose: Nose normal.  Mouth/Throat: Oropharynx is clear and moist.  Eyes: Conjunctivae and EOM are normal. Pupils are equal, [name], and reactive to light.  Neck: Normal range of motion. Neck supple. No JVD present. No tracheal deviation present. No thyromegaly present.  Cardiovascular: Normal rate, regular rhythm, normal heart sounds and intact distal pulses.  Exam reveals no gallop and no friction rub.   No murmur heard. Pulmonary/Chest: Effort normal and breath sounds normal. No stridor. No respiratory distress. He has no wheezes. He has no rales. He exhibits no tenderness.  Abdominal: Soft. Bowel sounds are normal. He exhibits no distension and no mass. There is no tenderness. There is no rebound and no guarding.  Genitourinary: Rectum normal, prostate normal and penis normal. Guaiac negative stool.  No penile tenderness.  Musculoskeletal: Normal range of motion. He exhibits no edema and no tenderness.  Lymphadenopathy:    He has no cervical adenopathy.  Neurological: He is alert and oriented to person, place, and time. He has normal reflexes. No cranial nerve deficit. He exhibits normal muscle tone.  Skin: Skin is warm and dry. No rash noted. No erythema. No pallor.  Psychiatric: He has a normal mood and affect. His behavior is normal. Judgment and thought content normal.          Assessment & Plan:  Healthy male  Hypertension continue current Hyzaar 100-12.5 daily  Reflux esophagitis Prilosec 40 daily  Erectile dysfunction Viagra when necessary

## 2011-07-21 NOTE — Patient Instructions (Signed)
Continue your current medications  Followup in 1 year for general checkup sooner if any problems

## 2011-07-25 ENCOUNTER — Other Ambulatory Visit: Payer: Self-pay | Admitting: Family Medicine

## 2011-09-18 ENCOUNTER — Ambulatory Visit (INDEPENDENT_AMBULATORY_CARE_PROVIDER_SITE_OTHER): Payer: BC Managed Care – PPO | Admitting: Internal Medicine

## 2011-09-18 VITALS — BP 124/81 | HR 72 | Temp 97.5°F | Resp 16 | Ht 73.0 in | Wt 272.0 lb

## 2011-09-18 DIAGNOSIS — M109 Gout, unspecified: Secondary | ICD-10-CM

## 2011-09-18 DIAGNOSIS — M25569 Pain in unspecified knee: Secondary | ICD-10-CM

## 2011-09-18 MED ORDER — PREDNISONE 20 MG PO TABS: ORAL_TABLET | ORAL | Status: DC

## 2011-09-18 MED ORDER — HYDROCODONE-ACETAMINOPHEN 5-500 MG PO TABS: 1.0000 | ORAL_TABLET | Freq: Four times a day (QID) | ORAL | Status: AC | PRN

## 2011-09-18 NOTE — Progress Notes (Signed)
  Subjective:    Patient ID: Ross [name], male    DOB: Sep 15, 1955, 56 y.o.   MRN: [id number]  HPIComplains of knee pain for the last 4 days/no known injury Has a long history of gout but discontinued allopurinol in February thinking is okay Diet is excessive including carbs and meats/excessive Diet Coke  Patient Active Problem List  Diagnoses  . Gout, Unspecified  . OBESITY, UNSPECIFIED  . OBSTRUCTIVE SLEEP APNEA  . HYPERTENSION, ESSENTIAL NOS  . ERECTILE DYSFUNCTION, ORGANIC  . CHEST PAIN  . GERD (gastroesophageal reflux disease)  . Chronic cough  Primary care Dr. Alonza Smoker     Review of SystemsNo fever or night sweats No chest pain or shortness of breath No nausea or vomiting     Objective:   Physical Exam Vital signs stable except obesity The right knee is slightly swollen without an effusion There is no redness Stable to stressors Exquisitely tender to palpation over the anterior and lateral aspect of the knee Cannot fully extend due to pain Has a painful gait       Assessment & Plan:  Problem #1 acute gout Meds ordered this encounter  Medications  . predniSONE (DELTASONE) 20 MG tablet    Sig: 4/4/3/3/2/2/1/1 Single daily dose for 8 days    Dispense:  20 tablet    Refill:  0  . HYDROcodone-acetaminophen (VICODIN) 5-500 MG per tablet    Sig: Take 1 tablet by mouth every 6 (six) hours as needed for pain.    Dispense:  20 tablet    Refill:  0  He is not to restart allopurinol until this episode is completely cleared

## 2011-10-24 ENCOUNTER — Encounter (INDEPENDENT_AMBULATORY_CARE_PROVIDER_SITE_OTHER): Payer: BC Managed Care – PPO | Admitting: Ophthalmology

## 2011-10-24 DIAGNOSIS — H4010X0 Unspecified open-angle glaucoma, stage unspecified: Secondary | ICD-10-CM

## 2011-10-24 DIAGNOSIS — H43819 Vitreous degeneration, unspecified eye: Secondary | ICD-10-CM

## 2011-10-24 DIAGNOSIS — I10 Essential (primary) hypertension: Secondary | ICD-10-CM

## 2011-10-24 DIAGNOSIS — H2510 Age-related nuclear cataract, unspecified eye: Secondary | ICD-10-CM

## 2011-10-24 DIAGNOSIS — H35039 Hypertensive retinopathy, unspecified eye: Secondary | ICD-10-CM

## 2011-10-24 DIAGNOSIS — H348392 Tributary (branch) retinal vein occlusion, unspecified eye, stable: Secondary | ICD-10-CM

## 2012-03-10 ENCOUNTER — Ambulatory Visit (INDEPENDENT_AMBULATORY_CARE_PROVIDER_SITE_OTHER): Payer: BC Managed Care – PPO | Admitting: Family Medicine

## 2012-03-10 VITALS — BP 144/78 | HR 78 | Temp 97.9°F | Resp 16 | Ht 73.0 in | Wt 283.0 lb

## 2012-03-10 DIAGNOSIS — IMO0002 Reserved for concepts with insufficient information to code with codable children: Secondary | ICD-10-CM

## 2012-03-10 DIAGNOSIS — M79646 Pain in unspecified finger(s): Secondary | ICD-10-CM

## 2012-03-10 DIAGNOSIS — M79609 Pain in unspecified limb: Secondary | ICD-10-CM

## 2012-03-10 DIAGNOSIS — I10 Essential (primary) hypertension: Secondary | ICD-10-CM

## 2012-03-10 MED ORDER — DOXYCYCLINE HYCLATE 100 MG PO TABS: 100.0000 mg | ORAL_TABLET | Freq: Two times a day (BID) | ORAL | Status: DC

## 2012-03-10 NOTE — Progress Notes (Signed)
  Subjective:    Patient ID: Ross [name], male    DOB: 27-Aug-1955, 56 y.o.   MRN: [id number]  HPI Ross [name] is a 56 y.o. male Hangnail on R 3rd finger - ripped out hangnail 1 month ago, redness and puffy, with drainage in few days. Tried vinegar, neosporin to area - few times. Does not feel like getting better overall - started to improve 2 weeks ago, then increased swelling and discharge, now stable again.    HTN - home BP's 130-16/80.  Primary doctor - Dr. Tawanna Cooler.  Has gained weight since last ov in March. Due for OV.     Review of Systems  Constitutional: Negative for fever, fatigue and unexpected weight change.  Eyes: Negative for visual disturbance.  Respiratory: Negative for cough, chest tightness and shortness of breath.   Cardiovascular: Negative for chest pain, palpitations and leg swelling.  Gastrointestinal: Negative for abdominal pain and blood in stool.  Skin: Positive for wound.  Neurological: Negative for dizziness, light-headedness and headaches.       Objective:   Physical Exam  Constitutional: He is oriented to person, place, and time. He appears well-developed and well-nourished.  HENT:  Head: Normocephalic and atraumatic.  Eyes: EOM are normal. Pupils are equal, [name], and reactive to light.  Neck: No JVD present. Carotid bruit is not present.  Cardiovascular: Normal rate, regular rhythm and normal heart sounds.   No murmur heard. Pulmonary/Chest: Effort normal and breath sounds normal. He has no rales.  Musculoskeletal: He exhibits no edema.       Right hand: He exhibits swelling. He exhibits normal range of motion.       Hands: Neurological: He is alert and oriented to person, place, and time.  Skin: Skin is warm and dry.  Psychiatric: He has a normal mood and affect. His behavior is normal.        Assessment & Plan:   Ross [name] is a 56 y.o. male   HTN - borderline - record of blood pressures outside of the office and bring them to the  next office visit with his primary provider in next 6-8 weeks.   If remaining elevated - can call primary provider to determine if visit necessary sooner.   R finger paronychia - now acute on chronic/recurrent with initial "hangnail" removal.  See procedure note.  Will cover for infection with doxycycline, and keep area clean/ dry.  rtc precautions.

## 2012-03-10 NOTE — Progress Notes (Signed)
   Patient ID: FILOMENO FRILOT MRN: [id number], DOB: 13-Jun-1955, 56 y.o. Date of Encounter: 03/10/2012, 3:51 PM   PROCEDURE NOTE: Verbal consent obtained.  Numbing: Anesthesia obtained with 1:1 ratio 2% plain lidocaine with 0.5% Marcaine  Betadine prep per usual protocol.  Medial aspect of right third nail lifted without difficulty and removed in total. Proximal aspect of nail bed explored revealing no nail remnants. Ingrown tissue debrided and irrigated. Xeroform dressing applied. Wound care instructions including precautions covered with patient. Handout given.   SignedEula Listen, PA-C 03/10/2012 3:51 PM

## 2012-03-10 NOTE — Patient Instructions (Signed)
Keep a record of your blood pressures outside of the office and bring them to the next office visit with your primary doctor. Call their office if pressures elevated.  Start antibiotic for finger infection.  Try to keep this area clean and dry.  Return to the clinic or go to the nearest emergency room if any of your symptoms worsen or new symptoms occur.

## 2012-04-22 ENCOUNTER — Other Ambulatory Visit: Payer: Self-pay | Admitting: Family Medicine

## 2012-05-19 ENCOUNTER — Other Ambulatory Visit: Payer: Self-pay | Admitting: Family Medicine

## 2012-05-21 ENCOUNTER — Ambulatory Visit (INDEPENDENT_AMBULATORY_CARE_PROVIDER_SITE_OTHER): Payer: BC Managed Care – PPO | Admitting: Family Medicine

## 2012-05-21 ENCOUNTER — Encounter: Payer: Self-pay | Admitting: Family Medicine

## 2012-05-21 VITALS — BP 120/80 | Temp 99.0°F | Wt 281.0 lb

## 2012-05-21 DIAGNOSIS — M109 Gout, unspecified: Secondary | ICD-10-CM

## 2012-05-21 DIAGNOSIS — I10 Essential (primary) hypertension: Secondary | ICD-10-CM

## 2012-05-21 DIAGNOSIS — E669 Obesity, unspecified: Secondary | ICD-10-CM

## 2012-05-21 DIAGNOSIS — K219 Gastro-esophageal reflux disease without esophagitis: Secondary | ICD-10-CM

## 2012-05-21 MED ORDER — LOSARTAN POTASSIUM-HCTZ 100-12.5 MG PO TABS: 1.0000 | ORAL_TABLET | Freq: Every day | ORAL | Status: DC

## 2012-05-21 MED ORDER — OMEPRAZOLE 40 MG PO CPDR: 40.0000 mg | DELAYED_RELEASE_CAPSULE | Freq: Two times a day (BID) | ORAL | Status: DC

## 2012-05-21 MED ORDER — ALLOPURINOL 300 MG PO TABS: ORAL_TABLET | ORAL | Status: DC

## 2012-05-21 NOTE — Patient Instructions (Signed)
Continue your current medications  Enroll l in [employer]  Followup physical examination in 3 months  Labs one week prior

## 2012-05-21 NOTE — Progress Notes (Signed)
  Subjective:    Patient ID: Ross [name], male    DOB: 03-Jul-1955, 57 y.o.   MRN: [id number]  HPI Kailen is a 57 year old married male nonsmoker who comes in today to discuss his medications and to get set up for a physical examination since he is due  He currently takes allopurinol 300 mg daily to prevent gout, aspirin and Hyzaar 100-12.5 daily for hypertension BP 120/80 and omeprazole 40 mg daily because of chronic reflux esophagitis. He states she's been compliant with his medications but he needs refills. Last physical examination January 2013. His biggest concern today is his weight. He's 281 pounds he says are loose 20 in and get it all back. They've discussed options at home he and his wife are both willing to go to [employer]. I also offered the weight loss program at the hospital however he would like to try this program first.  He states he otherwise feels well   Review of Systems General review of systems otherwise negative    Objective:   Physical Exam  Well-developed and nourished and in no acute distress BP 120/80 right arm sitting position weight 281 pounds      Assessment & Plan:  Hypertension at goal continue current therapy  History of gout continue allopurinol 300 mg daily  Reflux esophagitis continue Prilosec 40 mg daily  Obesity weight watchers program followup CPX in 3 months

## 2012-09-07 ENCOUNTER — Encounter (INDEPENDENT_AMBULATORY_CARE_PROVIDER_SITE_OTHER): Payer: BC Managed Care – PPO | Admitting: Ophthalmology

## 2012-09-07 DIAGNOSIS — H34239 Retinal artery branch occlusion, unspecified eye: Secondary | ICD-10-CM

## 2012-09-07 DIAGNOSIS — H4010X0 Unspecified open-angle glaucoma, stage unspecified: Secondary | ICD-10-CM

## 2012-09-07 DIAGNOSIS — H348192 Central retinal vein occlusion, unspecified eye, stable: Secondary | ICD-10-CM

## 2012-09-07 DIAGNOSIS — I10 Essential (primary) hypertension: Secondary | ICD-10-CM

## 2012-09-07 DIAGNOSIS — H35039 Hypertensive retinopathy, unspecified eye: Secondary | ICD-10-CM

## 2012-09-07 DIAGNOSIS — H43819 Vitreous degeneration, unspecified eye: Secondary | ICD-10-CM

## 2012-09-07 DIAGNOSIS — H2510 Age-related nuclear cataract, unspecified eye: Secondary | ICD-10-CM

## 2012-10-23 ENCOUNTER — Ambulatory Visit (INDEPENDENT_AMBULATORY_CARE_PROVIDER_SITE_OTHER): Payer: Self-pay | Admitting: Ophthalmology

## 2012-11-05 ENCOUNTER — Ambulatory Visit (INDEPENDENT_AMBULATORY_CARE_PROVIDER_SITE_OTHER): Payer: BC Managed Care – PPO | Admitting: Ophthalmology

## 2012-11-05 DIAGNOSIS — H35039 Hypertensive retinopathy, unspecified eye: Secondary | ICD-10-CM

## 2012-11-05 DIAGNOSIS — H43819 Vitreous degeneration, unspecified eye: Secondary | ICD-10-CM

## 2012-11-05 DIAGNOSIS — H348392 Tributary (branch) retinal vein occlusion, unspecified eye, stable: Secondary | ICD-10-CM

## 2012-11-05 DIAGNOSIS — H348192 Central retinal vein occlusion, unspecified eye, stable: Secondary | ICD-10-CM

## 2012-11-05 DIAGNOSIS — I10 Essential (primary) hypertension: Secondary | ICD-10-CM

## 2012-11-06 ENCOUNTER — Ambulatory Visit (INDEPENDENT_AMBULATORY_CARE_PROVIDER_SITE_OTHER): Payer: Self-pay | Admitting: Ophthalmology

## 2013-02-01 ENCOUNTER — Other Ambulatory Visit: Payer: Self-pay | Admitting: Family Medicine

## 2013-03-11 ENCOUNTER — Ambulatory Visit (INDEPENDENT_AMBULATORY_CARE_PROVIDER_SITE_OTHER): Payer: BC Managed Care – PPO | Admitting: Ophthalmology

## 2013-03-11 DIAGNOSIS — H348392 Tributary (branch) retinal vein occlusion, unspecified eye, stable: Secondary | ICD-10-CM

## 2013-03-11 DIAGNOSIS — H43819 Vitreous degeneration, unspecified eye: Secondary | ICD-10-CM

## 2013-03-11 DIAGNOSIS — I10 Essential (primary) hypertension: Secondary | ICD-10-CM

## 2013-03-11 DIAGNOSIS — H2510 Age-related nuclear cataract, unspecified eye: Secondary | ICD-10-CM

## 2013-03-11 DIAGNOSIS — H35039 Hypertensive retinopathy, unspecified eye: Secondary | ICD-10-CM

## 2013-03-11 DIAGNOSIS — H348192 Central retinal vein occlusion, unspecified eye, stable: Secondary | ICD-10-CM

## 2013-04-29 ENCOUNTER — Other Ambulatory Visit: Payer: Self-pay

## 2013-04-30 ENCOUNTER — Other Ambulatory Visit (INDEPENDENT_AMBULATORY_CARE_PROVIDER_SITE_OTHER): Payer: BC Managed Care – PPO

## 2013-04-30 DIAGNOSIS — Z0000 Encounter for general adult medical examination without abnormal findings: Secondary | ICD-10-CM

## 2013-04-30 LAB — CBC WITH DIFFERENTIAL/PLATELET
Basophils Relative: 0.6 % (ref 0.0–3.0)
Eosinophils Relative: 4.3 % (ref 0.0–5.0)
Lymphocytes Relative: 35.7 % (ref 12.0–46.0)
Neutrophils Relative %: 49.9 % (ref 43.0–77.0)
RBC: 4.54 Mil/uL (ref 4.22–5.81)
WBC: 6.3 10*3/uL (ref 4.5–10.5)

## 2013-04-30 LAB — HEPATIC FUNCTION PANEL
ALT: 32 U/L (ref 0–53)
Alkaline Phosphatase: 53 U/L (ref 39–117)
Bilirubin, Direct: 0.1 mg/dL (ref 0.0–0.3)
Total Bilirubin: 0.9 mg/dL (ref 0.3–1.2)

## 2013-04-30 LAB — POCT URINALYSIS DIPSTICK
Bilirubin, UA: NEGATIVE
Blood, UA: NEGATIVE
Spec Grav, UA: 1.025
Urobilinogen, UA: 0.2
pH, UA: 5.5

## 2013-04-30 LAB — LIPID PANEL
HDL: 42.1 mg/dL (ref >39.00)
LDL Cholesterol: 102 mg/dL — ABNORMAL HIGH (ref 0–99)
Total CHOL/HDL Ratio: 4
Triglycerides: 106 mg/dL (ref 0.0–149.0)

## 2013-04-30 LAB — BASIC METABOLIC PANEL
Calcium: 9.1 mg/dL (ref 8.4–10.5)
Creatinine, Ser: 0.9 mg/dL (ref 0.4–1.5)
GFR: 92.93 mL/min (ref >60.00)

## 2013-05-14 ENCOUNTER — Encounter: Payer: Self-pay | Admitting: Family Medicine

## 2013-05-14 ENCOUNTER — Ambulatory Visit (INDEPENDENT_AMBULATORY_CARE_PROVIDER_SITE_OTHER): Payer: BC Managed Care – PPO | Admitting: Family Medicine

## 2013-05-14 VITALS — BP 120/80 | Temp 98.3°F | Ht 73.25 in | Wt 298.0 lb

## 2013-05-14 DIAGNOSIS — N529 Male erectile dysfunction, unspecified: Secondary | ICD-10-CM

## 2013-05-14 DIAGNOSIS — K219 Gastro-esophageal reflux disease without esophagitis: Secondary | ICD-10-CM

## 2013-05-14 DIAGNOSIS — M109 Gout, unspecified: Secondary | ICD-10-CM

## 2013-05-14 DIAGNOSIS — I10 Essential (primary) hypertension: Secondary | ICD-10-CM

## 2013-05-14 DIAGNOSIS — G4733 Obstructive sleep apnea (adult) (pediatric): Secondary | ICD-10-CM

## 2013-05-14 MED ORDER — LOSARTAN POTASSIUM-HCTZ 100-12.5 MG PO TABS: 1.0000 | ORAL_TABLET | Freq: Every day | ORAL | Status: DC

## 2013-05-14 MED ORDER — OMEPRAZOLE 40 MG PO CPDR: DELAYED_RELEASE_CAPSULE | ORAL | Status: DC

## 2013-05-14 MED ORDER — ALLOPURINOL 300 MG PO TABS: ORAL_TABLET | ORAL | Status: DC

## 2013-05-14 MED ORDER — SILDENAFIL CITRATE 100 MG PO TABS: 50.0000 mg | ORAL_TABLET | Freq: Every day | ORAL | Status: DC | PRN

## 2013-05-14 NOTE — Progress Notes (Signed)
Pre visit review using our clinic review tool, if applicable. No additional management support is needed unless otherwise documented below in the visit note. 

## 2013-05-14 NOTE — Progress Notes (Signed)
   Subjective:    Patient ID: Ross [name], male    DOB: 05-Jan-1956, 57 y.o.   MRN: [id number]  HPI Ross [name] is a 58 year old married male nonsmoker who comes in today for general physical examination because of a history of gout, hypertension, reflux esophagitis, glaucoma and a spontaneous retinal hemorrhage 3 years ago.  His medications are reviewed and are the same. BP 120/80. The retinal hemorrhage was precipitated by hypertension. He saw Dr. Ashley Royalty his ophthalmologist recently who said his eye looked fairly normal. Visual acuity is basically normal to.  He gets regular eye check every 6 months, regular dental care, colonoscopy a couple years ago normal, vaccinations up-to-date seasonal flu shot given today.   Review of Systems  Constitutional: Negative.   HENT: Negative.   Eyes: Negative.   Respiratory: Negative.   Cardiovascular: Negative.   Gastrointestinal: Negative.   Endocrine: Negative.   Genitourinary: Negative.   Musculoskeletal: Negative.   Skin: Negative.   Allergic/Immunologic: Negative.   Neurological: Negative.   Hematological: Negative.   Psychiatric/Behavioral: Negative.        Objective:   Physical Exam  Nursing note and vitals reviewed. Constitutional: He is oriented to person, place, and time. He appears well-developed and well-nourished.  HENT:  Head: Normocephalic and atraumatic.  Right Ear: External ear normal.  Left Ear: External ear normal.  Nose: Nose normal.  Mouth/Throat: Oropharynx is clear and moist.  Eyes: Conjunctivae and EOM are normal. Pupils are equal, [name], and reactive to light.  Neck: Normal range of motion. Neck supple. No JVD present. No tracheal deviation present. No thyromegaly present.  Cardiovascular: Normal rate, regular rhythm, normal heart sounds and intact distal pulses.  Exam reveals no gallop and no friction rub.   No murmur heard. No carotid or aortic bruits peripheral pulses 2+ and symmetrical  Pulmonary/Chest:  Effort normal and breath sounds normal. No stridor. No respiratory distress. He has no wheezes. He has no rales. He exhibits no tenderness.  Abdominal: Soft. Bowel sounds are normal. He exhibits no distension and no mass. There is no tenderness. There is no rebound and no guarding.  Genitourinary: Rectum normal, prostate normal and penis normal. Guaiac negative stool. No penile tenderness.  Musculoskeletal: Normal range of motion. He exhibits no edema and no tenderness.  Lymphadenopathy:    He has no cervical adenopathy.  Neurological: He is alert and oriented to person, place, and time. He has normal reflexes. No cranial nerve deficit. He exhibits normal muscle tone.  Skin: Skin is warm and dry. No rash noted. No erythema. No pallor.  Total body skin exam normal except for a skin tag on his left upper eyelid and a scar midline border from previous lumbar disc surgery  Psychiatric: He has a normal mood and affect. His behavior is normal. Judgment and thought content normal.          Assessment & Plan:  History of gout continue allopurinol  Hypertension ago continue Hyzaar and aspirin  Reflux esophagitis continue omeprazole 40 mg daily  Status post hemorrhage left eye 3 years ago from hypertension followup every 6 months by Dr. Ashley Royalty blood pressure now normal  Lesion left upper eyelid return for removal since is interfering with his vision  Overweight again discussed diet exercise and weight loss

## 2013-05-14 NOTE — Patient Instructions (Signed)
Continue current medications  Viagra 100 mg,,,,,,,,,,,,,, [email or web address]  Begin a diet and exercise program to help with you weight and overall health  Return in one year sooner if any problems,

## 2013-05-21 ENCOUNTER — Ambulatory Visit (INDEPENDENT_AMBULATORY_CARE_PROVIDER_SITE_OTHER): Payer: BC Managed Care – PPO | Admitting: Family Medicine

## 2013-05-21 ENCOUNTER — Encounter: Payer: Self-pay | Admitting: Family Medicine

## 2013-05-21 DIAGNOSIS — L909 Atrophic disorder of skin, unspecified: Secondary | ICD-10-CM

## 2013-05-21 DIAGNOSIS — L918 Other hypertrophic disorders of the skin: Secondary | ICD-10-CM | POA: Insufficient documentation

## 2013-05-21 DIAGNOSIS — L919 Hypertrophic disorder of the skin, unspecified: Secondary | ICD-10-CM

## 2013-05-21 NOTE — Progress Notes (Signed)
   Subjective:    Patient ID: Ross [name], male    DOB: July 06, 1955, 58 y.o.   MRN: [id number]  HPI Ross [name] is a 58 year old male nonsmoker who comes in today for removal of a lesion on his left upper eyelid  He has a skin tag on his left upper eyelid that is constantly ear dictated when he closes his eyes.  After informed consent the lesion was anesthetized with 1% Xylocaine with epinephrine and removed without complications. The bases cauterized     Review of Systems    negative Objective:   Physical Exam   Procedure see above     Assessment & Plan:  Skin tag left upper eyelid removed not for cosmetic,,,,,,,,,,,for chronic irritation

## 2013-05-21 NOTE — Patient Instructions (Signed)
Return when necessary 

## 2013-05-28 ENCOUNTER — Other Ambulatory Visit: Payer: Self-pay | Admitting: Family Medicine

## 2013-08-16 ENCOUNTER — Ambulatory Visit (INDEPENDENT_AMBULATORY_CARE_PROVIDER_SITE_OTHER): Payer: BC Managed Care – PPO | Admitting: Family Medicine

## 2013-08-16 VITALS — BP 138/86 | HR 84 | Temp 98.1°F | Resp 16 | Ht 73.5 in | Wt 289.0 lb

## 2013-08-16 DIAGNOSIS — M109 Gout, unspecified: Secondary | ICD-10-CM

## 2013-08-16 MED ORDER — ALLOPURINOL 300 MG PO TABS: ORAL_TABLET | ORAL | Status: DC

## 2013-08-16 MED ORDER — PREDNISONE 20 MG PO TABS: ORAL_TABLET | ORAL | Status: DC

## 2013-08-16 NOTE — Patient Instructions (Signed)

## 2013-08-16 NOTE — Progress Notes (Signed)
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  For the last 3 days he's developed pain in his left MTP joint. He has a history of gout in the past as taken allopurinol intermittently. Nevertheless he hasn't taken it recently until this started hurting and he restarted the allopurinol  Objective: Mild erythema and tenderness in the left MTP joint with decreased range of motion.  Assessment:Gout, unspecified - Plan: predniSONE (DELTASONE) 20 MG tablet, allopurinol (ZYLOPRIM) 300 MG tablet Patient instructed to get back on his allopurinol after 2 days of prednisone and stay on to prevent further gouty attacks Signed, Elvina SidleKurt Lenora Gomes, MD

## 2013-09-02 ENCOUNTER — Encounter: Payer: Self-pay | Admitting: Family Medicine

## 2013-09-02 ENCOUNTER — Ambulatory Visit (INDEPENDENT_AMBULATORY_CARE_PROVIDER_SITE_OTHER): Payer: BC Managed Care – PPO | Admitting: Family Medicine

## 2013-09-02 VITALS — BP 120/80 | Temp 98.8°F | Wt 288.0 lb

## 2013-09-02 DIAGNOSIS — M5431 Sciatica, right side: Secondary | ICD-10-CM | POA: Insufficient documentation

## 2013-09-02 DIAGNOSIS — M5430 Sciatica, unspecified side: Secondary | ICD-10-CM

## 2013-09-02 LAB — BASIC METABOLIC PANEL
BUN: 17 mg/dL (ref 6–23)
CALCIUM: 9.4 mg/dL (ref 8.4–10.5)
CHLORIDE: 103 meq/L (ref 96–112)
CO2: 27 mEq/L (ref 19–32)
Creatinine, Ser: 1.1 mg/dL (ref 0.4–1.5)
GFR: 71.94 mL/min (ref >60.00)
Glucose, Bld: 106 mg/dL — ABNORMAL HIGH (ref 70–99)
Potassium: 4 mEq/L (ref 3.5–5.1)
Sodium: 140 mEq/L (ref 135–145)

## 2013-09-02 MED ORDER — CYCLOBENZAPRINE HCL 10 MG PO TABS: 10.0000 mg | ORAL_TABLET | Freq: Three times a day (TID) | ORAL | Status: DC | PRN

## 2013-09-02 MED ORDER — TRAMADOL HCL 50 MG PO TABS: 50.0000 mg | ORAL_TABLET | Freq: Three times a day (TID) | ORAL | Status: DC | PRN

## 2013-09-02 NOTE — Progress Notes (Signed)
Pre visit review using our clinic review tool, if applicable. No additional management support is needed unless otherwise documented below in the visit note. 

## 2013-09-02 NOTE — Progress Notes (Signed)
   Subjective:    Patient ID: Ross [name], male    DOB: [birth year], 58 y.o.   MRN: [id number]  HPI  Ross [name] is a 58 year old male who comes in today for evaluation of back pain  History clear had right L5 surgery 20 years ago by Dr. Venetia MaxonStern. Since that time he said well except the last couple weeks his pain his return. He was told at that time he had bulging disc in his neck and his lumbar spine. 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 Bending lifting twisting. When this started he started having back pain again. He describes his pain as sometimes sharp sometimes dull it's constant and very sure 05/17/2002. It radiates down his right hip. The pain is increased by working too much she is about 8 hours into his shift he starts having discomfort. He works 4 days on and 4 off. The 4 days off he typically lies on the floor ice his back and takes about 600 mg of Advil 3 times daily. He has no bowel or bladder dysfunction.  Review of Systems    review of systems otherwise negative no neurologic symptoms Objective:   Physical Exam  Well-developed well-nourished male in no acute distress vital signs stable he is afebrile.  In the supine position his legs were of equal length. Sensation muscle strength reflexes pulses are within normal limits. Straight leg raising negative      Assessment & Plan:  Lumbar disease without evidence of neurologic deficit........ conservative therapy with exercise physical therapy medication. If symptoms persist or get worse then we'll consider neurosurgical reconsult with Dr. Venetia MaxonStern

## 2013-09-02 NOTE — Patient Instructions (Signed)
Motrin 400 mg twice daily with food  Flexeril and tramadol.............. one half to one of each at bedtime.............. if it's a weekend and your having a lot of pain you can take a half of each 3 times daily and go to bed rest for a day or 2  We'll get you set up for physical therapy ASAP

## 2013-09-05 ENCOUNTER — Ambulatory Visit: Payer: BC Managed Care – PPO | Attending: Family Medicine

## 2013-09-05 DIAGNOSIS — R5381 Other malaise: Secondary | ICD-10-CM | POA: Insufficient documentation

## 2013-09-05 DIAGNOSIS — IMO0001 Reserved for inherently not codable concepts without codable children: Secondary | ICD-10-CM | POA: Insufficient documentation

## 2013-09-05 DIAGNOSIS — M25559 Pain in unspecified hip: Secondary | ICD-10-CM | POA: Insufficient documentation

## 2013-09-05 DIAGNOSIS — M545 Low back pain, unspecified: Secondary | ICD-10-CM | POA: Insufficient documentation

## 2013-09-10 ENCOUNTER — Ambulatory Visit: Payer: BC Managed Care – PPO | Admitting: Physical Therapy

## 2013-09-12 ENCOUNTER — Ambulatory Visit: Payer: BC Managed Care – PPO

## 2013-09-18 ENCOUNTER — Ambulatory Visit: Payer: BC Managed Care – PPO | Attending: Family Medicine | Admitting: Physical Therapy

## 2013-09-18 DIAGNOSIS — IMO0001 Reserved for inherently not codable concepts without codable children: Secondary | ICD-10-CM | POA: Insufficient documentation

## 2013-09-18 DIAGNOSIS — R5381 Other malaise: Secondary | ICD-10-CM | POA: Insufficient documentation

## 2013-09-18 DIAGNOSIS — M25559 Pain in unspecified hip: Secondary | ICD-10-CM | POA: Insufficient documentation

## 2013-09-18 DIAGNOSIS — M545 Low back pain, unspecified: Secondary | ICD-10-CM | POA: Insufficient documentation

## 2013-09-19 ENCOUNTER — Ambulatory Visit: Payer: BC Managed Care – PPO | Admitting: Physical Therapy

## 2013-09-26 ENCOUNTER — Ambulatory Visit: Payer: BC Managed Care – PPO | Admitting: Physical Therapy

## 2013-09-27 ENCOUNTER — Ambulatory Visit: Payer: BC Managed Care – PPO | Admitting: Physical Therapy

## 2013-09-30 ENCOUNTER — Ambulatory Visit: Payer: BC Managed Care – PPO

## 2013-10-01 ENCOUNTER — Ambulatory Visit (INDEPENDENT_AMBULATORY_CARE_PROVIDER_SITE_OTHER): Payer: BC Managed Care – PPO | Admitting: Emergency Medicine

## 2013-10-01 VITALS — BP 136/96 | HR 89 | Temp 98.0°F | Resp 16 | Ht 72.0 in | Wt 287.6 lb

## 2013-10-01 DIAGNOSIS — A088 Other specified intestinal infections: Secondary | ICD-10-CM

## 2013-10-01 MED ORDER — ONDANSETRON 8 MG PO TBDP: 8.0000 mg | ORAL_TABLET | Freq: Three times a day (TID) | ORAL | Status: DC | PRN

## 2013-10-01 MED ORDER — LOPERAMIDE HCL 2 MG PO TABS: ORAL_TABLET | ORAL | Status: DC

## 2013-10-01 NOTE — Patient Instructions (Signed)
Diet The clear liquid diet consists of foods that are liquid or will become liquid at room temperature. Examples of foods allowed on a clear liquid diet include fruit juice, broth or bouillon, gelatin, or frozen ice pops. You should be able to see through the liquid. The purpose of this diet is to provide the necessary fluids, electrolytes (such as sodium and potassium), and energy to keep the body functioning during times when you are not able to consume a regular diet. A clear liquid diet should not be continued for long periods of time, as it is not nutritionally adequate.  A CLEAR LIQUID DIET MAY BE NEEDED:  When a sudden-onset (acute) condition occurs before or after surgery.   As the first step in oral feeding.   For fluid and electrolyte replacement in diarrheal diseases.   As a diet before certain medical tests are performed.  ADEQUACY The clear liquid diet is adequate only in ascorbic acid, according to the Recommended Dietary Allowances of the National Research Council.  CHOOSING FOODS Breads and Starches  Allowed: None are allowed.   Avoid: All are to be avoided.  Vegetables  Allowed: Strained vegetable juices.   Avoid: Any others.  Fruit  Allowed: Strained fruit juices and fruit drinks. Include 1 serving of citrus or vitamin C-enriched fruit juice daily.   Avoid: Any others.  Meat and Meat Substitutes  Allowed: None are allowed.   Avoid: All are to be avoided.  Milk Products  Allowed: None are allowed.   Avoid: All are to be avoided.  Soups and Combination Foods  Allowed: Clear bouillon, broth, or strained broth-based soups.   Avoid: Any others.  Desserts and Sweets  Allowed: Sugar, honey. High-protein gelatin. Flavored gelatin, ices, or frozen ice pops that do not contain milk.   Avoid: Any others.  Fats and Oils  Allowed: None are allowed.   Avoid: All are to be avoided.  Beverages  Allowed: Cereal  beverages, coffee (regular or decaffeinated), tea, or soda at the discretion of your health care provider.   Avoid: Any others.  Condiments  Allowed: Salt.   Avoid: Any others, including pepper.  Supplements  Allowed: Liquid nutrition beverages that you can see through.   Avoid: Any others that contain lactose or fiber. SAMPLE MEAL PLAN Breakfast  4 oz (120 mL) strained orange juice.   to 1 cup (120 to 240 mL) gelatin (plain or fortified).  1 cup (240 mL) beverage (coffee or tea).  Sugar, if desired. Midmorning Snack   cup (120 mL) gelatin (plain or fortified). Lunch  1 cup (240 mL) broth or consomm.  4 oz (120 mL) strained grapefruit juice.   cup (120 mL) gelatin (plain or fortified).  1 cup (240 mL) beverage (coffee or tea).  Sugar, if desired. Midafternoon Snack   cup (120 mL) fruit ice.   cup (120 mL) strained fruit juice. Dinner  1 cup (240 mL) broth or consomm.   cup (120 mL) cranberry juice.   cup (120 mL) flavored gelatin (plain or fortified).  1 cup (240 mL) beverage (coffee or tea).  Sugar, if desired. Evening Snack  4 oz (120 mL) strained apple juice (vitamin C-fortified).   cup (120 mL) flavored gelatin (plain or fortified). MAKE SURE YOU:  Understand these instructions.  Will watch your child's condition.  Will get help right away if your child is not doing well or gets worse. Document Released: 05/02/2005 Document Revised: 01/02/2013 Document Reviewed: 10/02/2012 ExitCare Patient Information 2014 [employer]. Viral   Gastroenteritis Viral gastroenteritis is also known as stomach flu. This condition affects the stomach and intestinal tract. It can cause sudden diarrhea and vomiting. The illness typically lasts 3 to 8 days. Most people develop an immune response that eventually gets rid of the virus. While this natural response develops, the virus can make you quite ill. CAUSES  Many different viruses can cause  gastroenteritis, such as rotavirus or noroviruses. You can catch one of these viruses by consuming contaminated food or water. You may also catch a virus by sharing utensils or other personal items with an infected person or by touching a contaminated surface. SYMPTOMS  The most common symptoms are diarrhea and vomiting. These problems can cause a severe loss of body fluids (dehydration) and a body salt (electrolyte) imbalance. Other symptoms may include:  Fever.  Headache.  Fatigue.  Abdominal pain. DIAGNOSIS  Your caregiver can usually diagnose viral gastroenteritis based on your symptoms and a physical exam. A stool sample may also be taken to test for the presence of viruses or other infections. TREATMENT  This illness typically goes away on its own. Treatments are aimed at rehydration. The most serious cases of viral gastroenteritis involve vomiting so severely that you are not able to keep fluids down. In these cases, fluids must be given through an intravenous line (IV). HOME CARE INSTRUCTIONS   Drink enough fluids to keep your urine clear or pale yellow. Drink small amounts of fluids frequently and increase the amounts as tolerated.  Ask your caregiver for specific rehydration instructions.  Avoid:  Foods high in sugar.  Alcohol.  Carbonated drinks.  Tobacco.  Juice.  Caffeine drinks.  Extremely hot or cold fluids.  Fatty, greasy foods.  Too much intake of anything at one time.  Dairy products until 24 to 48 hours after diarrhea stops.  You may consume probiotics. Probiotics are active cultures of beneficial bacteria. They may lessen the amount and number of diarrheal stools in adults. Probiotics can be found in yogurt with active cultures and in supplements.  Wash your hands well to avoid spreading the virus.  Only take over-the-counter or prescription medicines for pain, discomfort, or fever as directed by your caregiver. Do not give aspirin to children.  Antidiarrheal medicines are not recommended.  Ask your caregiver if you should continue to take your regular prescribed and over-the-counter medicines.  Keep all follow-up appointments as directed by your caregiver. SEEK IMMEDIATE MEDICAL CARE IF:   You are unable to keep fluids down.  You do not urinate at least once every 6 to 8 hours.  You develop shortness of breath.  You notice blood in your stool or vomit. This may look like coffee grounds.  You have abdominal pain that increases or is concentrated in one small area (localized).  You have persistent vomiting or diarrhea.  You have a fever.  The patient is a child younger than 3 months, and he or she has a fever.  The patient is a child older than 3 months, and he or she has a fever and persistent symptoms.  The patient is a child older than 3 months, and he or she has a fever and symptoms suddenly get worse.  The patient is a baby, and he or she has no tears when crying. MAKE SURE YOU:   Understand these instructions.  Will watch your condition.  Will get help right away if you are not doing well or get worse. Document Released: 05/02/2005 Document Revised: 07/25/2011 Document Reviewed: 02/16/2011   ExitCare Patient Information 2014 [employer].  

## 2013-10-01 NOTE — Progress Notes (Signed)
[facility] [address], [place] [address] [phone]  Date:  10/01/2013   Name:  Ross [name]   DOB:  1955-11-12   MRN:  [id number]  PCP:  Evette GeorgesDD,JEFFREY ALLEN, MD    Chief Complaint: Diarrhea   History of Present Illness:  Ross [name] is a 58 y.o. very pleasant male patient who presents with the following:  Ill since yesterday with diarrhea and nausea.  Says no vomiting.  No fever or chills.  The patient has no complaint of blood, mucous, or pus in her stools.  No ill contacts.  Had colonoscopy 3 years ago.  Taking liquids but belching.  Some left abdominal pain.  No improvement with over the counter medications or other home remedies.  Denies other complaint or health concern today.   Patient Active Problem List   Diagnosis Date Noted  . Back pain with right-sided sciatica 09/02/2013  . Skin tag 05/21/2013  . GERD (gastroesophageal reflux disease) 02/18/2011  . HYPERTENSION, ESSENTIAL NOS 11/27/2009  . OBSTRUCTIVE SLEEP APNEA 10/29/2009  . OBESITY, UNSPECIFIED 04/02/2009  . Gout, Unspecified 01/25/2007  . ERECTILE DYSFUNCTION, ORGANIC 01/25/2007    Past Medical History  Diagnosis Date  . Family history of malignant neoplasm of gastrointestinal tract     mother  . Sleep apnea   . Gout   . Hypertension   . DDD (degenerative disc disease)   . GERD (gastroesophageal reflux disease)   . Rectal bleeding   . Hemorrhoids   . Fatty liver     Past Surgical History  Procedure Laterality Date  . Tonsillectomy    . Back surgery    . Abdominal surgery    . Tonsillectomy and adenoidectomy    . Pyloric stenosis      as infant  . Lumbar spine surgery    . Eye surgery    . Glaucoma surgery      History  Substance Use Topics  . Smoking status: Former Smoker -- 1.00 packs/day for 20 years    Types: Cigarettes    Quit date: 05/17/1991  . Smokeless tobacco: Never Used  . Alcohol Use: 1.0 oz/week    2 drink(s) per week     Comment: Social  use     Family History  Problem Relation Age of Onset  . Colon cancer Mother   . Stomach cancer Paternal Grandmother   . Prostate cancer Father   . Diabetes Maternal Grandmother   . Heart disease Maternal Grandmother   . Breast cancer Mother   . Testicular cancer Father   . Heart disease Maternal Grandfather     No Known Allergies  Medication list has been reviewed and updated.  Current Outpatient Prescriptions on File Prior to Visit  Medication Sig Dispense Refill  . allopurinol (ZYLOPRIM) 300 MG tablet TAKE 1 TABLET BY MOUTH EVERY DAY  100 tablet  3  . aspirin 81 MG tablet Take 81 mg by mouth daily.        Marland Kitchen. losartan-hydrochlorothiazide (HYZAAR) 100-12.5 MG per tablet Take 1 tablet by mouth daily.  100 tablet  3  . Multiple Vitamin (MULTIVITAMIN) tablet Take 1 tablet by mouth. Once or twice a week      . omeprazole (PRILOSEC) 40 MG capsule 1 by mouth every morning  100 capsule  3  . sildenafil (VIAGRA) 100 MG tablet Take 0.5-1 tablets (50-100 mg total) by mouth daily as needed for erectile dysfunction.  10 tablet  11   No  current facility-administered medications on file prior to visit.    Review of Systems:  As per HPI, otherwise negative.    Physical Examination: Filed Vitals:   10/01/13 1139  BP: 136/96  Pulse: 89  Temp: 98 F (36.7 C)  Resp: 16   Filed Vitals:   10/01/13 1139  Height: 6' (1.829 m)  Weight: 287 lb 9.6 oz (130.455 kg)   Body mass index is 39 kg/(m^2). Ideal Body Weight: Weight in (lb) to have BMI = 25: 183.9  GEN: obese, NAD, Non-toxic, A & O x 3 HEENT: Atraumatic, Normocephalic. Neck supple. No masses, No LAD. Ears and Nose: No external deformity. CV: RRR, No M/G/R. No JVD. No thrill. No extra heart sounds. PULM: CTA B, no wheezes, crackles, rhonchi. No retractions. No resp. distress. No accessory muscle use. ABD: S, NT, ND, +BS. No rebound. No HSM.  Hyperactive BS EXTR: No c/c/e NEURO Normal gait.  PSYCH: Normally interactive.  Conversant. Not depressed or anxious appearing.  Calm demeanor.    Assessment and Plan: Gastroenteritis Imodium zofran Clears Follow up as needed   Signed,  Phillips OdorJeffery Anderson, MD

## 2013-10-04 ENCOUNTER — Encounter: Payer: Self-pay | Admitting: Physical Therapy

## 2013-11-05 ENCOUNTER — Encounter: Payer: Self-pay | Admitting: Family Medicine

## 2013-11-05 ENCOUNTER — Ambulatory Visit (INDEPENDENT_AMBULATORY_CARE_PROVIDER_SITE_OTHER): Payer: BC Managed Care – PPO | Admitting: Family Medicine

## 2013-11-05 VITALS — BP 110/80 | Temp 98.7°F | Wt 281.0 lb

## 2013-11-05 DIAGNOSIS — R1032 Left lower quadrant pain: Secondary | ICD-10-CM

## 2013-11-05 DIAGNOSIS — K625 Hemorrhage of anus and rectum: Secondary | ICD-10-CM | POA: Insufficient documentation

## 2013-11-05 LAB — CBC WITH DIFFERENTIAL/PLATELET
BASOS PCT: 0.6 % (ref 0.0–3.0)
Basophils Absolute: 0 10*3/uL (ref 0.0–0.1)
EOS PCT: 4.1 % (ref 0.0–5.0)
Eosinophils Absolute: 0.2 10*3/uL (ref 0.0–0.7)
HEMATOCRIT: 42.3 % (ref 39.0–52.0)
Hemoglobin: 14.3 g/dL (ref 13.0–17.0)
Lymphocytes Relative: 34.5 % (ref 12.0–46.0)
Lymphs Abs: 2 10*3/uL (ref 0.7–4.0)
MCHC: 33.8 g/dL (ref 30.0–36.0)
MCV: 90.5 fl (ref 78.0–100.0)
MONO ABS: 0.5 10*3/uL (ref 0.1–1.0)
Monocytes Relative: 8.6 % (ref 3.0–12.0)
Neutro Abs: 3 10*3/uL (ref 1.4–7.7)
Neutrophils Relative %: 52.2 % (ref 43.0–77.0)
PLATELETS: 188 10*3/uL (ref 150.0–400.0)
RBC: 4.67 Mil/uL (ref 4.22–5.81)
RDW: 13.3 % (ref 11.5–15.5)
WBC: 5.8 10*3/uL (ref 4.0–10.5)

## 2013-11-05 LAB — POCT URINALYSIS DIPSTICK
Bilirubin, UA: NEGATIVE
Glucose, UA: NEGATIVE
Ketones, UA: NEGATIVE
Leukocytes, UA: NEGATIVE
NITRITE UA: NEGATIVE
PH UA: 6.5
PROTEIN UA: NEGATIVE
RBC UA: NEGATIVE
Spec Grav, UA: 1.02
Urobilinogen, UA: 0.2

## 2013-11-05 LAB — HEPATIC FUNCTION PANEL
ALT: 22 U/L (ref 0–53)
AST: 20 U/L (ref 0–37)
Albumin: 4.5 g/dL (ref 3.5–5.2)
Alkaline Phosphatase: 52 U/L (ref 39–117)
BILIRUBIN DIRECT: 0.1 mg/dL (ref 0.0–0.3)
Total Bilirubin: 0.8 mg/dL (ref 0.2–1.2)
Total Protein: 7.4 g/dL (ref 6.0–8.3)

## 2013-11-05 LAB — BASIC METABOLIC PANEL
BUN: 19 mg/dL (ref 6–23)
CO2: 30 mEq/L (ref 19–32)
Calcium: 9.5 mg/dL (ref 8.4–10.5)
Chloride: 104 mEq/L (ref 96–112)
Creatinine, Ser: 0.9 mg/dL (ref 0.4–1.5)
GFR: 89.29 mL/min (ref >60.00)
GLUCOSE: 126 mg/dL — AB (ref 70–99)
Potassium: 4.2 mEq/L (ref 3.5–5.1)
Sodium: 140 mEq/L (ref 135–145)

## 2013-11-05 NOTE — Progress Notes (Signed)
Pre visit review using our clinic review tool, if applicable. No additional management support is needed unless otherwise documented below in the visit note. 

## 2013-11-05 NOTE — Patient Instructions (Signed)
Labs today  Lactose free diet  If over the next week to 10 days your symptoms abate then the diagnosis is lactase deficiency......... if however the symptoms did not abate call GI for consultation and further evaluation

## 2013-11-05 NOTE — Progress Notes (Signed)
   Subjective:    Patient ID: Darlen Roundharles J Lofton, male    DOB: January 26, 1956, 58 y.o.   MRN: [id number]  HPI Leonette MostCharles is a 58 year old married male nonsmoker who comes in with a 3 month history of abdominal pain and intermittent rectal bleeding  He states he abdominal pain start about 3 months ago. He says it's constant and it's a dull ache. He points to his right and left lower quadrants as a source of the discomfort. He says he has a lot of gas belching burping indigestion only 2 bowel movements daily no constipation no diarrhea but twice a week or so he'll have some bright red rectal bleeding.  He's had no fever chills nausea vomiting diarrhea or weight loss. The pain does not wake him up at night  Family history negative  Previous history pyloric stenosis as an infant scar in the abdomen from the umbilicus to the midepigastric area.  Colonoscopy 3-4 years ago by Dr. Jarold MottoPatterson normal   Review of Systems    review of systems otherwise negative Objective:   Physical Exam  Well-developed and nourished male no acute distress vital signs stable he is afebrile examination the abdomen in the supine position is a scar in the midline from the umbilicus to the midepigastric area. The abdomen is soft bowel sounds are normal liver spleen kidneys not enlarged I can palpate no masses. Rectal exam normal prostate normal stool guaiac-negative      Assessment & Plan:  3 months history of abdominal pain with bright red rectal bleeding...........Marland Kitchen

## 2013-11-11 ENCOUNTER — Telehealth: Payer: Self-pay | Admitting: Family Medicine

## 2013-11-11 NOTE — Telephone Encounter (Signed)
Pt is wanting to know if results are in from his labs that were done on last Monday.

## 2013-12-25 ENCOUNTER — Ambulatory Visit (INDEPENDENT_AMBULATORY_CARE_PROVIDER_SITE_OTHER): Payer: BC Managed Care – PPO | Admitting: Ophthalmology

## 2013-12-25 DIAGNOSIS — H348192 Central retinal vein occlusion, unspecified eye, stable: Secondary | ICD-10-CM

## 2013-12-25 DIAGNOSIS — H348392 Tributary (branch) retinal vein occlusion, unspecified eye, stable: Secondary | ICD-10-CM

## 2013-12-25 DIAGNOSIS — H43819 Vitreous degeneration, unspecified eye: Secondary | ICD-10-CM

## 2013-12-25 DIAGNOSIS — H35039 Hypertensive retinopathy, unspecified eye: Secondary | ICD-10-CM

## 2013-12-25 DIAGNOSIS — I10 Essential (primary) hypertension: Secondary | ICD-10-CM

## 2013-12-25 DIAGNOSIS — H2510 Age-related nuclear cataract, unspecified eye: Secondary | ICD-10-CM

## 2013-12-31 ENCOUNTER — Ambulatory Visit (INDEPENDENT_AMBULATORY_CARE_PROVIDER_SITE_OTHER): Payer: BC Managed Care – PPO | Admitting: Family Medicine

## 2013-12-31 ENCOUNTER — Encounter: Payer: Self-pay | Admitting: Family Medicine

## 2013-12-31 VITALS — BP 130/80 | Temp 98.0°F | Wt 297.0 lb

## 2013-12-31 DIAGNOSIS — E669 Obesity, unspecified: Secondary | ICD-10-CM

## 2013-12-31 DIAGNOSIS — I10 Essential (primary) hypertension: Secondary | ICD-10-CM

## 2013-12-31 DIAGNOSIS — K219 Gastro-esophageal reflux disease without esophagitis: Secondary | ICD-10-CM

## 2013-12-31 NOTE — Patient Instructions (Signed)
Walk daily  Do your back exercises daily  I will call GI and you to set up for a consult to help determine the cause of your tummy problems  Set up a time the second week in December for your annual checkup  Labs one week prior

## 2013-12-31 NOTE — Progress Notes (Signed)
Pre visit review using our clinic review tool, if applicable. No additional management support is needed unless otherwise documented below in the visit note. 

## 2013-12-31 NOTE — Progress Notes (Signed)
   Subjective:    Patient ID: Ross [name], male    DOB: 03/11/1956, 58 y.o.   MRN: [id number]  HPI Ross [name] is a 58 year old married male nonsmoker who comes in today for evaluation of multiple issues  We saw him last winter for physical examination he was having a lot of GI upset. Exam at that time was negative. We put him on Prilosec daily along with a lactose free diet however is still having a lot of upset stomach belching burping indigestion. He had his last colonoscopy by Dr. Jarold MottoPatterson.  He sees Dr. Ashley RoyaltyMatthews frequently because of glaucoma. Last pressures were 17. He's having trouble with left knee. [facility] was told was a torn cartilage  We also saw him this spring for low back pain. We put him on an exercise program come involve physical therapy and he says his back is much much better.  Due for his annual checkup in December   Review of Systems Review of systems negative    Objective:   Physical Exam  Well-developed and nourished male no acute distress vital signs stable is afebrile BP 130/80      Assessment & Plan:  Hypertension at goal........ continue current therapy followup in December  Persistent GI symptoms............ GI consult for further evaluation  Low back pain............. daily exercise as outlined  Left knee pain......... followed by or so.

## 2014-01-01 ENCOUNTER — Telehealth: Payer: Self-pay | Admitting: Family Medicine

## 2014-01-01 NOTE — Telephone Encounter (Signed)
Relevant patient education assigned to patient using Emmi. ° °

## 2014-01-02 ENCOUNTER — Encounter: Payer: Self-pay | Admitting: Gastroenterology

## 2014-02-10 ENCOUNTER — Encounter: Payer: Self-pay | Admitting: Gastroenterology

## 2014-03-05 ENCOUNTER — Encounter: Payer: Self-pay | Admitting: Gastroenterology

## 2014-03-05 ENCOUNTER — Other Ambulatory Visit (INDEPENDENT_AMBULATORY_CARE_PROVIDER_SITE_OTHER): Payer: BC Managed Care – PPO

## 2014-03-05 ENCOUNTER — Ambulatory Visit (INDEPENDENT_AMBULATORY_CARE_PROVIDER_SITE_OTHER): Payer: BC Managed Care – PPO | Admitting: Gastroenterology

## 2014-03-05 VITALS — BP 140/90 | HR 76 | Ht 72.25 in | Wt 290.1 lb

## 2014-03-05 DIAGNOSIS — K625 Hemorrhage of anus and rectum: Secondary | ICD-10-CM

## 2014-03-05 DIAGNOSIS — R194 Change in bowel habit: Secondary | ICD-10-CM

## 2014-03-05 DIAGNOSIS — R143 Flatulence: Secondary | ICD-10-CM

## 2014-03-05 DIAGNOSIS — R140 Abdominal distension (gaseous): Secondary | ICD-10-CM

## 2014-03-05 DIAGNOSIS — R1084 Generalized abdominal pain: Secondary | ICD-10-CM

## 2014-03-05 DIAGNOSIS — IMO0001 Reserved for inherently not codable concepts without codable children: Secondary | ICD-10-CM

## 2014-03-05 LAB — CBC WITH DIFFERENTIAL/PLATELET
BASOS ABS: 0.1 10*3/uL (ref 0.0–0.1)
Basophils Relative: 1 % (ref 0.0–3.0)
EOS ABS: 0.2 10*3/uL (ref 0.0–0.7)
Eosinophils Relative: 3 % (ref 0.0–5.0)
HCT: 38.1 % — ABNORMAL LOW (ref 39.0–52.0)
Hemoglobin: 13 g/dL (ref 13.0–17.0)
Lymphocytes Relative: 33.1 % (ref 12.0–46.0)
Lymphs Abs: 2.1 10*3/uL (ref 0.7–4.0)
MCHC: 34.1 g/dL (ref 30.0–36.0)
MCV: 88.9 fl (ref 78.0–100.0)
MONO ABS: 0.4 10*3/uL (ref 0.1–1.0)
Monocytes Relative: 6.9 % (ref 3.0–12.0)
NEUTROS PCT: 56 % (ref 43.0–77.0)
Neutro Abs: 3.6 10*3/uL (ref 1.4–7.7)
PLATELETS: 181 10*3/uL (ref 150.0–400.0)
RBC: 4.29 Mil/uL (ref 4.22–5.81)
RDW: 12.8 % (ref 11.5–15.5)
WBC: 6.4 10*3/uL (ref 4.0–10.5)

## 2014-03-05 LAB — COMPREHENSIVE METABOLIC PANEL
ALBUMIN: 4 g/dL (ref 3.5–5.2)
ALK PHOS: 58 U/L (ref 39–117)
ALT: 31 U/L (ref 0–53)
AST: 24 U/L (ref 0–37)
BUN: 22 mg/dL (ref 6–23)
CO2: 24 mEq/L (ref 19–32)
Calcium: 9.4 mg/dL (ref 8.4–10.5)
Chloride: 109 mEq/L (ref 96–112)
Creatinine, Ser: 1.1 mg/dL (ref 0.4–1.5)
GFR: 74.92 mL/min (ref >60.00)
Glucose, Bld: 98 mg/dL (ref 70–99)
POTASSIUM: 4 meq/L (ref 3.5–5.1)
Sodium: 145 mEq/L (ref 135–145)
Total Bilirubin: 0.9 mg/dL (ref 0.2–1.2)
Total Protein: 7.4 g/dL (ref 6.0–8.3)

## 2014-03-05 NOTE — Assessment & Plan Note (Signed)
Patient reports a 12 month history of change in bowel habits primarily characterized by large amounts of excess gas.  This could be do to an outside ingestion of certain food products.  There no Lyme symptoms suggestive of a structural anomaly the colon.  Recommendations #1 patient was instructed to take a careful dietary history #2 simethicone as needed #3 Hemoccults #4 check CBC and comprehensive metabolic profile

## 2014-03-05 NOTE — Patient Instructions (Signed)
Go to the basement for labs today Take a Dietary history with gas symptoms Use Gas X as needed  Follow up in 6 weeks

## 2014-03-05 NOTE — Progress Notes (Signed)
_                                                                                                                History of Present Illness:  [name] is a 58 year old white male referred for evaluation of excess gas.  Over the past year he's developed very frequent bouts of excess gas and occasional loose stools.  There clearly been a change in the absence of change in diet or medications.  He denies rectal bleeding.  Family history is pertinent for a pair with colon cancer.  Last colonoscopy in 2012 was pertinent for internal hemorrhoids.  Endoscopy in 2012 demonstrated a pyloroplasty which was done for pyloric stenosis.  Weight has been stable.  This did not improve while abstaining from milk.  He is unaware of any particular foods that cause symptoms.  Severe excess gas is now daily occurrence.  He is having more frequent loose stools.  He rarely has rectal soreness in may see blood on the toilet tissue which he attributes to hemorrhoids.  Past Medical History  Diagnosis Date  . Family history of malignant neoplasm of gastrointestinal tract     mother  . Sleep apnea   . Gout   . Hypertension   . DDD (degenerative disc disease)   . GERD (gastroesophageal reflux disease)   . Rectal bleeding   . Hemorrhoids   . Fatty liver   . Anal fissure   . Arthritis   . Glaucoma   . Stroke     in eye   Past Surgical History  Procedure Laterality Date  . Tonsillectomy    . Lumbar disc surgery      L5  . Pyloroplasty      pyloric stenosis, as infant  . Tonsillectomy and adenoidectomy    . Eye surgery Right     laser   . Glaucoma surgery Bilateral    family history includes Breast cancer in his mother; Colon cancer in his mother; Diabetes in his maternal grandmother; Heart disease in his maternal grandfather and maternal grandmother; Prostate cancer in his father; Stomach cancer in his paternal grandmother; Testicular cancer in his father. Current Outpatient Prescriptions    Medication Sig Dispense Refill  . aspirin 81 MG tablet Take 81 mg by mouth daily.        Marland Kitchen. losartan-hydrochlorothiazide (HYZAAR) 100-12.5 MG per tablet Take 1 tablet by mouth daily.  100 tablet  3  . Multiple Vitamin (MULTIVITAMIN) tablet Take 1 tablet by mouth. Once or twice a week      . omeprazole (PRILOSEC) 40 MG capsule 1 by mouth every morning  100 capsule  3  . sildenafil (VIAGRA) 100 MG tablet Take 0.5-1 tablets (50-100 mg total) by mouth daily as needed for erectile dysfunction.  10 tablet  11  . allopurinol (ZYLOPRIM) 300 MG tablet TAKE 1 TABLET BY MOUTH EVERY DAY  100 tablet  3   No current facility-administered medications for this visit.   Allergies as of 03/05/2014  . (  No Known Allergies)    reports that he quit smoking about 22 years ago. His smoking use included Cigarettes. He has a 20 pack-year smoking history. He has never used smokeless tobacco. He reports that he drinks about one ounce of alcohol per week. He reports that he does not use illicit drugs.   Review of Systems: Pertinent positive and negative review of systems were noted in the above HPI section. All other review of systems were otherwise negative.  Vital signs were reviewed in today's medical record Physical Exam: General: Well developed , well nourished, no acute distress Skin: anicteric Head: Normocephalic and atraumatic Eyes:  sclerae anicteric, EOMI Ears: Normal auditory acuity Mouth: No deformity or lesions Neck: Supple, no masses or thyromegaly Lungs: Clear throughout to auscultation Heart: Regular rate and rhythm; no murmurs, rubs or bruits Abdomen: Soft, non tender and non distended. No masses, hepatosplenomegaly or hernias noted. Normal Bowel sounds Rectal:deferred Musculoskeletal: Symmetrical with no gross deformities  Skin: No lesions on visible extremities Pulses:  Normal pulses noted Extremities: No clubbing, cyanosis, edema or deformities noted Neurological: Alert oriented x 4,  grossly nonfocal Cervical Nodes:  No significant cervical adenopathy Inguinal Nodes: No significant inguinal adenopathy Psychological:  Alert and cooperative. Normal mood and affect  See Assessment and Plan under Problem List

## 2014-03-05 NOTE — Assessment & Plan Note (Signed)
Rare spotting of blood is probably related to hemorrhoids

## 2014-03-06 LAB — TISSUE TRANSGLUTAMINASE, IGA: Tissue Transglutaminase Ab, IgA: 3.5 U/mL (ref <20)

## 2014-03-06 LAB — GLIADIN ANTIBODIES, SERUM
GLIADIN IGA: 4 U/mL (ref <20)
Gliadin IgG: 7.2 U/mL (ref <20)

## 2014-03-07 LAB — RETICULIN ANTIBODIES, IGA W TITER: Reticulin Ab, IgA: NEGATIVE

## 2014-03-10 NOTE — Progress Notes (Signed)
Quick Note:  Please inform the patient that lab work was normal and to continue current plan of action ______ 

## 2014-04-07 ENCOUNTER — Other Ambulatory Visit (INDEPENDENT_AMBULATORY_CARE_PROVIDER_SITE_OTHER): Payer: BC Managed Care – PPO

## 2014-04-07 DIAGNOSIS — R140 Abdominal distension (gaseous): Secondary | ICD-10-CM

## 2014-04-07 DIAGNOSIS — R1084 Generalized abdominal pain: Secondary | ICD-10-CM

## 2014-04-07 DIAGNOSIS — R143 Flatulence: Secondary | ICD-10-CM

## 2014-04-07 DIAGNOSIS — IMO0001 Reserved for inherently not codable concepts without codable children: Secondary | ICD-10-CM

## 2014-04-07 LAB — FECAL OCCULT BLOOD, IMMUNOCHEMICAL: Fecal Occult Bld: NEGATIVE

## 2014-04-14 NOTE — Progress Notes (Signed)
Quick Note:  Please inform the patient that hemeoccult was normal negative. No further GI workup ______

## 2014-05-05 ENCOUNTER — Other Ambulatory Visit (INDEPENDENT_AMBULATORY_CARE_PROVIDER_SITE_OTHER): Payer: BC Managed Care – PPO

## 2014-05-05 DIAGNOSIS — K219 Gastro-esophageal reflux disease without esophagitis: Secondary | ICD-10-CM

## 2014-05-05 DIAGNOSIS — E669 Obesity, unspecified: Secondary | ICD-10-CM

## 2014-05-05 DIAGNOSIS — I10 Essential (primary) hypertension: Secondary | ICD-10-CM

## 2014-05-05 LAB — LIPID PANEL
Cholesterol: 166 mg/dL (ref 0–200)
HDL: 36 mg/dL — ABNORMAL LOW (ref >39.00)
LDL Cholesterol: 105 mg/dL — ABNORMAL HIGH (ref 0–99)
NONHDL: 130
Total CHOL/HDL Ratio: 5
Triglycerides: 123 mg/dL (ref 0.0–149.0)
VLDL: 24.6 mg/dL (ref 0.0–40.0)

## 2014-05-05 LAB — CBC WITH DIFFERENTIAL/PLATELET
Basophils Absolute: 0 10*3/uL (ref 0.0–0.1)
Basophils Relative: 0.5 % (ref 0.0–3.0)
EOS ABS: 0.4 10*3/uL (ref 0.0–0.7)
Eosinophils Relative: 4.8 % (ref 0.0–5.0)
HCT: 42.3 % (ref 39.0–52.0)
Hemoglobin: 14.1 g/dL (ref 13.0–17.0)
LYMPHS PCT: 39.2 % (ref 12.0–46.0)
Lymphs Abs: 2.9 10*3/uL (ref 0.7–4.0)
MCHC: 33.4 g/dL (ref 30.0–36.0)
MCV: 89.9 fl (ref 78.0–100.0)
Monocytes Absolute: 0.6 10*3/uL (ref 0.1–1.0)
Monocytes Relative: 8.8 % (ref 3.0–12.0)
NEUTROS ABS: 3.4 10*3/uL (ref 1.4–7.7)
NEUTROS PCT: 46.7 % (ref 43.0–77.0)
Platelets: 182 10*3/uL (ref 150.0–400.0)
RBC: 4.7 Mil/uL (ref 4.22–5.81)
RDW: 12.7 % (ref 11.5–15.5)
WBC: 7.3 10*3/uL (ref 4.0–10.5)

## 2014-05-05 LAB — BASIC METABOLIC PANEL
BUN: 23 mg/dL (ref 6–23)
CO2: 26 mEq/L (ref 19–32)
Calcium: 9.2 mg/dL (ref 8.4–10.5)
Chloride: 104 mEq/L (ref 96–112)
Creatinine, Ser: 1 mg/dL (ref 0.4–1.5)
GFR: 84.87 mL/min (ref >60.00)
GLUCOSE: 103 mg/dL — AB (ref 70–99)
Potassium: 3.9 mEq/L (ref 3.5–5.1)
SODIUM: 139 meq/L (ref 135–145)

## 2014-05-05 LAB — HEPATIC FUNCTION PANEL
ALBUMIN: 4.2 g/dL (ref 3.5–5.2)
ALT: 24 U/L (ref 0–53)
AST: 19 U/L (ref 0–37)
Alkaline Phosphatase: 55 U/L (ref 39–117)
Bilirubin, Direct: 0.1 mg/dL (ref 0.0–0.3)
TOTAL PROTEIN: 6.8 g/dL (ref 6.0–8.3)
Total Bilirubin: 0.8 mg/dL (ref 0.2–1.2)

## 2014-05-05 LAB — POCT URINALYSIS DIPSTICK
Bilirubin, UA: NEGATIVE
GLUCOSE UA: NEGATIVE
Ketones, UA: NEGATIVE
Leukocytes, UA: NEGATIVE
NITRITE UA: NEGATIVE
Protein, UA: NEGATIVE
RBC UA: NEGATIVE
Spec Grav, UA: 1.02
UROBILINOGEN UA: 0.2
pH, UA: 5.5

## 2014-05-06 LAB — TSH: TSH: 2.41 u[IU]/mL (ref 0.35–4.50)

## 2014-05-06 LAB — PSA: PSA: 1.18 ng/mL (ref 0.10–4.00)

## 2014-05-15 ENCOUNTER — Encounter: Payer: Self-pay | Admitting: Family Medicine

## 2014-05-19 ENCOUNTER — Ambulatory Visit (INDEPENDENT_AMBULATORY_CARE_PROVIDER_SITE_OTHER): Payer: BLUE CROSS/BLUE SHIELD | Admitting: Family Medicine

## 2014-05-19 ENCOUNTER — Encounter: Payer: Self-pay | Admitting: Family Medicine

## 2014-05-19 VITALS — BP 110/80 | Temp 98.5°F | Ht 73.25 in | Wt 292.0 lb

## 2014-05-19 DIAGNOSIS — M1A00X0 Idiopathic chronic gout, unspecified site, without tophus (tophi): Secondary | ICD-10-CM

## 2014-05-19 DIAGNOSIS — K219 Gastro-esophageal reflux disease without esophagitis: Secondary | ICD-10-CM

## 2014-05-19 DIAGNOSIS — Z0000 Encounter for general adult medical examination without abnormal findings: Secondary | ICD-10-CM | POA: Insufficient documentation

## 2014-05-19 DIAGNOSIS — N529 Male erectile dysfunction, unspecified: Secondary | ICD-10-CM

## 2014-05-19 DIAGNOSIS — M1A9XX0 Chronic gout, unspecified, without tophus (tophi): Secondary | ICD-10-CM

## 2014-05-19 DIAGNOSIS — E669 Obesity, unspecified: Secondary | ICD-10-CM

## 2014-05-19 DIAGNOSIS — N528 Other male erectile dysfunction: Secondary | ICD-10-CM

## 2014-05-19 DIAGNOSIS — I10 Essential (primary) hypertension: Secondary | ICD-10-CM

## 2014-05-19 MED ORDER — SILDENAFIL CITRATE 100 MG PO TABS: 50.0000 mg | ORAL_TABLET | Freq: Every day | ORAL | Status: DC | PRN

## 2014-05-19 MED ORDER — ALLOPURINOL 300 MG PO TABS: ORAL_TABLET | ORAL | Status: DC

## 2014-05-19 MED ORDER — OMEPRAZOLE 40 MG PO CPDR: DELAYED_RELEASE_CAPSULE | ORAL | Status: DC

## 2014-05-19 MED ORDER — LOSARTAN POTASSIUM-HCTZ 100-12.5 MG PO TABS: 1.0000 | ORAL_TABLET | Freq: Every day | ORAL | Status: DC

## 2014-05-19 NOTE — Progress Notes (Signed)
   Subjective:    Patient ID: Ross [name], male    DOB: 1955-05-21, 59 y.o.   MRN: [id number]  HPI Ross [name] is a 59 year old married male nonsmoker who comes in today for evaluation of gout, hypertension, reflux esophagitis, and erectile dysfunction  He says overall he feels well and has no major complaints. He still overweight. Weight is 292 pounds. We again encouraged him to start a diet and exercise program.  He gets routine eye care, dental care, colonoscopy 4 years ago normal, vaccinations updated by [facility]  Cognitive function normal he does not walk daily home health safety reviewed no issues identified, no guns in the house, he does not have a healthcare power of attorney nor living well.   Review of Systems  Constitutional: Negative.   HENT: Negative.   Eyes: Negative.   Respiratory: Negative.   Cardiovascular: Negative.   Gastrointestinal: Negative.   Endocrine: Negative.   Genitourinary: Negative.   Musculoskeletal: Negative.   Skin: Negative.   Allergic/Immunologic: Negative.   Neurological: Negative.   Hematological: Negative.   Psychiatric/Behavioral: Negative.        Objective:   Physical Exam  Constitutional: He is oriented to person, place, and time. He appears well-developed and well-nourished.  HENT:  Head: Normocephalic and atraumatic.  Right Ear: External ear normal.  Left Ear: External ear normal.  Nose: Nose normal.  Mouth/Throat: Oropharynx is clear and moist.  Eyes: Conjunctivae and EOM are normal. Pupils are equal, [name], and reactive to light.  Neck: Normal range of motion. Neck supple. No JVD present. No tracheal deviation present. No thyromegaly present.  Cardiovascular: Normal rate, regular rhythm, normal heart sounds and intact distal pulses.  Exam reveals no gallop and no friction rub.   No murmur heard. No carotid nor aortic bruits peripheral pulses 1+ and symmetrical  Pulmonary/Chest: Effort normal and breath sounds normal. No stridor.  No respiratory distress. He has no wheezes. He has no rales. He exhibits no tenderness.  Abdominal: Soft. Bowel sounds are normal. He exhibits no distension and no mass. There is no tenderness. There is no rebound and no guarding.  Genitourinary: Rectum normal, prostate normal and penis normal. Guaiac negative stool. No penile tenderness.  Musculoskeletal: Normal range of motion. He exhibits no edema or tenderness.  Lymphadenopathy:    He has no cervical adenopathy.  Neurological: He is alert and oriented to person, place, and time. He has normal reflexes. No cranial nerve deficit. He exhibits normal muscle tone.  Skin: Skin is warm and dry. No rash noted. No erythema. No pallor.  Psychiatric: He has a normal mood and affect. His behavior is normal. Judgment and thought content normal.  Nursing note and vitals reviewed.         Assessment & Plan:  Overweight,,,,,,,,,,,, again discussed diet exercise and weight loss  History of gout,,,,,,,,,,,, continue allopurinol  Hypertension at goal BP 110/80,,,,,,,, continue Hyzaar 100-12 0.5 daily  Chronic reflux ............ Prilosec 40 mg daily  Erectile dysfunction.......Marland Kitchen Viagra 100 mg when necessary

## 2014-05-19 NOTE — Patient Instructions (Signed)
Start a diet and exercise program,,,,,,,,,,,, join the [facility],,,,,,, begin by walking 30 minutes daily  Return in one year sooner if any problems  Continue your current medications

## 2014-05-19 NOTE — Progress Notes (Signed)
Pre visit review using our clinic review tool, if applicable. No additional management support is needed unless otherwise documented below in the visit note. 

## 2014-10-02 ENCOUNTER — Ambulatory Visit (INDEPENDENT_AMBULATORY_CARE_PROVIDER_SITE_OTHER): Payer: BLUE CROSS/BLUE SHIELD | Admitting: Emergency Medicine

## 2014-10-02 VITALS — BP 122/70 | HR 86 | Temp 98.2°F | Resp 16 | Ht 72.5 in | Wt 287.0 lb

## 2014-10-02 DIAGNOSIS — J0140 Acute pansinusitis, unspecified: Secondary | ICD-10-CM

## 2014-10-02 DIAGNOSIS — J209 Acute bronchitis, unspecified: Secondary | ICD-10-CM | POA: Diagnosis not present

## 2014-10-02 MED ORDER — HYDROCOD POLST-CPM POLST ER 10-8 MG/5ML PO SUER: 5.0000 mL | Freq: Two times a day (BID) | ORAL | Status: DC

## 2014-10-02 MED ORDER — AMOXICILLIN-POT CLAVULANATE 875-125 MG PO TABS: 1.0000 | ORAL_TABLET | Freq: Two times a day (BID) | ORAL | Status: DC

## 2014-10-02 MED ORDER — PSEUDOEPHEDRINE-GUAIFENESIN ER 60-600 MG PO TB12: 1.0000 | ORAL_TABLET | Freq: Two times a day (BID) | ORAL | Status: DC

## 2014-10-02 NOTE — Progress Notes (Signed)
Subjective:  Patient ID: Ross [name], male    DOB: 1956/01/24  Age: 59 y.o. MRN: [id number]  CC: Sore Throat; Sinusitis; Fatigue; Chills; and Cough   HPI Ross RoundCharles J Braaksma presents for 5 day illness. He has what he believed was a cold. He now has chills but no fever. He has no sore throat. Has nasal congestion and postnasal drainage that is a mucopurulent character. He said that he feels an exertional shortness of breath but has no wheezing no nausea vomiting stool change. No rash. He has no improvement with over-the-counter medication. He denies any other medical problem.  Outpatient Prescriptions Prior to Visit  Medication Sig Dispense Refill  . allopurinol (ZYLOPRIM) 300 MG tablet TAKE 1 TABLET BY MOUTH EVERY DAY 100 tablet 3  . aspirin 81 MG tablet Take 81 mg by mouth daily.      Marland Kitchen. losartan-hydrochlorothiazide (HYZAAR) 100-12.5 MG per tablet Take 1 tablet by mouth daily. 100 tablet 3  . Multiple Vitamin (MULTIVITAMIN) tablet Take 1 tablet by mouth. Once or twice a week    . omeprazole (PRILOSEC) 40 MG capsule 1 by mouth every morning 100 capsule 3  . sildenafil (VIAGRA) 100 MG tablet Take 0.5-1 tablets (50-100 mg total) by mouth daily as needed for erectile dysfunction. 10 tablet 11   No facility-administered medications prior to visit.    ROS Review of Systems  Constitutional: Negative for fever, chills and appetite change.  HENT: Negative for congestion, ear pain, postnasal drip, sinus pressure and sore throat.   Eyes: Negative for pain and redness.  Respiratory: Positive for cough and shortness of breath. Negative for wheezing.   Cardiovascular: Negative for leg swelling.  Gastrointestinal: Negative for nausea, vomiting, abdominal pain, diarrhea, constipation and blood in stool.  Endocrine: Negative for polyuria.  Genitourinary: Negative for dysuria, urgency, frequency and flank pain.  Musculoskeletal: Negative for gait problem.  Skin: Negative for rash.  Neurological:  Negative for weakness and headaches.  Psychiatric/Behavioral: Negative for confusion and decreased concentration. The patient is not nervous/anxious.     Objective:  BP 122/70 mmHg  Pulse 86  Temp(Src) 98.2 F (36.8 C) (Oral)  Resp 16  Ht 6' 0.5" (1.842 m)  Wt 287 lb (130.182 kg)  BMI 38.37 kg/m2  SpO2 95%  BP Readings from Last 3 Encounters:  10/02/14 122/70  05/19/14 110/80  03/05/14 140/90    Wt Readings from Last 3 Encounters:  10/02/14 287 lb (130.182 kg)  05/19/14 292 lb (132.45 kg)  03/05/14 290 lb 2 oz (131.6 kg)    Physical Exam  Constitutional: He is oriented to person, place, and time. He appears well-developed and well-nourished. No distress.  HENT:  Head: Normocephalic and atraumatic.  Right Ear: External ear normal.  Left Ear: External ear normal.  Nose: Nose normal.  Eyes: Conjunctivae and EOM are normal. Pupils are equal, round, and reactive to light. No scleral icterus.  Neck: Normal range of motion. Neck supple. No tracheal deviation present.  Cardiovascular: Normal rate, regular rhythm and normal heart sounds.   Pulmonary/Chest: Effort normal. No respiratory distress. He has no wheezes. He has no rales.  Abdominal: He exhibits no mass. There is no tenderness. There is no rebound and no guarding.  Musculoskeletal: He exhibits no edema.  Lymphadenopathy:    He has no cervical adenopathy.  Neurological: He is alert and oriented to person, place, and time. Coordination normal.  Skin: Skin is warm and dry. No rash noted.  Psychiatric: He has a normal mood  and affect. His behavior is normal.    Lab Results  Component Value Date   WBC 7.3 05/05/2014   HGB 14.1 05/05/2014   HCT 42.3 05/05/2014   PLT 182.0 05/05/2014   GLUCOSE 103* 05/05/2014   CHOL 166 05/05/2014   TRIG 123.0 05/05/2014   HDL 36.00* 05/05/2014   LDLCALC 105* 05/05/2014   ALT 24 05/05/2014   AST 19 05/05/2014   NA 139 05/05/2014   K 3.9 05/05/2014   CL 104 05/05/2014    CREATININE 1.0 05/05/2014   BUN 23 05/05/2014   CO2 26 05/05/2014   TSH 2.41 05/05/2014   PSA 1.18 05/05/2014   INR 0.93 02/15/2011      Assessment & Plan:   Ross [name] was seen today for sore throat, sinusitis, fatigue, chills and cough.  Diagnoses and all orders for this visit:  Acute bronchitis, unspecified organism  Acute pansinusitis, recurrence not specified  Other orders -     amoxicillin-clavulanate (AUGMENTIN) 875-125 MG per tablet; Take 1 tablet by mouth 2 (two) times daily. -     pseudoephedrine-guaifenesin (MUCINEX D) 60-600 MG per tablet; Take 1 tablet by mouth every 12 (twelve) hours. -     chlorpheniramine-HYDROcodone (TUSSIONEX PENNKINETIC ER) 10-8 MG/5ML SUER; Take 5 mLs by mouth 2 (two) times daily.   I am having Ross [name] start on amoxicillin-clavulanate, pseudoephedrine-guaifenesin, and chlorpheniramine-HYDROcodone. I am also having him maintain his aspirin, multivitamin, allopurinol, losartan-hydrochlorothiazide, omeprazole, and sildenafil.  Meds ordered this encounter  Medications  . amoxicillin-clavulanate (AUGMENTIN) 875-125 MG per tablet    Sig: Take 1 tablet by mouth 2 (two) times daily.    Dispense:  20 tablet    Refill:  0  . pseudoephedrine-guaifenesin (MUCINEX D) 60-600 MG per tablet    Sig: Take 1 tablet by mouth every 12 (twelve) hours.    Dispense:  18 tablet    Refill:  0  . chlorpheniramine-HYDROcodone (TUSSIONEX PENNKINETIC ER) 10-8 MG/5ML SUER    Sig: Take 5 mLs by mouth 2 (two) times daily.    Dispense:  60 mL    Refill:  0     Follow-up: Return if symptoms worsen or fail to improve.  Carmelina DaneAnderson, Mattix Imhof S, MD

## 2014-10-02 NOTE — Patient Instructions (Signed)

## 2014-10-30 ENCOUNTER — Ambulatory Visit (INDEPENDENT_AMBULATORY_CARE_PROVIDER_SITE_OTHER): Payer: BLUE CROSS/BLUE SHIELD | Admitting: Family Medicine

## 2014-10-30 VITALS — BP 138/88 | HR 90 | Temp 98.3°F | Resp 18 | Ht 73.0 in | Wt 281.6 lb

## 2014-10-30 DIAGNOSIS — A084 Viral intestinal infection, unspecified: Secondary | ICD-10-CM

## 2014-10-30 NOTE — Patient Instructions (Signed)
Stay on clear liquids today (trough, Gatorade, ginger ale) and resume your regular diet tomorrow. Please return if symptoms do not clear in the next 24 hours Probiotics are also good choice. 2 of the better products are align and culturelle   Viral Gastroenteritis Viral gastroenteritis is also known as stomach flu. This condition affects the stomach and intestinal tract. It can cause sudden diarrhea and vomiting. The illness typically lasts 3 to 8 days. Most people develop an immune response that eventually gets rid of the virus. While this natural response develops, the virus can make you quite ill. CAUSES  Many different viruses can cause gastroenteritis, such as rotavirus or noroviruses. You can catch one of these viruses by consuming contaminated food or water. You may also catch a virus by sharing utensils or other personal items with an infected person or by touching a contaminated surface. SYMPTOMS  The most common symptoms are diarrhea and vomiting. These problems can cause a severe loss of body fluids (dehydration) and a body salt (electrolyte) imbalance. Other symptoms may include:  Fever.  Headache.  Fatigue.  Abdominal pain. DIAGNOSIS  Your caregiver can usually diagnose viral gastroenteritis based on your symptoms and a physical exam. A stool sample may also be taken to test for the presence of viruses or other infections. TREATMENT  This illness typically goes away on its own. Treatments are aimed at rehydration. The most serious cases of viral gastroenteritis involve vomiting so severely that you are not able to keep fluids down. In these cases, fluids must be given through an intravenous line (IV). HOME CARE INSTRUCTIONS   Drink enough fluids to keep your urine clear or pale yellow. Drink small amounts of fluids frequently and increase the amounts as tolerated.  Ask your caregiver for specific rehydration instructions.  Avoid:  Foods high in  sugar.  Alcohol.  Carbonated drinks.  Tobacco.  Juice.  Caffeine drinks.  Extremely hot or cold fluids.  Fatty, greasy foods.  Too much intake of anything at one time.  Dairy products until 24 to 48 hours after diarrhea stops.  You may consume probiotics. Probiotics are active cultures of beneficial bacteria. They may lessen the amount and number of diarrheal stools in adults. Probiotics can be found in yogurt with active cultures and in supplements.  Wash your hands well to avoid spreading the virus.  Only take over-the-counter or prescription medicines for pain, discomfort, or fever as directed by your caregiver. Do not give aspirin to children. Antidiarrheal medicines are not recommended.  Ask your caregiver if you should continue to take your regular prescribed and over-the-counter medicines.  Keep all follow-up appointments as directed by your caregiver. SEEK IMMEDIATE MEDICAL CARE IF:   You are unable to keep fluids down.  You do not urinate at least once every 6 to 8 hours.  You develop shortness of breath.  You notice blood in your stool or vomit. This may look like coffee grounds.  You have abdominal pain that increases or is concentrated in one small area (localized).  You have persistent vomiting or diarrhea.  You have a fever.  The patient is a child younger than 3 months, and he or she has a fever.  The patient is a child older than 3 months, and he or she has a fever and persistent symptoms.  The patient is a child older than 3 months, and he or she has a fever and symptoms suddenly get worse.  The patient is a baby, and he or  she has no tears when crying. MAKE SURE YOU:   Understand these instructions.  Will watch your condition.  Will get help right away if you are not doing well or get worse. Document Released: 05/02/2005 Document Revised: 07/25/2011 Document Reviewed: 02/16/2011 [facility] Patient Information 2015 [place], [place]. This  information is not intended to replace advice given to you by your health care provider. Make sure you discuss any questions you have with your health care provider.

## 2014-10-30 NOTE — Progress Notes (Signed)
° °  Subjective:  This chart was scribed for Ross Sidle, MD, by Elon Spanner, ED Scribe. This patient was seen in room 10 and the patient's care was started at 10:26 AM.   Patient ID: Ross [name], male    DOB: 05-11-1956, 59 y.o.   MRN: [id number]  HPI HPI Comments: Ross [name] is a 59 y.o. male who presents to [facility] complaining of nausea, vomiting, and diarrhea onset 9 hours ago while the patient was half way through his 12 hour shift.  His last episode was several hours ago but he still reports some "grumbling and queasiness" in his stomach. He ate [employer] yesterday but is unsure if his complaints are derived from that  He also notes some lightheadedness currently but attributes this to fatigue resulting form him being awake since 3:00 pm yesterday.    Patient works at the [employer].    Review of Systems  Gastrointestinal: Positive for nausea, vomiting and diarrhea.       Objective:   Physical Exam  Constitutional: He is oriented to person, place, and time. He appears well-developed and well-nourished. No distress.  HENT:  Head: Normocephalic and atraumatic.  Eyes: Conjunctivae and EOM are normal. Pupils are equal, [name], and reactive to light. No scleral icterus.  Neck: Neck supple. No thyromegaly present.  Cardiovascular: Normal rate, regular rhythm, normal heart sounds and intact distal pulses.   Pulmonary/Chest: Effort normal and breath sounds normal. No respiratory distress.  Abdominal: Soft. He exhibits no distension and no mass. There is no tenderness. There is no rebound and no guarding.  Hyperactive bowel sounds  Musculoskeletal: Normal range of motion. He exhibits no edema or tenderness.  Lymphadenopathy:    He has no cervical adenopathy.  Neurological: He is alert and oriented to person, place, and time.  Skin: Skin is warm and dry.  Psychiatric: He has a normal mood and affect. His behavior is normal.  Nursing note and vitals  reviewed.         Assessment & Plan:   This chart was scribed in my presence and reviewed by me personally.    ICD-9-CM ICD-10-CM   1. Viral gastroenteritis 008.8 A08.4    Symptoms seem to be clearing rapidly. It may be that he has had a staph gastroenteritis versus norovirus, it's really impossible to tell this point. Nevertheless he seems to be getting better and he is stable from a hydration standpoint. He has no red flags such as fever or blood in stool, so I feel that he can stand work [employer] and resume normal duties next week. He agrees to come back if symptoms do not clear or if they worsen.  Signed, Ross Sidle, MD

## 2014-11-11 ENCOUNTER — Encounter: Payer: Self-pay | Admitting: Family Medicine

## 2014-11-11 ENCOUNTER — Ambulatory Visit (INDEPENDENT_AMBULATORY_CARE_PROVIDER_SITE_OTHER): Payer: BLUE CROSS/BLUE SHIELD | Admitting: Family Medicine

## 2014-11-11 VITALS — BP 118/80 | HR 80 | Temp 98.6°F | Wt 289.0 lb

## 2014-11-11 DIAGNOSIS — R194 Change in bowel habit: Secondary | ICD-10-CM

## 2014-11-11 DIAGNOSIS — G8929 Other chronic pain: Secondary | ICD-10-CM

## 2014-11-11 DIAGNOSIS — R1032 Left lower quadrant pain: Secondary | ICD-10-CM

## 2014-11-11 DIAGNOSIS — R1031 Right lower quadrant pain: Secondary | ICD-10-CM | POA: Diagnosis not present

## 2014-11-11 DIAGNOSIS — R1030 Lower abdominal pain, unspecified: Secondary | ICD-10-CM

## 2014-11-11 LAB — BASIC METABOLIC PANEL
BUN: 17 mg/dL (ref 6–23)
CALCIUM: 9.6 mg/dL (ref 8.4–10.5)
CO2: 31 meq/L (ref 19–32)
CREATININE: 0.94 mg/dL (ref 0.40–1.50)
Chloride: 103 mEq/L (ref 96–112)
GFR: 86.8 mL/min (ref >60.00)
GLUCOSE: 114 mg/dL — AB (ref 70–99)
Potassium: 4.6 mEq/L (ref 3.5–5.1)
Sodium: 142 mEq/L (ref 135–145)

## 2014-11-11 NOTE — Patient Instructions (Signed)
Send for CT scan. I suspect this is going to be irritable bowel syndrome but to further evaluate other causes, we jointly decided to do CT scan.   If scan does not find a cause of pain, likely get you back to GI  Follow up with Dr. Tawanna Coolerodd upon his return

## 2014-11-11 NOTE — Progress Notes (Signed)
Tana Conch, MD  Subjective:  Ross [name] is a 59 y.o. year old very pleasant male patient who presents with:  Chronic lower abdominal and low back pain Change in bowel habits- a lot of gas and intermittent loose stool -Started with increased level of gas about a year ago. Saw GI. FOBT negative. CBC and CMET largely normal. Several other tests including gliadin antiodies, tissues transglutaminase, reticulin antibody normal at that time.   4 months ago, started to  feel a deep pain in his low back that radiates into lower abdomen. Pain in low back 3/10, dull ache in lower abdomen also 3/10 very deep-cannot push in and reproduce pain.  Loose stool like "soft serve ice cream". No firm stool in 3 weeks. Feels an urgency at times and feels like needs to rush to the bathroom over last 4 months. He will suddenly have to go very quickly. Pain and gas tend to get better with stooling then seems to come back.   Patient concerned about malignancy potentially. Normal colonoscopy 2012 as well EGD for chronic cough. Mom with breast cancer, dad with prostate cancer. PSAs in patient have been normal including 2015. Former smoker quit 25 years ago. 20 pack years but no chest pain, shortness of breath, and area of concern not toward chest.   ROS- no blood in stool, vomiting 1x in last few weeks. Minimal nausea. No blood or bile in emesis. No unintentional weight loss. Some increased fatigue. Increased night sweats compared to previous summers.   Past Medical History- gout, HTN, erectile dysfunction, history of low back surgery  Medications- reviewed and updated Current Outpatient Prescriptions  Medication Sig Dispense Refill  . allopurinol (ZYLOPRIM) 300 MG tablet TAKE 1 TABLET BY MOUTH EVERY DAY 100 tablet 3  . aspirin 81 MG tablet Take 81 mg by mouth daily.      Marland Kitchen losartan-hydrochlorothiazide (HYZAAR) 100-12.5 MG per tablet Take 1 tablet by mouth daily. 100 tablet 3  . Multiple Vitamin (MULTIVITAMIN)  tablet Take 1 tablet by mouth. Once or twice a week    . omeprazole (PRILOSEC) 40 MG capsule 1 by mouth every morning 100 capsule 3  . sildenafil (VIAGRA) 100 MG tablet Take 0.5-1 tablets (50-100 mg total) by mouth daily as needed for erectile dysfunction. (Patient not taking: Reported on 11/11/2014) 10 tablet 11   No current facility-administered medications for this visit.    Objective: BP 118/80 mmHg  Pulse 80  Temp(Src) 98.6 F (37 C)  Wt 289 lb (131.09 kg) Gen: NAD, resting comfortably CV: RRR no murmurs rubs or gallops Lungs: CTAB no crackles, wheeze, rhonchi Abdomen: soft/nontender/nondistended/normal bowel sounds. No rebound or guarding.  Back - Normal skin, Spine with normal alignment and no deformity.  No tenderness to vertebral process palpation.  Paraspinous muscles are mildly tender and without spasm.   Range of motion is full at neck and lumbar sacral regions. Negative Straight leg raise.  Neuro- no saddle anesthesia, 5/5 strength lower extremities, 2+ reflexes Ext: no edema Skin: warm, dry, no rash   Assessment/Plan:  Chronic lower abdominal and low back pain Change in bowel habits- a lot of gas and intermittent loose stool GI workup previously for excess gas about a year ago which was unrevealing. Symptoms have persisted but new symptoms over 4 months including lower abdominal and back pain (does have history of low back surgery and could potentially be arthritis related) as well as urgency of stooling and intermittent diarrhea. Discussed with patient I did not think  malignancy was likely but with his concern best way to evaluate would be CT abd/pelvis w/ contrast which was ordered today. Already up to date on colonoscopy and low risk prostate cancer with last PSA. Reasonable given chronicity of pain. I honestly think IBS is highest on differential with stool urgency, discomfort decreased by stooling and discussed referral back to GI if CT negative for further eval and IBS  treatment.   Also discussed potential this could be arthritis related especially low back aspect. He is obese and discussed need for Weight loss, exercise.   Return precautions advised.   Orders Placed This Encounter  Procedures  . CT Abdomen Pelvis W Contrast    SS/ [facility] [phone] xt 2251/ [employer] / not diabetic no labs req    Standing Status: Future     Number of Occurrences:      Standing Expiration Date: 02/11/2016    Order Specific Question:  Reason for Exam (SYMPTOM  OR DIAGNOSIS REQUIRED)    Answer:  chronic lower abdominal and low back pain for 4 months. 3/10 pain. some fatigue and night sweats    Order Specific Question:  Preferred imaging location?    Answer:  [address]  . Basic metabolic panel    [place]

## 2014-11-14 ENCOUNTER — Ambulatory Visit (INDEPENDENT_AMBULATORY_CARE_PROVIDER_SITE_OTHER)
Admission: RE | Admit: 2014-11-14 | Discharge: 2014-11-14 | Disposition: A | Discharge status: Home / Self Care | Payer: BLUE CROSS/BLUE SHIELD | Source: Ambulatory Visit | Attending: Family Medicine | Admitting: Family Medicine

## 2014-11-14 DIAGNOSIS — R1030 Lower abdominal pain, unspecified: Secondary | ICD-10-CM | POA: Diagnosis not present

## 2014-11-14 MED ORDER — IOHEXOL 300 MG/ML  SOLN
100.0000 mL | Freq: Once | INTRAMUSCULAR | Status: AC | PRN
  Administered 2014-11-14: 100 mL via INTRAVENOUS

## 2014-11-18 ENCOUNTER — Telehealth: Payer: Self-pay

## 2014-11-18 DIAGNOSIS — R1030 Lower abdominal pain, unspecified: Secondary | ICD-10-CM

## 2014-11-18 NOTE — Telephone Encounter (Signed)
Referral entered  

## 2014-11-19 ENCOUNTER — Encounter: Payer: Self-pay | Admitting: Gastroenterology

## 2015-01-07 ENCOUNTER — Ambulatory Visit (INDEPENDENT_AMBULATORY_CARE_PROVIDER_SITE_OTHER): Payer: BLUE CROSS/BLUE SHIELD | Admitting: Ophthalmology

## 2015-01-07 DIAGNOSIS — H35033 Hypertensive retinopathy, bilateral: Secondary | ICD-10-CM | POA: Diagnosis not present

## 2015-01-07 DIAGNOSIS — H2513 Age-related nuclear cataract, bilateral: Secondary | ICD-10-CM

## 2015-01-07 DIAGNOSIS — I10 Essential (primary) hypertension: Secondary | ICD-10-CM | POA: Diagnosis not present

## 2015-01-07 DIAGNOSIS — H34811 Central retinal vein occlusion, right eye: Secondary | ICD-10-CM

## 2015-01-07 DIAGNOSIS — H43813 Vitreous degeneration, bilateral: Secondary | ICD-10-CM

## 2015-01-07 DIAGNOSIS — H34831 Tributary (branch) retinal vein occlusion, right eye: Secondary | ICD-10-CM | POA: Diagnosis not present

## 2015-01-12 ENCOUNTER — Encounter: Payer: Self-pay | Admitting: Family Medicine

## 2015-01-12 ENCOUNTER — Ambulatory Visit (INDEPENDENT_AMBULATORY_CARE_PROVIDER_SITE_OTHER): Payer: BLUE CROSS/BLUE SHIELD | Admitting: Family Medicine

## 2015-01-12 VITALS — BP 120/74 | HR 83 | Temp 98.4°F | Wt 294.0 lb

## 2015-01-12 DIAGNOSIS — N401 Enlarged prostate with lower urinary tract symptoms: Secondary | ICD-10-CM | POA: Diagnosis not present

## 2015-01-12 DIAGNOSIS — G4733 Obstructive sleep apnea (adult) (pediatric): Secondary | ICD-10-CM | POA: Diagnosis not present

## 2015-01-12 DIAGNOSIS — E669 Obesity, unspecified: Secondary | ICD-10-CM

## 2015-01-12 DIAGNOSIS — R351 Nocturia: Secondary | ICD-10-CM

## 2015-01-12 DIAGNOSIS — R5382 Chronic fatigue, unspecified: Secondary | ICD-10-CM

## 2015-01-12 LAB — COMPREHENSIVE METABOLIC PANEL
ALBUMIN: 4.3 g/dL (ref 3.5–5.2)
ALK PHOS: 48 U/L (ref 39–117)
ALT: 22 U/L (ref 0–53)
AST: 18 U/L (ref 0–37)
BUN: 15 mg/dL (ref 6–23)
CALCIUM: 9.2 mg/dL (ref 8.4–10.5)
CHLORIDE: 101 meq/L (ref 96–112)
CO2: 31 mEq/L (ref 19–32)
Creatinine, Ser: 0.94 mg/dL (ref 0.40–1.50)
GFR: 86.75 mL/min (ref >60.00)
Glucose, Bld: 96 mg/dL (ref 70–99)
POTASSIUM: 4.1 meq/L (ref 3.5–5.1)
SODIUM: 139 meq/L (ref 135–145)
TOTAL PROTEIN: 7.2 g/dL (ref 6.0–8.3)
Total Bilirubin: 0.7 mg/dL (ref 0.2–1.2)

## 2015-01-12 LAB — CBC
HCT: 41.5 % (ref 39.0–52.0)
Hemoglobin: 14.1 g/dL (ref 13.0–17.0)
MCHC: 34 g/dL (ref 30.0–36.0)
MCV: 89.7 fl (ref 78.0–100.0)
PLATELETS: 202 10*3/uL (ref 150.0–400.0)
RBC: 4.62 Mil/uL (ref 4.22–5.81)
RDW: 12.3 % (ref 11.5–15.5)
WBC: 8.8 10*3/uL (ref 4.0–10.5)

## 2015-01-12 LAB — TSH: TSH: 1.75 u[IU]/mL (ref 0.35–4.50)

## 2015-01-12 NOTE — Assessment & Plan Note (Addendum)
I strongly suspect the etiology for his fatigue, sleepiness is untreated sleep apnea. Referred him back to sleep medicine at this time. Hopeful on nasal C May be of help. For completeness sake we also checked a CBC, CMET and TSH which were largely normal-this was in relation the patient's concern about his fatigue. We also discussed need for weight loss and how this could relate to his sleep apnea.

## 2015-01-12 NOTE — Assessment & Plan Note (Signed)
Based on symptoms I also strongly suspect BPH. He pees 4-5 times a night. He would like to address his sleep apnea first before considering medications for this.

## 2015-01-12 NOTE — Progress Notes (Signed)
Tana Conch, MD  Subjective:  Ross [name] is a 59 y.o. year old very pleasant male patient who presents with:  Fatigue, sleepiness, snoring Also suspected BPH -Patient states he has a very difficult time sleeping. He is up every 1-2 hours. He does go to urinate at this time but that's not necessarily wake up to urinate. He is sleepy during the daytime. His wife notices that he snores heavily. Sometimes he seems to gasp for air. He nods off during the daytime. He was diagnosed with moderate sleep apnea years ago but could not tolerate the mask. He is very interested in nasal C Potentially.  Concerning urinary stream, he does T4-5 times a night, he complains of a slow start to strain.  ROS- no fever, chills, unintentional weight loss. Does feel pretty sweaty throughout the day. No chest pain or shortness of breath. He is still having some lower abdominal pain and gas issues which he has follow-up with gastroenterology on September 13.  Past Medical History-  Patient Active Problem List   Diagnosis Date Noted  . Essential hypertension 11/27/2009    Priority: Medium  . Obstructive sleep apnea 10/29/2009    Priority: Medium  . Gout 01/25/2007    Priority: Medium  . GERD (gastroesophageal reflux disease) 02/18/2011    Priority: Low  . Obesity 04/02/2009    Priority: Low  . ERECTILE DYSFUNCTION, ORGANIC 01/25/2007    Priority: Low   Medications- reviewed and updated Current Outpatient Prescriptions  Medication Sig Dispense Refill  . allopurinol (ZYLOPRIM) 300 MG tablet TAKE 1 TABLET BY MOUTH EVERY DAY 100 tablet 3  . aspirin 81 MG tablet Take 81 mg by mouth daily.      Marland Kitchen losartan-hydrochlorothiazide (HYZAAR) 100-12.5 MG per tablet Take 1 tablet by mouth daily. 100 tablet 3  . Multiple Vitamin (MULTIVITAMIN) tablet Take 1 tablet by mouth. Once or twice a week    . omeprazole (PRILOSEC) 40 MG capsule 1 by mouth every morning 100 capsule 3  . sildenafil (VIAGRA) 100 MG tablet Take  0.5-1 tablets (50-100 mg total) by mouth daily as needed for erectile dysfunction. (Patient not taking: Reported on 11/11/2014) 10 tablet 11   Objective: BP 120/74 mmHg  Pulse 83  Temp(Src) 98.4 F (36.9 C)  Wt 294 lb (133.358 kg) Gen: NAD, resting comfortably No thyromegaly CV: RRR no murmurs rubs or gallops Lungs: CTAB no crackles, wheeze, rhonchi Abdomen: soft/nontender/nondistended/normal bowel sounds. No rebound or guarding. obese Ext: no edema Skin: warm, dry Neuro: grossly normal, moves all extremities   Assessment/Plan:  Obstructive sleep apnea I strongly suspect the etiology for his fatigue, sleepiness is untreated sleep apnea. Referred him back to sleep medicine at this time. Hopeful on nasal C May be of help. For completeness sake we also checked a CBC, CMET and TSH which were largely normal-this was in relation the patient's concern about his fatigue. We also discussed need for weight loss and how this could relate to his sleep apnea.  Obesity S: Exercise is limited. Eating habits are poor. Wife is concerned about education on nutrition topics A/P: We will refer to nutrition for further counseling.   BPH associated with nocturia Based on symptoms I also strongly suspect BPH. He pees 4-5 times a night. He would like to address his sleep apnea first before considering medications for this.   Follow-up with me if continued symptoms 1 month after starting cpap  Orders Placed This Encounter  Procedures  . CBC    [place]  .  Comprehensive metabolic panel    [place]  . TSH    [place]  . Ambulatory referral to Sleep Studies    Referral Priority:  Routine    Referral Type:  Consultation    Referral Reason:  Specialty Services Required    Number of Visits Requested:  1  . Amb ref to Medical Nutrition Therapy-MNT    Referral Priority:  Routine    Referral Type:  Consultation    Referral Reason:  Specialty Services Required    Requested Specialty:  Nutrition    Number  of Visits Requested:  1

## 2015-01-12 NOTE — Assessment & Plan Note (Signed)
S: Exercise is limited. Eating habits are poor. Wife is concerned about education on nutrition topics A/P: We will refer to nutrition for further counseling.

## 2015-01-12 NOTE — Patient Instructions (Signed)
Medication Instructions:  No changes  Other Instructions:  We will call you within a week about your referral to sleep medicine (if they call you for sleep study ask them to see if prior sleep study still valid and if you can just see MD to discuss helping with tolerance/fitting/masks). If you do not hear within 2 weeks, give [place] a call.   Labwork: Before you leave  Testing/Procedures/Immunizations: Advise a flu shot before end of November  Follow-Up (all visit scheduling, rescheduling, cancellations including labs should be scheduled at front desk): If not making progress within a month after tolerating cpap

## 2015-01-27 ENCOUNTER — Other Ambulatory Visit (INDEPENDENT_AMBULATORY_CARE_PROVIDER_SITE_OTHER): Payer: BLUE CROSS/BLUE SHIELD

## 2015-01-27 ENCOUNTER — Ambulatory Visit (INDEPENDENT_AMBULATORY_CARE_PROVIDER_SITE_OTHER): Payer: BLUE CROSS/BLUE SHIELD | Admitting: Gastroenterology

## 2015-01-27 ENCOUNTER — Encounter: Payer: Self-pay | Admitting: Gastroenterology

## 2015-01-27 VITALS — BP 130/80 | HR 82 | Ht 73.0 in | Wt 293.0 lb

## 2015-01-27 DIAGNOSIS — R109 Unspecified abdominal pain: Secondary | ICD-10-CM | POA: Diagnosis not present

## 2015-01-27 DIAGNOSIS — R197 Diarrhea, unspecified: Secondary | ICD-10-CM | POA: Diagnosis not present

## 2015-01-27 LAB — SEDIMENTATION RATE: SED RATE: 13 mm/h (ref 0–22)

## 2015-01-27 LAB — HIGH SENSITIVITY CRP: CRP, High Sensitivity: 6.61 mg/L — ABNORMAL HIGH (ref 0.000–5.000)

## 2015-01-27 MED ORDER — NA SULFATE-K SULFATE-MG SULF 17.5-3.13-1.6 GM/177ML PO SOLN: 1.0000 | Freq: Once | ORAL | Status: DC

## 2015-01-27 NOTE — Progress Notes (Signed)
      History of Present Illness:  Ross [name] as returned because of diarrhea.  Approximately a year ago he was seen for excess gas.  Over the past 6 months he has developed frequent loose stools accompanied by urgency and occasional incontinence.  He has frequent lower abdominal discomfort.  There's been no change in medications or diet.  CT scan in July was unremarkable.  He may frequently have at least 3-4 bowel movements in a day.  Colonoscopy in 2013 was only remarkable for internal hemorrhoids.  He is occasional rectal discomfort with burning and spots of blood on the toilet tissue.    Review of Systems: Pertinent positive and negative review of systems were noted in the above HPI section. All other review of systems were otherwise negative.    Current Medications, Allergies, Past Medical History, Past Surgical History, Family History and Social History were reviewed in [employer] electronic medical record  Vital signs were reviewed in today's medical record. Physical Exam: General: Well developed , well nourished, no acute distress Skin: anicteric Head: Normocephalic and atraumatic Eyes:  sclerae anicteric, EOMI Ears: Normal auditory acuity Mouth: No deformity or lesions Lymph Nodes: no lymphadenopathy Lungs: Clear throughout to auscultation Heart: Regular rate and rhythm; no murmurs, rubs or brui: Gastroinestinal:  Soft, non tender and non distended. No masses, hepatosplenomegaly or hernias noted. Normal Bowel sounds Rectal:deferred Musculoskeletal: Symmetrical with no gross deformities  Pulses:  Normal pulses noted Extremities: No clubbing, cyanosis, edema or deformities noted Neurological: Alert oriented x 4, grossly nonfocal Psychological:  Alert and cooperative. Normal mood and affect  See Assessment and Plan under Problem List

## 2015-01-27 NOTE — Assessment & Plan Note (Signed)
Six-month history of progressive diarrhea with urgency abdominal pain.  This is in the absence of medication or dietary changes.  Colitis must be considered.  Doubt malabsorption.  Parasitic infection, IBS are less likely considerations.  Recommendations #1 stool lactoferrin, O&P and stool pathogen panel #2 colonoscopy with random biopsies in the absence of specific changes to rule out microscopic colitis, pending results of #1

## 2015-01-27 NOTE — Patient Instructions (Signed)
Go to the basement for labs and stool studies today  You have been scheduled for a colonoscopy. Please follow written instructions given to you at your visit today.  Please pick up your prep supplies at the pharmacy within the next 1-3 days. If you use inhalers (even only as needed), please bring them with you on the day of your procedure. Your physician has requested that you go to www.startemmi.com and enter the access code given to you at your visit today. This web site gives a general overview about your procedure. However, you should still follow specific instructions given to you by our office regarding your preparation for the procedure.

## 2015-01-28 ENCOUNTER — Other Ambulatory Visit: Payer: BLUE CROSS/BLUE SHIELD

## 2015-01-28 ENCOUNTER — Other Ambulatory Visit: Payer: Self-pay | Admitting: *Deleted

## 2015-01-28 DIAGNOSIS — R197 Diarrhea, unspecified: Secondary | ICD-10-CM

## 2015-01-28 DIAGNOSIS — R109 Unspecified abdominal pain: Secondary | ICD-10-CM

## 2015-01-29 LAB — GASTROINTESTINAL PATHOGEN PANEL PCR
C. DIFFICILE TOX A/B, PCR: NEGATIVE
CAMPYLOBACTER, PCR: NEGATIVE
Cryptosporidium, PCR: NEGATIVE
E COLI (STEC) STX1/STX2, PCR: NEGATIVE
E COLI 0157, PCR: NEGATIVE
E coli (ETEC) LT/ST PCR: NEGATIVE
Giardia lamblia, PCR: NEGATIVE
Norovirus, PCR: NEGATIVE
Rotavirus A, PCR: NEGATIVE
SALMONELLA, PCR: NEGATIVE
SHIGELLA, PCR: NEGATIVE

## 2015-01-29 LAB — OVA AND PARASITE EXAMINATION: OP: NONE SEEN

## 2015-01-29 LAB — FECAL LACTOFERRIN, QUANT: Lactoferrin: POSITIVE

## 2015-02-04 ENCOUNTER — Telehealth: Payer: Self-pay | Admitting: Gastroenterology

## 2015-02-04 NOTE — Telephone Encounter (Signed)
Spoke with the patient. He is very uncomfortable. He cannot pass gas with confidence. He is having a hard time staying at his work station because he has such stool urgency. No earlier openings yet. He is aware. Will watch for an opening. His cell number is [phone] and his wife's cell number is [phone].

## 2015-02-11 NOTE — Telephone Encounter (Signed)
Offered an appointment for 02/23/15 at 4pm. He will call back to me on 02/13/15 to let me know if he can do this.

## 2015-02-12 NOTE — Telephone Encounter (Signed)
Pt said he is returning your call 

## 2015-02-13 NOTE — Telephone Encounter (Signed)
Confirmed his instructions for his colonoscopy.

## 2015-02-23 ENCOUNTER — Encounter: Payer: BLUE CROSS/BLUE SHIELD | Admitting: Gastroenterology

## 2015-03-02 ENCOUNTER — Encounter: Payer: BLUE CROSS/BLUE SHIELD | Admitting: Gastroenterology

## 2015-03-03 ENCOUNTER — Institutional Professional Consult (permissible substitution): Payer: BLUE CROSS/BLUE SHIELD | Admitting: Pulmonary Disease

## 2015-03-10 ENCOUNTER — Encounter: Payer: BLUE CROSS/BLUE SHIELD | Admitting: Gastroenterology

## 2015-03-11 ENCOUNTER — Encounter: Payer: BLUE CROSS/BLUE SHIELD | Admitting: Gastroenterology

## 2015-05-04 ENCOUNTER — Other Ambulatory Visit: Payer: Self-pay

## 2015-05-04 ENCOUNTER — Ambulatory Visit (AMBULATORY_SURGERY_CENTER): Payer: BLUE CROSS/BLUE SHIELD | Admitting: Gastroenterology

## 2015-05-04 ENCOUNTER — Encounter: Payer: Self-pay | Admitting: Gastroenterology

## 2015-05-04 VITALS — BP 107/63 | HR 69 | Temp 97.7°F | Resp 22 | Ht 73.0 in | Wt 293.0 lb

## 2015-05-04 DIAGNOSIS — R197 Diarrhea, unspecified: Secondary | ICD-10-CM | POA: Diagnosis present

## 2015-05-04 DIAGNOSIS — R1030 Lower abdominal pain, unspecified: Secondary | ICD-10-CM

## 2015-05-04 MED ORDER — GLYCOPYRROLATE 2 MG PO TABS: 2.0000 mg | ORAL_TABLET | Freq: Two times a day (BID) | ORAL | Status: DC

## 2015-05-04 MED ORDER — SODIUM CHLORIDE 0.9 % IV SOLN: 500.0000 mL | INTRAVENOUS | Status: DC

## 2015-05-04 NOTE — Progress Notes (Signed)
Called to room to assist during endoscopic procedure.  Patient ID and intended procedure confirmed with present staff. Received instructions for my participation in the procedure from the performing physician.  

## 2015-05-04 NOTE — Patient Instructions (Signed)
YOU HAD AN ENDOSCOPIC PROCEDURE TODAY AT THE [facility]:   Refer to the procedure report that was given to you for any specific questions about what was found during the examination.  If the procedure report does not answer your questions, please call your gastroenterologist to clarify.  If you requested that your care partner not be given the details of your procedure findings, then the procedure report has been included in a sealed envelope for you to review at your convenience later.  YOU SHOULD EXPECT: Some feelings of bloating in the abdomen. Passage of more gas than usual.  Walking can help get rid of the air that was put into your GI tract during the procedure and reduce the bloating. If you had a lower endoscopy (such as a colonoscopy or flexible sigmoidoscopy) you may notice spotting of blood in your stool or on the toilet paper. If you underwent a bowel prep for your procedure, you may not have a normal bowel movement for a few days.  Please Note:  You might notice some irritation and congestion in your nose or some drainage.  This is from the oxygen used during your procedure.  There is no need for concern and it should clear up in a day or so.  SYMPTOMS TO REPORT IMMEDIATELY:   Following lower endoscopy (colonoscopy or flexible sigmoidoscopy):  Excessive amounts of blood in the stool  Significant tenderness or worsening of abdominal pains  Swelling of the abdomen that is new, acute  Fever of 100F or higher   For urgent or emergent issues, a gastroenterologist can be reached at any hour by calling [phone].   DIET: Your first meal following the procedure should be a small meal and then it is ok to progress to your normal diet. Heavy or fried foods are harder to digest and may make you feel nauseous or bloated.  Likewise, meals heavy in dairy and vegetables can increase bloating.  Drink plenty of fluids but you should avoid alcoholic beverages for 24 hours.  Try to  eliminate dairy products for 10 days to see if it helps the diarrhea.  ACTIVITY:  You should plan to take it easy for the rest of today and you should NOT DRIVE or use heavy machinery until tomorrow (because of the sedation medicines used during the test).    FOLLOW UP: Our staff will call the number listed on your records the next business day following your procedure to check on you and address any questions or concerns that you may have regarding the information given to you following your procedure. If we do not reach you, we will leave a message.  However, if you are feeling well and you are not experiencing any problems, there is no need to return our call.  We will assume that you have returned to your regular daily activities without incident.  If any biopsies were taken you will be contacted by phone or by letter within the next 1-3 weeks.  Please call us at [phone] if you have not heard about the biopsies in 3 weeks.    SIGNATURES/CONFIDENTIALITY: You and/or your care partner have signed paperwork which will be entered into your electronic medical record.  These signatures attest to the fact that that the information above on your After Visit Summary has been reviewed and is understood.  Full responsibility of the confidentiality of this discharge information lies with you and/or your care-partner.  Take your new medication ads prescribed.  Thank-you  for choosing us for your medical needs.

## 2015-05-04 NOTE — Op Note (Signed)
[facility] [address] [place], [id number]   COLONOSCOPY PROCEDURE REPORT  PATIENT: [name], Ross J  MR#: [id number] BIRTHDATE: 27-Aug-1955 , 59  yrs. old GENDER: male ENDOSCOPIST: Meryl DareMalcolm T Diogenes Whirley, MD, Clement[facility] GrahamFACG REFERRED BY:  Shelva MajesticStephen O Hunter, M.D. PROCEDURE DATE:  05/04/2015 PROCEDURE:   Colonoscopy, diagnostic and Colonoscopy with biopsy First Screening Colonoscopy - Avg.  risk and is 50 yrs.  old or older - No.  Prior Negative Screening - Now for repeat screening. N/A  History of Adenoma - Now for follow-up colonoscopy & has been > or = to 3 yrs.  N/A  Polyps removed today? No Recommend repeat exam, <10 yrs? No ASA CLASS:   Class II INDICATIONS:Clinically significant diarrhea of unexplained origin.  MEDICATIONS: Monitored anesthesia care and Propofol 250 mg IV DESCRIPTION OF PROCEDURE:   After the risks benefits and alternatives of the procedure were thoroughly explained, informed consent was obtained.  The digital rectal exam revealed no abnormalities of the rectum.   The LB [id number]-H190 [id number]  endoscope was introduced through the anus and advanced to the terminal ileum which was intubated for a short distance. No adverse events experienced.   The quality of the prep was excellent.  (Suprep was used)  The instrument was then slowly withdrawn as the colon was fully examined. Estimated blood loss is zero unless otherwise noted in this procedure report.    COLON FINDINGS: A normal appearing cecum, ileocecal valve, and appendiceal orifice were identified.  The ascending, transverse, descending, sigmoid colon, and rectum appeared unremarkable. Multiple random biopsies were performed.   The examined terminal ileum appeared to be normal.  Retroflexed views revealed internal Grade I hemorrhoids. The time to cecum = 1.1 Withdrawal time = 10.7 The scope was withdrawn and the procedure completed. COMPLICATIONS: There were no immediate complications.  ENDOSCOPIC  IMPRESSION: 1.   Normal colonoscopy; multiple random biopsies performed 2.   The examined terminal ileum appeared to be normal  RECOMMENDATIONS: 1.  Await pathology results 2.  Lactose free, caffiene free diet for 10 days 3.  Robinul 2 mg po bid, 1 year of refills 4.  Office appt in 1 month 5.  Continue current colorectal screening recommendations for "routine risk" patients with a repeat colonoscopy in 10 years.  eSigned:  Meryl DareMalcolm T Faydra Korman, MD, [facility] 05/04/2015 1:53 PM

## 2015-05-04 NOTE — Progress Notes (Signed)
Report to PACU, RN, vss, BBS= Clear.  

## 2015-05-05 ENCOUNTER — Telehealth: Payer: Self-pay | Admitting: *Deleted

## 2015-05-05 NOTE — Telephone Encounter (Signed)
  Follow up Call-  Call back number 05/04/2015  Post procedure Call Back phone  # [phone]  Permission to leave phone message Yes     Patient questions:  Do you have a fever, pain , or abdominal swelling? No. Pain Score  0 *  Have you tolerated food without any problems? Yes.    Have you been able to return to your normal activities? Yes.    Do you have any questions about your discharge instructions: Diet   No. Medications  No. Follow up visit  No.  Do you have questions or concerns about your Care? No.  Actions: * If pain score is 4 or above: No action needed, pain <4.

## 2015-05-18 ENCOUNTER — Encounter: Payer: Self-pay | Admitting: Gastroenterology

## 2015-05-27 ENCOUNTER — Ambulatory Visit (INDEPENDENT_AMBULATORY_CARE_PROVIDER_SITE_OTHER): Payer: BLUE CROSS/BLUE SHIELD | Admitting: Pulmonary Disease

## 2015-05-27 ENCOUNTER — Encounter: Payer: Self-pay | Admitting: Pulmonary Disease

## 2015-05-27 VITALS — BP 116/78 | HR 88 | Ht 73.0 in | Wt 303.2 lb

## 2015-05-27 DIAGNOSIS — G4733 Obstructive sleep apnea (adult) (pediatric): Secondary | ICD-10-CM | POA: Diagnosis not present

## 2015-05-27 NOTE — Patient Instructions (Signed)
You have moderate obstructive sleep apnea - this may be causing your nocturnal urination Home sleep study

## 2015-05-27 NOTE — Progress Notes (Signed)
Subjective:    Patient ID: Ross [name], male    DOB: 1955-07-02, 60 y.o.   MRN: [id number]  HPI  Chief Complaint  Patient presents with  . Sleep Consult    Referred by Dr. Durene CalHunter; Pt had PSG done 08/24/09; pt uses Dental appliance for OSA now.  Epworth Score: 588   60 year old obese man referred for evaluation of sleep-disordered breathing. His main complaint is nocturia. Loud snoring has been noted by family members Epworth sleepiness score is 8 Bedtime is around 8 PM. Sleep latency is minimal, he sleeps on his side with one pillow reports 3-5 nocturnal awakenings including nocturia and is out of bed at 4 AM on workdays, on his days off he stays in bed until 8 AM, wakes up with some dryness of mouth but denies headaches. He is gained about 40 pounds in the last 2 years There is no history suggestive of cataplexy, sleep paralysis or parasomnias  PSG/2011 showed total sleep time 255 minutes, AHI 19/hour with lowest desaturation of 87% and few PLM's noted. He did not like CPAP mask and was referred to a dentist but did not like the oral appliance either. He is more amenable for treatment this time   Past Medical History  Diagnosis Date  . Family history of malignant neoplasm of gastrointestinal tract     mother  . Sleep apnea   . Gout   . Hypertension   . DDD (degenerative disc disease)   . GERD (gastroesophageal reflux disease)   . Rectal bleeding   . Hemorrhoids   . Fatty liver   . Anal fissure   . Arthritis   . Glaucoma   . Stroke [facility])     in right eye   Past Surgical History  Procedure Laterality Date  . Tonsillectomy    . Lumbar disc surgery      L5  . Pyloroplasty      pyloric stenosis, as infant  . Eye surgery Right     laser   . Glaucoma surgery Bilateral     No Known Allergies  Social History   Social History  . Marital Status: Married    Spouse Name: Isabelle CourseLydia  . Number of Children: 3  . Years of Education: N/A   Occupational History  . Clinical cytogeneticistMachine  Operator   .     Social History Main Topics  . Smoking status: Former Smoker -- 1.00 packs/day for 20 years    Types: Cigarettes    Quit date: 05/17/1991  . Smokeless tobacco: Never Used  . Alcohol Use: 1.0 oz/week    2 drink(s) per week     Comment: Social use   . Drug Use: No  . Sexual Activity: Yes   Other Topics Concern  . Not on file   Social History Narrative   ** Merged History Encounter **       3 caffeine drinks daily      Family History  Problem Relation Age of Onset  . Colon cancer Mother   . Stomach cancer Paternal Grandmother   . Prostate cancer Father   . Diabetes Maternal Grandmother   . Heart disease Maternal Grandmother   . Breast cancer Mother   . Testicular cancer Father   . Heart disease Maternal Grandfather      Review of Systems  Constitutional: Negative for fever, chills, activity change, appetite change and unexpected weight change.  HENT: Negative for congestion, dental problem, postnasal drip, rhinorrhea, sneezing, sore throat, trouble swallowing  and voice change.   Eyes: Negative for visual disturbance.  Respiratory: Negative for cough, choking and shortness of breath.   Cardiovascular: Negative for chest pain and leg swelling.  Gastrointestinal: Negative for nausea, vomiting and abdominal pain.  Genitourinary: Negative for difficulty urinating.  Musculoskeletal: Negative for arthralgias.  Skin: Negative for rash.  Psychiatric/Behavioral: Negative for behavioral problems and confusion.       Objective:   Physical Exam  Gen. Pleasant, obese, in no distress, normal affect ENT - no lesions, no post nasal drip, class 2-3 airway Neck: No JVD, no thyromegaly, no carotid bruits Lungs: no use of accessory muscles, no dullness to percussion, decreased without rales or rhonchi  Cardiovascular: Rhythm regular, heart sounds  normal, no murmurs or gallops, no peripheral edema Abdomen: soft and non-tender, no hepatosplenomegaly, BS  normal. Musculoskeletal: No deformities, no cyanosis or clubbing Neuro:  alert, non focal, no tremors       Assessment & Plan:

## 2015-05-27 NOTE — Assessment & Plan Note (Signed)
moderate obstructive sleep apnea - this may be causing your nocturnal urination Home sleep study  Given excessive daytime somnolence, narrow pharyngeal exam, witnessed apneas & loud snoring, obstructive sleep apnea is very likely & an overnight polysomnogram will be scheduled as a home study. The pathophysiology of obstructive sleep apnea , it's cardiovascular consequences & modes of treatment including CPAP were discused with the patient in detail & they evidenced understanding.

## 2015-05-28 ENCOUNTER — Telehealth: Payer: Self-pay | Admitting: Pulmonary Disease

## 2015-05-29 NOTE — Telephone Encounter (Signed)
I have returned pt's call & left him a vm to call me back.  Need to set up time for him to pick up sleep machine.

## 2015-05-29 NOTE — Telephone Encounter (Signed)
Pt returned my call.  He will come by 1/19 at 10:00 to pick up machine.  Nothing further needed.

## 2015-06-04 DIAGNOSIS — G4733 Obstructive sleep apnea (adult) (pediatric): Secondary | ICD-10-CM | POA: Diagnosis not present

## 2015-06-09 DIAGNOSIS — G4733 Obstructive sleep apnea (adult) (pediatric): Secondary | ICD-10-CM | POA: Diagnosis not present

## 2015-06-10 ENCOUNTER — Telehealth: Payer: Self-pay | Admitting: Pulmonary Disease

## 2015-06-10 DIAGNOSIS — G4733 Obstructive sleep apnea (adult) (pediatric): Secondary | ICD-10-CM

## 2015-06-10 NOTE — Telephone Encounter (Signed)
Sleep Study 06/04/2015  Per RA:  Patient needs CPAP Titration Study AHI: 42.4/hr Lowest SAT 61%

## 2015-06-11 ENCOUNTER — Encounter: Payer: Self-pay | Admitting: Pulmonary Disease

## 2015-06-11 NOTE — Telephone Encounter (Signed)
LMTCB x1 w/ spouse 

## 2015-06-12 ENCOUNTER — Other Ambulatory Visit: Payer: Self-pay | Admitting: *Deleted

## 2015-06-12 DIAGNOSIS — G4733 Obstructive sleep apnea (adult) (pediatric): Secondary | ICD-10-CM

## 2015-06-12 NOTE — Telephone Encounter (Signed)
[phone] or [phone], pt cb

## 2015-06-12 NOTE — Telephone Encounter (Signed)
Patient notified of results and ok with doing CPAP titration study. Order entered for CPAP titration study. Nothing further needed.

## 2015-06-15 ENCOUNTER — Ambulatory Visit: Payer: BLUE CROSS/BLUE SHIELD | Admitting: Gastroenterology

## 2015-06-17 ENCOUNTER — Encounter (HOSPITAL_BASED_OUTPATIENT_CLINIC_OR_DEPARTMENT_OTHER): Payer: BLUE CROSS/BLUE SHIELD

## 2015-07-15 ENCOUNTER — Other Ambulatory Visit: Payer: BLUE CROSS/BLUE SHIELD

## 2015-07-22 ENCOUNTER — Encounter: Payer: BLUE CROSS/BLUE SHIELD | Admitting: Family Medicine

## 2015-07-28 ENCOUNTER — Ambulatory Visit (INDEPENDENT_AMBULATORY_CARE_PROVIDER_SITE_OTHER): Payer: BLUE CROSS/BLUE SHIELD | Admitting: Physician Assistant

## 2015-07-28 VITALS — BP 148/98 | HR 77 | Temp 98.4°F | Resp 18 | Ht 73.0 in | Wt 299.8 lb

## 2015-07-28 DIAGNOSIS — K529 Noninfective gastroenteritis and colitis, unspecified: Secondary | ICD-10-CM | POA: Diagnosis not present

## 2015-07-28 DIAGNOSIS — R197 Diarrhea, unspecified: Secondary | ICD-10-CM | POA: Diagnosis not present

## 2015-07-28 NOTE — Progress Notes (Signed)
   Subjective:    Patient ID: Ross [name], male    DOB: 09/05/1955, 60 y.o.   MRN: [id number]  Chief Complaint  Patient presents with  . Abdominal Pain  . Diarrhea    x last night   Medications, allergies, past medical history, surgical history, family history, social history and problem list reviewed and updated.  HPI  [age] yom presents with above complaints.   Symptoms started last night with diarrhea. Has had 3 episodes. Non bloody. Feeling quaesy. Denies emesis. Denies abd pain (despite listed above), fevers, chills. Feels "uncomfortable." No dysuria.   States he is not allowed to go to work if he feels ill. 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   Review of Systems See HPI.     Objective:   Physical Exam  Constitutional: He is oriented to person, place, and time. He appears well-developed and well-nourished.  Non-toxic appearance. He does not have a sickly appearance. He does not appear ill. No distress.  BP 148/98 mmHg  Pulse 77  Temp(Src) 98.4 F (36.9 C) (Oral)  Resp 18  Ht 6\' 1"  (1.854 m)  Wt 299 lb 12.8 oz (135.988 kg)  BMI 39.56 kg/m2  SpO2 97%   Abdominal: Soft. Normal appearance and bowel sounds are normal. There is tenderness in the suprapubic area. There is no rigidity, no rebound, no guarding, no tenderness at McBurney's point and negative Murphy's sign.  Mild suprapubic ttp.   Neurological: He is alert and oriented to person, place, and time.  Psychiatric: He has a normal mood and affect. His speech is normal and behavior is normal.      Assessment & Plan:   Diarrhea, unspecified type  Gastroenteritis --suspect gastroenteritis, most likely viral, as pt afebrile, no abd pain and only mild ttp, symptoms just started last night --note for out of work today and tomorrow --bland diet, rest, fluids --rtc with fevers, chills, brbpr  Donnajean Lopesodd M. Lexey Fletes, PA-C Physician Assistant-Certified [facility]  07/28/2015  10:44 AM

## 2015-07-28 NOTE — Patient Instructions (Signed)

## 2015-07-30 ENCOUNTER — Other Ambulatory Visit: Payer: Self-pay | Admitting: Family Medicine

## 2015-07-30 ENCOUNTER — Other Ambulatory Visit (INDEPENDENT_AMBULATORY_CARE_PROVIDER_SITE_OTHER): Payer: BLUE CROSS/BLUE SHIELD

## 2015-07-30 DIAGNOSIS — Z0000 Encounter for general adult medical examination without abnormal findings: Secondary | ICD-10-CM

## 2015-07-30 LAB — POC URINALSYSI DIPSTICK (AUTOMATED)
Bilirubin, UA: NEGATIVE
GLUCOSE UA: NEGATIVE
Ketones, UA: NEGATIVE
LEUKOCYTES UA: NEGATIVE
NITRITE UA: NEGATIVE
Protein, UA: NEGATIVE
Spec Grav, UA: >=1.03
UROBILINOGEN UA: 0.2
pH, UA: 5.5

## 2015-07-30 LAB — CBC WITH DIFFERENTIAL/PLATELET
BASOS ABS: 0 10*3/uL (ref 0.0–0.1)
Basophils Relative: 0.4 % (ref 0.0–3.0)
Eosinophils Absolute: 0.3 10*3/uL (ref 0.0–0.7)
Eosinophils Relative: 4.6 % (ref 0.0–5.0)
HCT: 40.2 % (ref 39.0–52.0)
Hemoglobin: 13.8 g/dL (ref 13.0–17.0)
LYMPHS ABS: 2.3 10*3/uL (ref 0.7–4.0)
Lymphocytes Relative: 35.2 % (ref 12.0–46.0)
MCHC: 34.2 g/dL (ref 30.0–36.0)
MCV: 88.8 fl (ref 78.0–100.0)
MONOS PCT: 8.2 % (ref 3.0–12.0)
Monocytes Absolute: 0.5 10*3/uL (ref 0.1–1.0)
NEUTROS PCT: 51.6 % (ref 43.0–77.0)
Neutro Abs: 3.4 10*3/uL (ref 1.4–7.7)
Platelets: 178 10*3/uL (ref 150.0–400.0)
RBC: 4.53 Mil/uL (ref 4.22–5.81)
RDW: 12.9 % (ref 11.5–15.5)
WBC: 6.6 10*3/uL (ref 4.0–10.5)

## 2015-07-30 LAB — LIPID PANEL
CHOLESTEROL: 146 mg/dL (ref 0–200)
HDL: 40.7 mg/dL (ref >39.00)
LDL CALC: 87 mg/dL (ref 0–99)
NonHDL: 104.85
TRIGLYCERIDES: 91 mg/dL (ref 0.0–149.0)
Total CHOL/HDL Ratio: 4
VLDL: 18.2 mg/dL (ref 0.0–40.0)

## 2015-07-30 LAB — BASIC METABOLIC PANEL
BUN: 18 mg/dL (ref 6–23)
CALCIUM: 9.3 mg/dL (ref 8.4–10.5)
CO2: 27 mEq/L (ref 19–32)
Chloride: 106 mEq/L (ref 96–112)
Creatinine, Ser: 0.98 mg/dL (ref 0.40–1.50)
GFR: 82.53 mL/min (ref >60.00)
GLUCOSE: 122 mg/dL — AB (ref 70–99)
Potassium: 4 mEq/L (ref 3.5–5.1)
SODIUM: 141 meq/L (ref 135–145)

## 2015-07-30 LAB — HEPATIC FUNCTION PANEL
ALK PHOS: 56 U/L (ref 39–117)
ALT: 20 U/L (ref 0–53)
AST: 17 U/L (ref 0–37)
Albumin: 4.2 g/dL (ref 3.5–5.2)
BILIRUBIN TOTAL: 0.8 mg/dL (ref 0.2–1.2)
Bilirubin, Direct: 0.2 mg/dL (ref 0.0–0.3)
Total Protein: 6.7 g/dL (ref 6.0–8.3)

## 2015-07-30 LAB — PSA: PSA: 0.88 ng/mL (ref 0.10–4.00)

## 2015-07-30 LAB — TSH: TSH: 1.38 u[IU]/mL (ref 0.35–4.50)

## 2015-08-09 ENCOUNTER — Encounter (HOSPITAL_BASED_OUTPATIENT_CLINIC_OR_DEPARTMENT_OTHER): Payer: BLUE CROSS/BLUE SHIELD

## 2015-08-10 ENCOUNTER — Ambulatory Visit (INDEPENDENT_AMBULATORY_CARE_PROVIDER_SITE_OTHER): Payer: BLUE CROSS/BLUE SHIELD | Admitting: Family Medicine

## 2015-08-10 ENCOUNTER — Encounter: Payer: Self-pay | Admitting: Family Medicine

## 2015-08-10 VITALS — BP 110/80 | Temp 99.3°F | Ht 73.0 in | Wt 299.0 lb

## 2015-08-10 DIAGNOSIS — K219 Gastro-esophageal reflux disease without esophagitis: Secondary | ICD-10-CM

## 2015-08-10 DIAGNOSIS — R739 Hyperglycemia, unspecified: Secondary | ICD-10-CM | POA: Insufficient documentation

## 2015-08-10 DIAGNOSIS — R7309 Other abnormal glucose: Secondary | ICD-10-CM

## 2015-08-10 DIAGNOSIS — M1A00X0 Idiopathic chronic gout, unspecified site, without tophus (tophi): Secondary | ICD-10-CM | POA: Diagnosis not present

## 2015-08-10 DIAGNOSIS — I10 Essential (primary) hypertension: Secondary | ICD-10-CM

## 2015-08-10 DIAGNOSIS — N401 Enlarged prostate with lower urinary tract symptoms: Secondary | ICD-10-CM

## 2015-08-10 DIAGNOSIS — Z0000 Encounter for general adult medical examination without abnormal findings: Secondary | ICD-10-CM | POA: Diagnosis not present

## 2015-08-10 DIAGNOSIS — E669 Obesity, unspecified: Secondary | ICD-10-CM | POA: Diagnosis not present

## 2015-08-10 DIAGNOSIS — G4733 Obstructive sleep apnea (adult) (pediatric): Secondary | ICD-10-CM | POA: Diagnosis not present

## 2015-08-10 DIAGNOSIS — R351 Nocturia: Secondary | ICD-10-CM

## 2015-08-10 DIAGNOSIS — N529 Male erectile dysfunction, unspecified: Secondary | ICD-10-CM

## 2015-08-10 MED ORDER — OMEPRAZOLE 40 MG PO CPDR: DELAYED_RELEASE_CAPSULE | ORAL | Status: DC

## 2015-08-10 MED ORDER — SILDENAFIL CITRATE 100 MG PO TABS: 50.0000 mg | ORAL_TABLET | Freq: Every day | ORAL | Status: DC | PRN

## 2015-08-10 MED ORDER — LOSARTAN POTASSIUM-HCTZ 100-12.5 MG PO TABS: 1.0000 | ORAL_TABLET | Freq: Every day | ORAL | Status: DC

## 2015-08-10 MED ORDER — ALLOPURINOL 300 MG PO TABS: 300.0000 mg | ORAL_TABLET | Freq: Every day | ORAL | Status: DC

## 2015-08-10 NOTE — Patient Instructions (Addendum)
Work hard this year on diet exercise and weight loss......... avoid sugar...Marland Kitchen.Marland Kitchen.Marland Kitchen. walk 15 minutes daily  Continue current medications  Follow-up in one year sooner if any problems....... Kandee Keenory or Raynelle FanningJulie are 2 new adult nurse practitioner for Dr. [place]Jordan  As we discussed carbohydrate free diet walk 15 minutes daily........ increase to 30 minutes daily once she quit working.......Marland Kitchen. we will call you the report. If your blood sugars elevated or you need further evaluation and we will have you see Dr. Durene CalHunter in June. You have an appointment to get established with him on September 25 Fall

## 2015-08-10 NOTE — Progress Notes (Signed)
Subjective:    Patient ID: Ross [name], male    DOB: 1956-02-12, 60 y.o.   MRN: [id number]  HPI Ross [name] is a 60 year old married male nonsmoker who comes in today for general physical examination because of a history of gout, hypertension, chronic reflux esophagitis, and mild ED.  He takes allopurinol 300 mg daily because of a history of gout.  He takes Hyzaar 100-12 0.5 daily for hypertension BP 110/80  He's been seen in GI and has had upper endoscopy. He says he doesn't have a history of Barrett's esophagus over Dr. Russella DarStark asthma Prilosec 40 mg daily. I asked him to call Dr. Russella DarStark to see if he should continue that or not.  He uses Viagra 100 mg per ED.  He gets routine eye care, dental care, last colonoscopy 2016. He says they did some biopsies they were all negative advised to come back in 10 years  Vaccination history up-to-date  Social history........ he is married his wife has a history of cirrhosis and chronic liver disease. She also has severe degenerative joint disease. Last fall she had a fractured hip and was in rehabilitation for a long time. This is obviously a great stressor for him. She's been denied a liver transplant. His company has told him they were no longer got [employer] business and he is going to lose his job. He does have several spray and hopefully we'll get insurance    Review of Systems  Constitutional: Negative.   HENT: Negative.   Eyes: Negative.   Respiratory: Negative.   Cardiovascular: Negative.   Gastrointestinal: Negative.   Endocrine: Negative.   Genitourinary: Negative.   Musculoskeletal: Negative.   Skin: Negative.   Allergic/Immunologic: Negative.   Neurological: Negative.   Hematological: Negative.   Psychiatric/Behavioral: Negative.        Objective:   Physical Exam  Constitutional: He is oriented to person, place, and time. He appears well-developed and well-nourished.  HENT:  Head: Normocephalic and atraumatic.  Right Ear:  External ear normal.  Left Ear: External ear normal.  Nose: Nose normal.  Mouth/Throat: Oropharynx is clear and moist.  Eyes: Conjunctivae and EOM are normal. Pupils are equal, round, and reactive to light.  Neck: Normal range of motion. Neck supple. No JVD present. No tracheal deviation present. No thyromegaly present.  Cardiovascular: Normal rate, regular rhythm, normal heart sounds and intact distal pulses.  Exam reveals no gallop and no friction rub.   No murmur heard. No carotid nor aortic bruits peripheral pulses 2+ and symmetrical  Pulmonary/Chest: Effort normal and breath sounds normal. No stridor. No respiratory distress. He has no wheezes. He has no rales. He exhibits no tenderness.  Abdominal: Soft. Bowel sounds are normal. He exhibits no distension and no mass. There is no tenderness. There is no rebound and no guarding.  Genitourinary: Rectum normal, prostate normal and penis normal. Guaiac negative stool. No penile tenderness.  Musculoskeletal: Normal range of motion. He exhibits no edema or tenderness.  Lymphadenopathy:    He has no cervical adenopathy.  Neurological: He is alert and oriented to person, place, and time. He has normal reflexes. No cranial nerve deficit. He exhibits normal muscle tone.  Skin: Skin is warm and dry. No rash noted. No erythema. No pallor.  Total body skin exam normal scars 2. 1 abdomen from pyloric stenosis as an infant another and lumbar spine from lumbar disc surgery years ago  Psychiatric: He has a normal mood and affect. His behavior is normal. Judgment and  thought content normal.  Nursing note and vitals reviewed.         Assessment & Plan:  Hypertension at goal....... continue current therapy  History gout....... continue allopurinol  History of reflux esophagitis........ continue Prilosec at the direction of Dr. Russella Dar...Marland KitchenMarland KitchenMarland Kitchen might want to call them to see if he should continue that.  History of ED..... Refill  Viagra  Obesity......... again as we have previously we discussed diet exercise and weight loss  Elevated blood sugar.Marland Kitchen... 122......... again discussed diet exercise weight loss follow-up fasting blood sugar and A1c in 3 months

## 2015-08-10 NOTE — Progress Notes (Signed)
Pre visit review using our clinic review tool, if applicable. No additional management support is needed unless otherwise documented below in the visit note. 

## 2015-08-16 ENCOUNTER — Encounter (HOSPITAL_BASED_OUTPATIENT_CLINIC_OR_DEPARTMENT_OTHER): Payer: BLUE CROSS/BLUE SHIELD

## 2015-08-19 ENCOUNTER — Telehealth: Payer: Self-pay | Admitting: Pulmonary Disease

## 2015-08-19 NOTE — Telephone Encounter (Signed)
Ross [name] gets our precerts.  I will forward this message to her.

## 2015-08-19 NOTE — Telephone Encounter (Signed)
Ross [name] this did not need a precert when i checked Ross [name]

## 2015-08-19 NOTE — Telephone Encounter (Signed)
This was a coding error, sent a charge correction request to have the G code changed to [id number].  Called the patient, spoke with his wife let her know what was going on.

## 2015-08-19 NOTE — Telephone Encounter (Signed)
Called and spoke with pt. He states that he was scheduled for a home sleep study but his insurance has denied the sleep study. He states that he did the sleep study on 06/04/15 and he states that he discussed this with Cordelia PenSherry when he was set up with the sleep study. He states that he remembers Cordelia PenSherry getting pre approval for the sleep study and would like a message sent to her to clarify if a approval was made. I explained to him that I would send a message. He voiced understanding and had no further questions.   Sherry please advise

## 2015-08-25 ENCOUNTER — Ambulatory Visit (HOSPITAL_BASED_OUTPATIENT_CLINIC_OR_DEPARTMENT_OTHER): Payer: BLUE CROSS/BLUE SHIELD | Attending: Pulmonary Disease

## 2015-08-25 DIAGNOSIS — G4761 Periodic limb movement disorder: Secondary | ICD-10-CM | POA: Insufficient documentation

## 2015-08-25 DIAGNOSIS — G4733 Obstructive sleep apnea (adult) (pediatric): Secondary | ICD-10-CM | POA: Insufficient documentation

## 2015-08-25 DIAGNOSIS — R0683 Snoring: Secondary | ICD-10-CM | POA: Diagnosis not present

## 2015-08-25 DIAGNOSIS — G4730 Sleep apnea, unspecified: Secondary | ICD-10-CM | POA: Diagnosis present

## 2015-09-03 ENCOUNTER — Telehealth: Payer: Self-pay | Admitting: Pulmonary Disease

## 2015-09-03 DIAGNOSIS — G4733 Obstructive sleep apnea (adult) (pediatric): Secondary | ICD-10-CM

## 2015-09-03 DIAGNOSIS — G4730 Sleep apnea, unspecified: Secondary | ICD-10-CM | POA: Diagnosis not present

## 2015-09-03 NOTE — Telephone Encounter (Signed)
Send Rx for CPAP therapy on 13 cm H2O with a Medium size Fisher[employer]Paykel Full Face Mask Simplus mask and heated humidification. Download in 4 wks  OV in 6 wks

## 2015-09-03 NOTE — Progress Notes (Signed)
Patient Name: Ross [name], [name] Study Date: 08/25/2015 Gender: Male D.O.B: Jul 09, 1955 Age (years): [age] Referring Provider: Cyril Mourningakesh Alva MD, ABSM Height (inches): 72 Interpreting Physician: Cyril Mourningakesh Alva MD, ABSM Weight (lbs): 290 RPSGT: Melburn PopperWillard, Susan BMI: 39 MRN: [id number] Neck Size: 18.00   CLINICAL INFORMATION The patient is referred for a CPAP titration to treat sleep apnea. Date of  HST:  06/04/2015  AHI 58.6.   SLEEP STUDY TECHNIQUE As per the AASM Manual for the Scoring of Sleep and Associated Events v2.3 (April 2016) with a hypopnea requiring 4% desaturations. The channels recorded and monitored were frontal, central and occipital EEG, electrooculogram (EOG), submentalis EMG (chin), nasal and oral airflow, thoracic and abdominal wall motion, anterior tibialis EMG, snore microphone, electrocardiogram, and pulse oximetry. Continuous positive airway pressure (CPAP) was initiated at the beginning of the study and titrated to treat sleep-disordered breathing.   RESPIRATORY PARAMETERS Optimal PAP Pressure (cm): 13 AHI at Optimal Pressure (/hr): 3.4 Overall Minimal O2 (%): 84.00 Supine % at Optimal Pressure (%): 74 Minimal O2 at Optimal Pressure (%): 90.0     SLEEP ARCHITECTURE The study was initiated at 10:30:13 PM and ended at 4:40:39 AM. Sleep onset time was 30.2 minutes and the sleep efficiency was 67.0%. The total sleep time was 248.3 minutes. The patient spent 6.24% of the night in stage N1 sleep, 64.76% in stage N2 sleep, 3.83% in stage N3 and 25.17% in REM.Stage REM latency was 105.0 minutes Wake after sleep onset was 92.0. Alpha intrusion was absent. Supine sleep was 44.54%.   CARDIAC DATA The 2 lead EKG demonstrated sinus rhythm. The mean heart rate was 58.58 beats per minute. Other EKG findings include: None.   LEG MOVEMENT DATA The total Periodic Limb Movements of Sleep (PLMS) were 417. The PLMS index was 100.78. A PLMS index of <15 is considered normal in  adults.   IMPRESSIONS - The optimal PAP pressure was 13 cm of water. - Central sleep apnea was not noted during this titration (CAI = 0.2/h). - Moderate oxygen desaturations were observed during this titration (min O2 = 84.00%). - The patient snored with Moderate snoring volume during this titration study. - No cardiac abnormalities were observed during this study. - Severe periodic limb movements were observed during this study. Arousals associated with PLMs were significant.   DIAGNOSIS - Obstructive Sleep Apnea (327.23 [G47.33 ICD-10]) - PLM disorder   RECOMMENDATIONS - Trial of CPAP therapy on 13 cm H2O with a Medium size Fisher&Paykel Full Face Mask Simplus mask and heated humidification. - Avoid alcohol, sedatives and other CNS depressants that may worsen sleep apnea and disrupt normal sleep architecture. - Sleep hygiene should be reviewed to assess factors that may improve sleep quality. - Weight management and regular exercise should be initiated or continued. - Return to [facility] for re-evaluation after 4 weeks of therapy - Please corelate clinically for restless legs syndrome  Cyril Mourningakesh Alva MD. FCCP. [facility]   09/03/2015

## 2015-09-09 NOTE — Telephone Encounter (Signed)
Order entered for CPAP machine. Attempted to contact patient, left message for patient to call back.

## 2015-09-10 NOTE — Telephone Encounter (Signed)
Left message for patient to call back  

## 2015-09-16 NOTE — Telephone Encounter (Signed)
Patient notified that CPAP has been ordered. Patient scheduled for 6 weeks follow up with TP. Nothing further needed.

## 2015-10-08 ENCOUNTER — Ambulatory Visit (INDEPENDENT_AMBULATORY_CARE_PROVIDER_SITE_OTHER): Payer: BLUE CROSS/BLUE SHIELD | Admitting: Family Medicine

## 2015-10-08 VITALS — BP 124/78 | HR 87 | Temp 98.1°F | Resp 16 | Ht 73.0 in | Wt 292.0 lb

## 2015-10-08 DIAGNOSIS — M10072 Idiopathic gout, left ankle and foot: Secondary | ICD-10-CM

## 2015-10-08 DIAGNOSIS — E669 Obesity, unspecified: Secondary | ICD-10-CM

## 2015-10-08 MED ORDER — PREDNISONE 20 MG PO TABS: ORAL_TABLET | ORAL | Status: DC

## 2015-10-08 NOTE — Progress Notes (Signed)
Subjective:    Patient ID: Ross [name], male    DOB: 1955-09-08, 60 y.o.   MRN: [id number]  HPI This is a 60 yo male who presents today with 4-5 days of foot pain. He has been under a great deal of stress. 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 He has not been eating as well he usually does. He has a history of gout in different joints. Has been on allopurinol in the past, but since his gout episodes are so spread out he has stopped his allopurinol. He has been taking Advil 2 tablets every 4 hours without relief.   Sees Dr. Tawanna Coolerodd. Has been trying to cut out sugars in his diet. Had elevated glucose and has follow up appointment scheduled.   Past Medical History  Diagnosis Date  . Family history of malignant neoplasm of gastrointestinal tract     mother  . Sleep apnea   . Gout   . Hypertension   . DDD (degenerative disc disease)   . GERD (gastroesophageal reflux disease)   . Rectal bleeding   . Hemorrhoids   . Fatty liver   . Anal fissure   . Arthritis   . Glaucoma   . Stroke [facility](HCC)     in right eye   Past Surgical History  Procedure Laterality Date  . Tonsillectomy    . Lumbar disc surgery      L5  . Pyloroplasty      pyloric stenosis, as infant  . Eye surgery Right     laser   . Glaucoma surgery Bilateral    Family History  Problem Relation Age of Onset  . Colon cancer Mother   . Stomach cancer Paternal Grandmother   . Prostate cancer Father   . Diabetes Maternal Grandmother   . Heart disease Maternal Grandmother   . Breast cancer Mother   . Testicular cancer Father   . Heart disease Maternal Grandfather    Social History  Substance Use Topics  . Smoking status: Former Smoker -- 1.00 packs/day for 20 years    Types: Cigarettes    Quit date: 05/17/1991  . Smokeless tobacco: Never Used  . Alcohol Use: 1.0 oz/week    2 drink(s) per week     Comment: Social use       Review of Systems No fever or chills, no chest pain, no SOB,  + pain, + swelling    Objective:   Physical Exam  Constitutional: He is oriented to person, place, and time. He appears well-developed and well-nourished.  Obese.   HENT:  Head: Normocephalic and atraumatic.  Eyes: Conjunctivae are normal.  Cardiovascular: Normal rate.   Pulmonary/Chest: Effort normal.  Musculoskeletal: He exhibits edema and tenderness.  Left 1st MTP joint erythematous and tender. Good circulation and pulse.   Neurological: He is alert and oriented to person, place, and time.  Skin: Skin is warm and dry.  Psychiatric: He has a normal mood and affect. His behavior is normal. Thought content normal.  Vitals reviewed.     BP 124/78 mmHg  Pulse 87  Temp(Src) 98.1 F (36.7 C) (Oral)  Resp 16  Ht 6\' 1"  (1.854 m)  Wt 292 lb (132.45 kg)  BMI 38.53 kg/m2  SpO2 96% Wt Readings from Last 3 Encounters:  10/08/15 292 lb (132.45 kg)  08/25/15 290 lb (131.543 kg)  08/10/15 299 lb (135.626 kg)       Assessment & Plan:  1. Acute idiopathic  gout of left foot - predniSONE (DELTASONE) 20 MG tablet; Take 3 tablets for 3 days, then 2 for 3 days then 1 for 3 days  Dispense: 18 tablet; Refill: 0 - Care order/instruction - Provided written and verbal information regarding diagnosis and treatment. - RTC if no improvement in 48 hours, sooner if he develops worsening redness, pain, swelling or streaking, fever or chills  2. Obesity - Encouraged him to decrease carbohydrate intake, increase vegetables and protein  - follow up with PCP as scheduled   Olean Ree, FNP-BC  [facility], [facility]  10/08/2015 8:52 AM

## 2015-10-08 NOTE — Patient Instructions (Addendum)
IF you received an x-ray today, you will receive an invoice from [facility]. Please contact [facility] at [phone] with questions or concerns regarding your invoice.   IF you received labwork today, you will receive an invoice from [employer]. Please contact [facility] at [phone] with questions or concerns regarding your invoice.   Our billing staff will not be able to assist you with questions regarding bills from these companies.  You will be contacted with the lab results as soon as they are available. The fastest way to get your results is to activate your My Chart account. Instructions are located on the last page of this paperwork. If you have not heard from [place] regarding the results in 2 weeks, please contact this office.     Prednisone tablets What is this medicine? PREDNISONE (PRED ni sone) is a corticosteroid. It is commonly used to treat inflammation of the skin, joints, lungs, and other organs. Common conditions treated include asthma, allergies, and arthritis. It is also used for other conditions, such as blood disorders and diseases of the adrenal glands. This medicine may be used for other purposes; ask your health care provider or pharmacist if you have questions. What should I tell my health care provider before I take this medicine? They need to know if you have any of these conditions: -Cushing's syndrome -diabetes -glaucoma -heart disease -high blood pressure -infection (especially a virus infection such as chickenpox, cold sores, or herpes) -kidney disease -liver disease -mental illness -myasthenia gravis -osteoporosis -seizures -stomach or intestine problems -thyroid disease -an unusual or allergic reaction to lactose, prednisone, other medicines, foods, dyes, or preservatives -pregnant or trying to get pregnant -breast-feeding How should I use this medicine? Take this medicine by mouth with a glass  of water. Follow the directions on the prescription label. Take this medicine with food. If you are taking this medicine once a day, take it in the morning. Do not take more medicine than you are told to take. Do not suddenly stop taking your medicine because you may develop a severe reaction. Your doctor will tell you how much medicine to take. If your doctor wants you to stop the medicine, the dose may be slowly lowered over time to avoid any side effects. Talk to your pediatrician regarding the use of this medicine in children. Special care may be needed. Overdosage: If you think you have taken too much of this medicine contact a poison control center or emergency room at once. NOTE: This medicine is only for you. Do not share this medicine with others. What if I miss a dose? If you miss a dose, take it as soon as you can. If it is almost time for your next dose, talk to your doctor or health care professional. You may need to miss a dose or take an extra dose. Do not take double or extra doses without advice. What may interact with this medicine? Do not take this medicine with any of the following medications: -metyrapone -mifepristone This medicine may also interact with the following medications: -aminoglutethimide -amphotericin B -aspirin and aspirin-like medicines -barbiturates -certain medicines for diabetes, like glipizide or glyburide -cholestyramine -cholinesterase inhibitors -cyclosporine -digoxin -diuretics -ephedrine -male hormones, like estrogens and birth control pills -isoniazid -ketoconazole -NSAIDS, medicines for pain and inflammation, like ibuprofen or naproxen -phenytoin -rifampin -toxoids -vaccines -warfarin This list may not describe all possible interactions. Give your health care provider a list of all the medicines, herbs, non-prescription drugs, or  dietary supplements you use. Also tell them if you smoke, drink alcohol, or use illegal drugs. Some items may  interact with your medicine. What should I watch for while using this medicine? Visit your doctor or health care professional for regular checks on your progress. If you are taking this medicine over a prolonged period, carry an identification card with your name and address, the type and dose of your medicine, and your doctor's name and address. This medicine may increase your risk of getting an infection. Tell your doctor or health care professional if you are around anyone with measles or chickenpox, or if you develop sores or blisters that do not heal properly. If you are going to have surgery, tell your doctor or health care professional that you have taken this medicine within the last twelve months. Ask your doctor or health care professional about your diet. You may need to lower the amount of salt you eat. This medicine may affect blood sugar levels. If you have diabetes, check with your doctor or health care professional before you change your diet or the dose of your diabetic medicine. What side effects may I notice from receiving this medicine? Side effects that you should report to your doctor or health care professional as soon as possible: -allergic reactions like skin rash, itching or hives, swelling of the face, lips, or tongue -changes in emotions or moods -changes in vision -depressed mood -eye pain -fever or chills, cough, sore throat, pain or difficulty passing urine -increased thirst -swelling of ankles, feet Side effects that usually do not require medical attention (report to your doctor or health care professional if they continue or are bothersome): -confusion, excitement, restlessness -headache -nausea, vomiting -skin problems, acne, thin and shiny skin -trouble sleeping -weight gain This list may not describe all possible side effects. Call your doctor for medical advice about side effects. You may report side effects to FDA at 1-800-FDA-1088. Where should I keep  my medicine? Keep out of the reach of children. Store at room temperature between 15 and 30 degrees C (59 and 86 degrees F). Protect from light. Keep container tightly closed. Throw away any unused medicine after the expiration date. NOTE: This sheet is a summary. It may not cover all possible information. If you have questions about this medicine, talk to your doctor, pharmacist, or health care provider.    2016, Elsevier/Gold Standard. (2010-12-16 10:57:14) Gout Gout is an inflammatory arthritis caused by a buildup of uric acid crystals in the joints. Uric acid is a chemical that is normally present in the blood. When the level of uric acid in the blood is too high it can form crystals that deposit in your joints and tissues. This causes joint redness, soreness, and swelling (inflammation). Repeat attacks are common. Over time, uric acid crystals can form into masses (tophi) near a joint, destroying bone and causing disfigurement. Gout is treatable and often preventable. CAUSES  The disease begins with elevated levels of uric acid in the blood. Uric acid is produced by your body when it breaks down a naturally found substance called purines. Certain foods you eat, such as meats and fish, contain high amounts of purines. Causes of an elevated uric acid level include:  Being passed down from parent to child (heredity).  Diseases that cause increased uric acid production (such as obesity, psoriasis, and certain cancers).  Excessive alcohol use.  Diet, especially diets rich in meat and seafood.  Medicines, including certain cancer-fighting medicines (chemotherapy), water pills (  diuretics), and aspirin.  Chronic kidney disease. The kidneys are no longer able to remove uric acid well.  Problems with metabolism. Conditions strongly associated with gout include:  Obesity.  High blood pressure.  High cholesterol.  Diabetes. Not everyone with elevated uric acid levels gets gout. It is not  understood why some people get gout and others do not. Surgery, joint injury, and eating too much of certain foods are some of the factors that can lead to gout attacks. SYMPTOMS   An attack of gout comes on quickly. It causes intense pain with redness, swelling, and warmth in a joint.  Fever can occur.  Often, only one joint is involved. Certain joints are more commonly involved:  Base of the big toe.  Knee.  Ankle.  Wrist.  Finger. Without treatment, an attack usually goes away in a few days to weeks. Between attacks, you usually will not have symptoms, which is different from many other forms of arthritis. DIAGNOSIS  Your caregiver will suspect gout based on your symptoms and exam. In some cases, tests may be recommended. The tests may include:  Blood tests.  Urine tests.  X-rays.  Joint fluid exam. This exam requires a needle to remove fluid from the joint (arthrocentesis). Using a microscope, gout is confirmed when uric acid crystals are seen in the joint fluid. TREATMENT  There are two phases to gout treatment: treating the sudden onset (acute) attack and preventing attacks (prophylaxis).  Treatment of an Acute Attack.  Medicines are used. These include anti-inflammatory medicines or steroid medicines.  An injection of steroid medicine into the affected joint is sometimes necessary.  The painful joint is rested. Movement can worsen the arthritis.  You may use warm or cold treatments on painful joints, depending which works best for you.  Treatment to Prevent Attacks.  If you suffer from frequent gout attacks, your caregiver may advise preventive medicine. These medicines are started after the acute attack subsides. These medicines either help your kidneys eliminate uric acid from your body or decrease your uric acid production. You may need to stay on these medicines for a very long time.  The early phase of treatment with preventive medicine can be associated with  an increase in acute gout attacks. For this reason, during the first few months of treatment, your caregiver may also advise you to take medicines usually used for acute gout treatment. Be sure you understand your caregiver's directions. Your caregiver may make several adjustments to your medicine dose before these medicines are effective.  Discuss dietary treatment with your caregiver or dietitian. Alcohol and drinks high in sugar and fructose and foods such as meat, poultry, and seafood can increase uric acid levels. Your caregiver or dietitian can advise you on drinks and foods that should be limited. HOME CARE INSTRUCTIONS   Do not take aspirin to relieve pain. This raises uric acid levels.  Only take over-the-counter or prescription medicines for pain, discomfort, or fever as directed by your caregiver.  Rest the joint as much as possible. When in bed, keep sheets and blankets off painful areas.  Keep the affected joint raised (elevated).  Apply warm or cold treatments to painful joints. Use of warm or cold treatments depends on which works best for you.  Use crutches if the painful joint is in your leg.  Drink enough fluids to keep your urine clear or pale yellow. This helps your body get rid of uric acid. Limit alcohol, sugary drinks, and fructose drinks.  Follow  your dietary instructions. Pay careful attention to the amount of protein you eat. Your daily diet should emphasize fruits, vegetables, whole grains, and fat-free or low-fat milk products. Discuss the use of coffee, vitamin C, and cherries with your caregiver or dietitian. These may be helpful in lowering uric acid levels.  Maintain a healthy body weight. SEEK MEDICAL CARE IF:   You develop diarrhea, vomiting, or any side effects from medicines.  You do not feel better in 24 hours, or you are getting worse. SEEK IMMEDIATE MEDICAL CARE IF:   Your joint becomes suddenly more tender, and you have chills or a fever. MAKE  SURE YOU:   Understand these instructions.  Will watch your condition.  Will get help right away if you are not doing well or get worse.   This information is not intended to replace advice given to you by your health care provider. Make sure you discuss any questions you have with your health care provider.   Document Released: 04/29/2000 Document Revised: 05/23/2014 Document Reviewed: 12/14/2011 Elsevier Interactive Patient Education [employer]

## 2015-10-30 ENCOUNTER — Ambulatory Visit: Payer: BLUE CROSS/BLUE SHIELD | Admitting: Adult Health

## 2015-11-12 ENCOUNTER — Other Ambulatory Visit (INDEPENDENT_AMBULATORY_CARE_PROVIDER_SITE_OTHER): Payer: BLUE CROSS/BLUE SHIELD

## 2015-11-12 DIAGNOSIS — I10 Essential (primary) hypertension: Secondary | ICD-10-CM | POA: Diagnosis not present

## 2015-11-12 DIAGNOSIS — E119 Type 2 diabetes mellitus without complications: Secondary | ICD-10-CM | POA: Diagnosis not present

## 2015-11-12 LAB — BASIC METABOLIC PANEL
BUN: 20 mg/dL (ref 6–23)
CHLORIDE: 106 meq/L (ref 96–112)
CO2: 27 meq/L (ref 19–32)
CREATININE: 0.98 mg/dL (ref 0.40–1.50)
Calcium: 9.5 mg/dL (ref 8.4–10.5)
GFR: 82.45 mL/min (ref >60.00)
Glucose, Bld: 119 mg/dL — ABNORMAL HIGH (ref 70–99)
POTASSIUM: 4 meq/L (ref 3.5–5.1)
Sodium: 141 mEq/L (ref 135–145)

## 2015-11-12 LAB — HEMOGLOBIN A1C: HEMOGLOBIN A1C: 5.6 % (ref 4.6–6.5)

## 2015-11-16 ENCOUNTER — Ambulatory Visit (INDEPENDENT_AMBULATORY_CARE_PROVIDER_SITE_OTHER): Payer: BLUE CROSS/BLUE SHIELD | Admitting: Adult Health

## 2015-11-16 ENCOUNTER — Encounter: Payer: Self-pay | Admitting: Adult Health

## 2015-11-16 VITALS — BP 142/90 | HR 69 | Temp 98.1°F | Ht 73.0 in | Wt 301.0 lb

## 2015-11-16 DIAGNOSIS — G4733 Obstructive sleep apnea (adult) (pediatric): Secondary | ICD-10-CM

## 2015-11-16 DIAGNOSIS — E669 Obesity, unspecified: Secondary | ICD-10-CM | POA: Diagnosis not present

## 2015-11-16 NOTE — Addendum Note (Signed)
Addended by: Karalee HeightOX, Calloway Andrus P on: 11/16/2015 10:47 AM   Modules accepted: Orders

## 2015-11-16 NOTE — Assessment & Plan Note (Signed)
Wt loss  

## 2015-11-16 NOTE — Patient Instructions (Addendum)
Continue on CPAP At bedtime   Keep up good work  Work on weight loss .  Do not drive if sleepy .  Change your CPAP pressure to 13cmH20.  Follow up with Dr. Vassie LollAlva  In 4 months and As needed

## 2015-11-16 NOTE — Assessment & Plan Note (Signed)
Severe sleep apnea, well controlled on nocturnal C Pap. Download shows excellent compliance. We'll change C Pap to a set pressure. At 13 cm H2O  Plan  Continue on CPAP At bedtime   Keep up good work  Work on weight loss .  Do not drive if sleepy .  Change your CPAP pressure to 13cmH20.  Follow up with Dr. Vassie LollAlva  In 4 months and As needed

## 2015-11-16 NOTE — Progress Notes (Signed)
Subjective:    Patient ID: Ross [name], male    DOB: 1955/10/04, 60 y.o.   MRN: [id number]  HPI 60 year old male seen for sleep consult 05/27/2015 with Dr. Vassie LollAlva, patient had a previous diagnosis of moderate sleep apnea on sleep test in 2011 with AHI 19hr.set up for HST 05/2015 found to have severe OSA .    TEST  PSG 2011 AHI 19/hr  Home sleep study January 2017 with AHI of 42.4/hr , low sat 61%   11/16/2015 Follow-up sleep apnea Patient returns for a six-month follow-up for sleep apnea. Patient was seen for sleep consult in January for sleep apnea. Patient had previous he been diagnosed with sleep apnea 2011 with moderate sleep apnea with an AHI at 19 an hour. Patient had been intolerant to C Pap.  Patient was set up for home sleep study in January 2017 that showed severe sleep apnea with AHI of 42.4 an hour, lowest O2 saturation 61%. He was set up with his C Pap titration study April 2017 that showed optimal CPAP control at 13 cm of H2O. Patient says since starting C Pap. He is feeling much improved with improved rest. He denies significant daytime sleepiness. Download shows excellent compliance with average usage at 7 hours. He is on AutoSet 5-20 cm of H2O. AHI was 3.5. Positive leaks. Average pressure was at 13. Patient says he feels that he does not have enough pressure when he first puts on his C Pap machine. He denies he chest pain, orthopnea, PND, or increased leg swelling.  Past Medical History  Diagnosis Date  . Family history of malignant neoplasm of gastrointestinal tract     mother  . Sleep apnea   . Gout   . Hypertension   . DDD (degenerative disc disease)   . GERD (gastroesophageal reflux disease)   . Rectal bleeding   . Hemorrhoids   . Fatty liver   . Anal fissure   . Arthritis   . Glaucoma   . Stroke [facility](HCC)     in right eye      Review of Systems Constitutional:   No  weight loss, night sweats,  Fevers, chills,  +fatigue, or  lassitude.  HEENT:   No  headaches,  Difficulty swallowing,  Tooth/dental problems, or  Sore throat,                No sneezing, itching, ear ache, nasal congestion, post nasal drip,   CV:  No chest pain,  Orthopnea, PND, swelling in lower extremities, anasarca, dizziness, palpitations, syncope.   GI  No heartburn, indigestion, abdominal pain, nausea, vomiting, diarrhea, change in bowel habits, loss of appetite, bloody stools.   Resp: No shortness of breath with exertion or at rest.  No excess mucus, no productive cough,  No non-productive cough,  No coughing up of blood.  No change in color of mucus.  No wheezing.  No chest wall deformity  Skin: no rash or lesions.  GU: no dysuria, change in color of urine, no urgency or frequency.  No flank pain, no hematuria   MS:  No joint pain or swelling.  No decreased range of motion.  No back pain.  Psych:  No change in mood or affect. No depression or anxiety.  No memory loss.         Objective:   Physical Exam Filed Vitals:   11/16/15 0950  BP: 142/90  Pulse: 69  Temp: 98.1 F (36.7 C)  TempSrc: Oral  Height: 6'  1" (1.854 m)  Weight: 301 lb (136.533 kg)  SpO2: 98%  Body mass index is 39.72 kg/(m^2).   GEN: A/Ox3; pleasant , NAD, obese   HEENT:  [place]/AT,  EACs-clear, TMs-wnl, NOSE-clear, THROAT-clear, no lesions, no postnasal drip or exudate noted. Class 3 MP airway .   NECK:  Supple w/ fair ROM; no JVD; normal carotid impulses w/o bruits; no thyromegaly or nodules palpated; no lymphadenopathy.  RESP  Clear  P & A; w/o, wheezes/ rales/ or rhonchi.no accessory muscle use, no dullness to percussion  CARD:  RRR, no m/r/g  , no peripheral edema, pulses intact, no cyanosis or clubbing.  GI:   Soft & nt; nml bowel sounds; no organomegaly or masses detected.  Musco: Warm bil, no deformities or joint swelling noted.   Neuro: alert, no focal deficits noted.    Skin: Warm, no lesions or rashes  Tammy Parrett NP-C  [facility]    11/16/2015        Assessment & Plan:

## 2015-11-25 ENCOUNTER — Encounter: Payer: Self-pay | Admitting: Adult Health

## 2016-01-07 ENCOUNTER — Ambulatory Visit (INDEPENDENT_AMBULATORY_CARE_PROVIDER_SITE_OTHER): Payer: BLUE CROSS/BLUE SHIELD | Admitting: Ophthalmology

## 2016-01-07 DIAGNOSIS — H43813 Vitreous degeneration, bilateral: Secondary | ICD-10-CM

## 2016-01-07 DIAGNOSIS — H348312 Tributary (branch) retinal vein occlusion, right eye, stable: Secondary | ICD-10-CM

## 2016-01-07 DIAGNOSIS — I10 Essential (primary) hypertension: Secondary | ICD-10-CM

## 2016-01-07 DIAGNOSIS — H2513 Age-related nuclear cataract, bilateral: Secondary | ICD-10-CM | POA: Diagnosis not present

## 2016-01-07 DIAGNOSIS — H35033 Hypertensive retinopathy, bilateral: Secondary | ICD-10-CM | POA: Diagnosis not present

## 2016-02-08 ENCOUNTER — Ambulatory Visit (INDEPENDENT_AMBULATORY_CARE_PROVIDER_SITE_OTHER): Payer: BLUE CROSS/BLUE SHIELD | Admitting: Family Medicine

## 2016-02-08 ENCOUNTER — Encounter: Payer: Self-pay | Admitting: Family Medicine

## 2016-02-08 VITALS — BP 122/76 | HR 71 | Temp 98.7°F | Ht 72.5 in | Wt 303.8 lb

## 2016-02-08 DIAGNOSIS — R351 Nocturia: Secondary | ICD-10-CM

## 2016-02-08 DIAGNOSIS — Z23 Encounter for immunization: Secondary | ICD-10-CM | POA: Diagnosis not present

## 2016-02-08 DIAGNOSIS — N401 Enlarged prostate with lower urinary tract symptoms: Secondary | ICD-10-CM | POA: Diagnosis not present

## 2016-02-08 DIAGNOSIS — M1A00X0 Idiopathic chronic gout, unspecified site, without tophus (tophi): Secondary | ICD-10-CM

## 2016-02-08 DIAGNOSIS — I10 Essential (primary) hypertension: Secondary | ICD-10-CM

## 2016-02-08 DIAGNOSIS — N529 Male erectile dysfunction, unspecified: Secondary | ICD-10-CM | POA: Diagnosis not present

## 2016-02-08 MED ORDER — SILDENAFIL CITRATE 100 MG PO TABS: 50.0000 mg | ORAL_TABLET | Freq: Every day | ORAL | 11 refills | Status: DC | PRN

## 2016-02-08 NOTE — Assessment & Plan Note (Signed)
He Stopped his Allopurinol 300 mg months ago and has only had 1 flare- he is worried about SE. Consider getting off hydrochlorothiazide- declines. I warned him of long term risks of gout- wants to stay off for now- obviously may reconsider if has flares

## 2016-02-08 NOTE — Progress Notes (Signed)
Pre visit review using our clinic review tool, if applicable. No additional management support is needed unless otherwise documented below in the visit note. 

## 2016-02-08 NOTE — Patient Instructions (Addendum)
Flu shot today  Call your insurace about the shingles shot to see if it is covered or how much it would cost and where is cheaper (here or pharmacy).  If you want to receive here, call for nurse visit.  Schedule physical 08/09/16 or later. Labs 07/29/16 or later.Ask for uric acid under gout for next physical and a1c under hyperglycemia. These should be fasting labs  No changes today though we discussed risks of hydrochlorothiazide with your gout

## 2016-02-08 NOTE — Progress Notes (Signed)
Phone: [phone]  Subjective:  Patient presents today to establish care with me as their new primary care provider. Patient was formerly a patient of Dr. Tawanna Cooler. Chief complaint-noted.   See problem oriented charting- ROS- No chest pain or shortness of breath. No headache or blurry vision.   The following were reviewed and entered/updated in epic: Past Medical History:  Diagnosis Date  . Anal fissure   . Arthritis   . DDD (degenerative disc disease)   . Family history of malignant neoplasm of gastrointestinal tract    mother  . Fatty liver   . GERD (gastroesophageal reflux disease)   . Glaucoma   . Gout   . Hemorrhoids   . Hypertension   . Rectal bleeding   . Sleep apnea   . Stroke [facility])    in right eye   Patient Active Problem List   Diagnosis Date Noted  . Elevated blood sugar 08/10/2015    Priority: Medium  . BPH associated with nocturia 01/12/2015    Priority: Medium  . Essential hypertension 11/27/2009    Priority: Medium  . Obstructive sleep apnea 10/29/2009    Priority: Medium  . Gout 01/25/2007    Priority: Medium  . Back pain with right-sided sciatica 09/02/2013    Priority: Low  . GERD (gastroesophageal reflux disease) 02/18/2011    Priority: Low  . Morbid obesity (HCC) 04/02/2009    Priority: Low  . ERECTILE DYSFUNCTION, ORGANIC 01/25/2007    Priority: Low  . Diarrhea 01/27/2015   Past Surgical History:  Procedure Laterality Date  . EYE SURGERY Right    laser   . GLAUCOMA SURGERY Bilateral   . LUMBAR DISC SURGERY     L5  . PYLOROPLASTY     pyloric stenosis, as infant  . TONSILLECTOMY      Family History  Problem Relation Age of Onset  . Colon cancer Mother   . Stomach cancer Paternal Grandmother   . Prostate cancer Father   . Diabetes Maternal Grandmother   . Heart disease Maternal Grandmother   . Breast cancer Mother   . Testicular cancer Father   . Heart disease Maternal Grandfather     Medications- reviewed and updated Current  Outpatient Prescriptions  Medication Sig Dispense Refill  . aspirin 81 MG tablet Take 81 mg by mouth daily.      Marland Kitchen losartan-hydrochlorothiazide (HYZAAR) 100-12.5 MG tablet Take 1 tablet by mouth daily. 100 tablet 3  . Multiple Vitamin (MULTIVITAMIN) tablet Take 1 tablet by mouth. Once or twice a week    . omeprazole (PRILOSEC) 40 MG capsule TAKE 1 CAPSULE BY MOUTH EVERY MORNING. 100 capsule 3  . sildenafil (VIAGRA) 100 MG tablet Take 0.5-1 tablets (50-100 mg total) by mouth daily as needed for erectile dysfunction. 10 tablet 11   Allergies-reviewed and updated No Known Allergies  Social History   Social History  . Marital status: Married    Spouse name: Isabelle Course  . Number of children: 3  . Years of education: N/A   Occupational History  . Location manager   .  [employer].   Social History Main Topics  . Smoking status: Former Smoker    Packs/day: 1.00    Years: 20.00    Types: Cigarettes    Quit date: 05/17/1991  . Smokeless tobacco: Never Used  . Alcohol use 1.0 oz/week    2 drink(s) per week     Comment: Social use   . Drug use: No  . Sexual activity: Yes  Other Topics Concern  . None   Social History Narrative   Married- wife on liver transplant HCV- he was tested and negative. 2 children prior marriage. 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       Hobbies: out to eat, shopping   Volleyball, cycling, basketball in past.    Family history researc    ROS--See HPI   Objective: BP 122/76 (BP Location: Left Arm, Patient Position: Sitting, Cuff Size: Large)   Pulse 71   Temp 98.7 F (37.1 C) (Oral)   Ht 6' 0.5" (1.842 m)   Wt (!) 303 lb 12.8 oz (137.8 kg)   SpO2 94%   BMI 40.64 kg/m  Gen: NAD, resting comfortably CV: RRR no murmurs rubs or gallops Lungs: CTAB no crackles, wheeze, rhonchi Abdomen: soft/nontender/nondistended/normal bowel sounds. obese Ext: trace edema Skin: warm, dry Neuro: grossly  normal, moves all extremities, PERRLA  Assessment/Plan:  Morbid obesity (HCC) S: weight has been trending up with stressors of caring for wife. Diet drinks, juices, unsweet tea. A lot of processed food- low plant diet with minimal fruits/veggies Wt Readings from Last 3 Encounters:  02/08/16 (!) 303 lb 12.8 oz (137.8 kg)  11/16/15 (!) 301 lb (136.5 kg)  10/08/15 292 lb (132.5 kg)  A/P: Extended counseling today on diet/exercise. Goals for him is to shift to water only for beverage, stop skipping meals, exercise 150 minutes a week, up veggies and fruits. 25 lb goal in 6 months but offered sooner follow up if needed.    BPH associated with nocturia Nocturia 2-3 x a month- BPH previously noted. He asks about stopping HCTZ and I told him I would advise this but in the end he stated he was more fearful of change and poor BP control than gout flares and nocturia (thought likely would have nocturia even without this due to BPH)   Essential hypertension Controlled on Losartan-hydrochlorothiazide 100-12.5  but as noted- advised stopping hctz and consider amlodipine 5mg - he declines for now  Gout He Stopped his Allopurinol 300 mg months ago and has only had 1 flare- he is worried about SE. Consider getting off hydrochlorothiazide- declines. I warned him of long term risks of gout- wants to stay off for now- obviously may reconsider if has flares  6 months CPE  Orders Placed This Encounter  Procedures  . Flu Vaccine QUAD 36+ mos IM    Meds ordered this encounter  Medications  . sildenafil (VIAGRA) 100 MG tablet    Sig: Take 0.5-1 tablets (50-100 mg total) by mouth daily as needed for erectile dysfunction.    Dispense:  10 tablet    Refill:  11   The duration of face-to-face time during this visit was greater than 30 minutes. Greater than 50% of this time was spent in counseling on weight loss/lifestyle changes and HCTZ risks in context of gout and BPH  Return precautions advised.    Tana ConchStephen Britton Perkinson, MD

## 2016-02-08 NOTE — Assessment & Plan Note (Signed)
Nocturia 2-3 x a month- BPH previously noted. He asks about stopping HCTZ and I told him I would advise this but in the end he stated he was more fearful of change and poor BP control than gout flares and nocturia (thought likely would have nocturia even without this due to BPH)

## 2016-02-08 NOTE — Assessment & Plan Note (Signed)
S: weight has been trending up with stressors of caring for wife. Diet drinks, juices, unsweet tea. A lot of processed food- low plant diet with minimal fruits/veggies Wt Readings from Last 3 Encounters:  02/08/16 (!) 303 lb 12.8 oz (137.8 kg)  11/16/15 (!) 301 lb (136.5 kg)  10/08/15 292 lb (132.5 kg)  A/P: Extended counseling today on diet/exercise. Goals for him is to shift to water only for beverage, stop skipping meals, exercise 150 minutes a week, up veggies and fruits. 25 lb goal in 6 months but offered sooner follow up if needed.

## 2016-02-08 NOTE — Assessment & Plan Note (Signed)
Controlled on Losartan-hydrochlorothiazide 100-12.5  but as noted- advised stopping hctz and consider amlodipine 5mg - he declines for now

## 2016-03-10 ENCOUNTER — Ambulatory Visit (INDEPENDENT_AMBULATORY_CARE_PROVIDER_SITE_OTHER): Payer: BLUE CROSS/BLUE SHIELD | Admitting: Family Medicine

## 2016-03-10 ENCOUNTER — Encounter: Payer: Self-pay | Admitting: Family Medicine

## 2016-03-10 DIAGNOSIS — M1A00X0 Idiopathic chronic gout, unspecified site, without tophus (tophi): Secondary | ICD-10-CM

## 2016-03-10 MED ORDER — FEBUXOSTAT 40 MG PO TABS: 40.0000 mg | ORAL_TABLET | Freq: Every day | ORAL | 5 refills | Status: DC

## 2016-03-10 MED ORDER — COLCHICINE 0.6 MG PO TABS: ORAL_TABLET | ORAL | 5 refills | Status: DC

## 2016-03-10 NOTE — Progress Notes (Signed)
Subjective:  Ross [name] is a 60 y.o. year old very pleasant male patient who presents for/with See problem oriented charting ROS- endorses ankle pain in recent weeks, no fever, chills, nausea, vomiting, abnormal fatigue.see any ROS included in HPI as well.   Past Medical History-  Patient Active Problem List   Diagnosis Date Noted  . Elevated blood sugar 08/10/2015    Priority: Medium  . BPH associated with nocturia 01/12/2015    Priority: Medium  . Essential hypertension 11/27/2009    Priority: Medium  . Obstructive sleep apnea 10/29/2009    Priority: Medium  . Gout 01/25/2007    Priority: Medium  . Back pain with right-sided sciatica 09/02/2013    Priority: Low  . GERD (gastroesophageal reflux disease) 02/18/2011    Priority: Low  . Morbid obesity (HCC) 04/02/2009    Priority: Low  . ERECTILE DYSFUNCTION, ORGANIC 01/25/2007    Priority: Low  . Diarrhea 01/27/2015    Medications- reviewed and updated Current Outpatient Prescriptions  Medication Sig Dispense Refill  . aspirin 81 MG tablet Take 81 mg by mouth daily.      Marland Kitchen losartan-hydrochlorothiazide (HYZAAR) 100-12.5 MG tablet Take 1 tablet by mouth daily. 100 tablet 3  . Multiple Vitamin (MULTIVITAMIN) tablet Take 1 tablet by mouth. Once or twice a week    . omeprazole (PRILOSEC) 40 MG capsule TAKE 1 CAPSULE BY MOUTH EVERY MORNING. 100 capsule 3  . sildenafil (VIAGRA) 100 MG tablet Take 0.5-1 tablets (50-100 mg total) by mouth daily as needed for erectile dysfunction. 10 tablet 11   No current facility-administered medications for this visit.     Objective: BP 134/86 (BP Location: Left Arm, Patient Position: Sitting, Cuff Size: Large)   Pulse 94   Temp 98.7 F (37.1 C) (Oral)   Wt (!) 304 lb 9.6 oz (138.2 kg)   SpO2 98%   BMI 40.74 kg/m  Gen: NAD, resting comfortably CV: RRR no murmurs rubs or gallops Lungs: CTAB no crackles, wheeze, rhonchi  Ext: no edema, mild erythema and warmth and tenderness at right  ankle  Assessment/Plan:  Gout S: prior on alloprurinol 300mg - stopped several months ago and as of about a month ago only had one flare. He was worried about SE of meds - darkened stool, chronic stomach upset. He declined stopping hctz last visit.   2 weeks ago in right ankle was severe. Friday started- by Saturday night could not sleep as he had severe pain despite scheduled nsaids- states could hardly walk. Got to [place] on [place]- did round of prednisone. Uric acid 7.9 at time. Yesterday started hurting again- going to beach in AM. Started nsaids again.   Has continued to gain weight despite extended counseling in past A/P: Poor control of gout off uric acid lower agent. Possible mild flare at present in right ankle- colchicine daily for a week and then start uloric 40mg  along with colchicine for prophylaxis while adjusting uric acid level at least a month then follow up with me in 6 weeks. Not ready to stop hctz  6 weeks  Meds ordered this encounter  Medications  . colchicine 0.6 MG tablet    Sig: Take 2 tablets at first sign of gout flare then repeat in 2 hours 1 tablet, take 1 tablet a day until resolved (or 10 days maximum)    Dispense:  30 tablet    Refill:  5  . febuxostat (ULORIC) 40 MG tablet    Sig: Take 1 tablet (40 mg  total) by mouth daily.    Dispense:  30 tablet    Refill:  5    Return precautions advised.  Tana ConchStephen Hunter, MD

## 2016-03-10 NOTE — Progress Notes (Signed)
Pre visit review using our clinic review tool, if applicable. No additional management support is needed unless otherwise documented below in the visit note. 

## 2016-03-10 NOTE — Patient Instructions (Signed)
Take colchcine 2 tablets today then if still having ankle pain repeat with 1 tablet in 2 hours then take daily for at least the next month  Start uloric in 1 week if no sign of gout flare. Continue colchicine with this. See me in about 6 weeks so we can see how you are doing. Sooner if new or worsening or unresolved symptoms

## 2016-03-10 NOTE — Assessment & Plan Note (Addendum)
S: prior on alloprurinol 300mg - stopped several months ago and as of about a month ago only had one flare. He was worried about SE of meds - darkened stool, chronic stomach upset. He declined stopping hctz last visit.   2 weeks ago in right ankle was severe. Friday started- by Saturday night could not sleep as he had severe pain despite scheduled nsaids- states could hardly walk. Got to [place] on [place]- did round of prednisone. Uric acid 7.9 at time. Yesterday started hurting again- going to beach in AM. Started nsaids again.   Has continued to gain weight despite extended counseling in past A/P: Poor control of gout off uric acid lower agent. Possible mild flare at present in right ankle- colchicine daily for a week and then start uloric 40mg  along with colchicine for prophylaxis while adjusting uric acid level at least a month then follow up with me in 6 weeks. Not ready to stop hctz

## 2016-03-18 ENCOUNTER — Encounter: Payer: Self-pay | Admitting: Pulmonary Disease

## 2016-03-18 ENCOUNTER — Ambulatory Visit (INDEPENDENT_AMBULATORY_CARE_PROVIDER_SITE_OTHER): Payer: BLUE CROSS/BLUE SHIELD | Admitting: Pulmonary Disease

## 2016-03-18 DIAGNOSIS — I10 Essential (primary) hypertension: Secondary | ICD-10-CM | POA: Diagnosis not present

## 2016-03-18 DIAGNOSIS — G4733 Obstructive sleep apnea (adult) (pediatric): Secondary | ICD-10-CM | POA: Diagnosis not present

## 2016-03-18 NOTE — Progress Notes (Signed)
   Subjective:    Patient ID: Ross [name], male    DOB: [birth year], 60 y.o.   MRN: [id number]  HPI  60 year old male for FU of  severe OSA .   03/18/2016  Chief Complaint  Patient presents with  . Follow-up    Pt. uses a CPAP, Pt. using it every night,Pt. is doing well  Pt. would like to discuss the pressure,Pt. states he is sleeping 4-5 hour at night DME APS   He was started on CPAP 13 cm with good results He reports improvement in his daytime somnolence and fatigue, wife has not noted snoring. He is able to tolerate his full face mask well and he reports improvement in sinus drainage with humidity Download shows excellent compliance more than 8 hours per night with large leak and no residual events Weight is unchanged Nocturia has decreased  He does not like the low pressure setting when he starts CPAP    TEST  PSG 2011 AHI 19/hr  Home sleep study January 2017 with AHI of 42.4/hr , low sat 61% 08/2015  CPAP >> 13 cm of H2O.  Review of Systems Patient denies significant dyspnea,cough, hemoptysis,  chest pain, palpitations, pedal edema, orthopnea, paroxysmal nocturnal dyspnea, lightheadedness, nausea, vomiting, abdominal or  leg pains      Objective:   Physical Exam  Gen. Pleasant, obese, in no distress ENT - no lesions, no post nasal drip Neck: No JVD, no thyromegaly, no carotid bruits Lungs: no use of accessory muscles, no dullness to percussion, decreased without rales or rhonchi  Cardiovascular: Rhythm regular, heart sounds  normal, no murmurs or gallops, no peripheral edema Musculoskeletal: No deformities, no cyanosis or clubbing , no tremors       Assessment & Plan:

## 2016-03-18 NOTE — Assessment & Plan Note (Signed)
CPAP is set at 13 cm We will send prescription to decrease ramp time to 5 minutes  CPAP supplies will be renewed for a year  Weight loss encouraged, compliance with goal of at least 4-6 hrs every night is the expectation. Advised against medications with sedative side effects Cautioned against driving when sleepy - understanding that sleepiness will vary on a day to day basis

## 2016-03-18 NOTE — Patient Instructions (Addendum)
Your CPAP is set at 13 cm We will send prescription to decrease ramp time to 5 minutes  CPAP supplies will be renewed for a year

## 2016-03-18 NOTE — Assessment & Plan Note (Signed)
Well controlled 

## 2016-03-18 NOTE — Addendum Note (Signed)
Addended by: Garfield CorneaMABRY, Samyah Bilbo L on: 03/18/2016 02:23 PM   Modules accepted: Orders

## 2016-03-21 ENCOUNTER — Ambulatory Visit (INDEPENDENT_AMBULATORY_CARE_PROVIDER_SITE_OTHER): Payer: BLUE CROSS/BLUE SHIELD | Admitting: Family Medicine

## 2016-03-21 DIAGNOSIS — Z2911 Encounter for prophylactic immunotherapy for respiratory syncytial virus (RSV): Secondary | ICD-10-CM

## 2016-03-21 DIAGNOSIS — Z23 Encounter for immunization: Secondary | ICD-10-CM | POA: Diagnosis not present

## 2016-03-30 NOTE — Progress Notes (Signed)
error 

## 2016-04-04 ENCOUNTER — Encounter: Payer: Self-pay | Admitting: Adult Health

## 2016-04-20 ENCOUNTER — Ambulatory Visit (INDEPENDENT_AMBULATORY_CARE_PROVIDER_SITE_OTHER): Payer: BLUE CROSS/BLUE SHIELD | Admitting: Family Medicine

## 2016-04-20 ENCOUNTER — Encounter: Payer: Self-pay | Admitting: Family Medicine

## 2016-04-20 DIAGNOSIS — I10 Essential (primary) hypertension: Secondary | ICD-10-CM | POA: Diagnosis not present

## 2016-04-20 DIAGNOSIS — M1A00X0 Idiopathic chronic gout, unspecified site, without tophus (tophi): Secondary | ICD-10-CM | POA: Diagnosis not present

## 2016-04-20 LAB — URIC ACID: URIC ACID, SERUM: 8.4 mg/dL — AB (ref 4.0–7.8)

## 2016-04-20 MED ORDER — LOSARTAN POTASSIUM 100 MG PO TABS: 100.0000 mg | ORAL_TABLET | Freq: Every day | ORAL | 3 refills | Status: DC

## 2016-04-20 MED ORDER — ALLOPURINOL 100 MG PO TABS: 100.0000 mg | ORAL_TABLET | Freq: Every day | ORAL | 3 refills | Status: DC

## 2016-04-20 NOTE — Assessment & Plan Note (Signed)
S: controlled on Losartan-hydrochlorothiazide 100-12.5 BP Readings from Last 3 Encounters:  04/20/16 118/82  03/18/16 124/82  03/10/16 134/86  A/P: stop hctz portion. Advised starting amlodipine 2.5mg  but would prefer to work on diet/exercise and continue losartan then reassess in march

## 2016-04-20 NOTE — Progress Notes (Signed)
Pre visit review using our clinic review tool, if applicable. No additional management support is needed unless otherwise documented below in the visit note. 

## 2016-04-20 NOTE — Assessment & Plan Note (Signed)
S: never started uloric as $75- and didn't use coupon card. Had 1 more attack over thanksgiving- left ankle for 2 days but colchicine helped. Unfortunately he lost his wife since we saw him wife to liver cancer. Now Since June 5 attacks. No alcohol, no steaks, eating better- still veggies low in diet (in regards to weight loss). Started Tart cherry juice.  A/P: finally agrees to stop hctz. Wants to go back to allopurinol but had dark stools chronic stomach upset on 300mg - will trial 100mg . Get uric acid baseline today then follow up in march for physical.

## 2016-04-20 NOTE — Patient Instructions (Signed)
Labs before you leave for uric acid check  Stop the combo blood pressure pill, start losartan alone  Start allopurinol 100mg - for at least 2 weeks take colchicine once a day as well  So sorry for the loss of your wife. Thanks for considering the hospice grief counseling option and glad family is around to support you

## 2016-04-20 NOTE — Progress Notes (Signed)
Subjective:  Ross [name] is a 60 y.o. year old very pleasant male patient who presents for/with See problem oriented charting ROS- No chest pain or shortness of breath. No headache or blurry vision.  1 gout flare in left ankle- now resolved   Past Medical History-  Patient Active Problem List   Diagnosis Date Noted  . Elevated blood sugar 08/10/2015    Priority: Medium  . BPH associated with nocturia 01/12/2015    Priority: Medium  . Essential hypertension 11/27/2009    Priority: Medium  . Obstructive sleep apnea 10/29/2009    Priority: Medium  . Gout 01/25/2007    Priority: Medium  . Back pain with right-sided sciatica 09/02/2013    Priority: Low  . GERD (gastroesophageal reflux disease) 02/18/2011    Priority: Low  . Morbid obesity (HCC) 04/02/2009    Priority: Low  . ERECTILE DYSFUNCTION, ORGANIC 01/25/2007    Priority: Low  . Diarrhea 01/27/2015    Medications- reviewed and updated Current Outpatient Prescriptions  Medication Sig Dispense Refill  . aspirin 81 MG tablet Take 81 mg by mouth daily.      . colchicine 0.6 MG tablet Take 2 tablets at first sign of gout flare then repeat in 2 hours 1 tablet, take 1 tablet a day until resolved (or 10 days maximum) 30 tablet 5  . Multiple Vitamin (MULTIVITAMIN) tablet Take 1 tablet by mouth. Once or twice a week    . omeprazole (PRILOSEC) 40 MG capsule TAKE 1 CAPSULE BY MOUTH EVERY MORNING. 100 capsule 3  . sildenafil (VIAGRA) 100 MG tablet Take 0.5-1 tablets (50-100 mg total) by mouth daily as needed for erectile dysfunction. 10 tablet 11  . allopurinol (ZYLOPRIM) 100 MG tablet Take 1 tablet (100 mg total) by mouth daily. 90 tablet 3  . losartan (COZAAR) 100 MG tablet Take 1 tablet (100 mg total) by mouth daily. 90 tablet 3   No current facility-administered medications for this visit.     Objective: BP 118/82 (BP Location: Left Arm, Patient Position: Sitting, Cuff Size: Large)   Pulse 79   Temp 98.1 F (36.7 C) (Oral)    Ht 6' (1.829 m)   Wt 299 lb 3.2 oz (135.7 kg)   SpO2 98%   BMI 40.58 kg/m  Gen: NAD, resting comfortably CV: RRR no murmurs rubs or gallops Lungs: CTAB no crackles, wheeze, rhonchi Ext: trace edema, no erythema around ankle  Assessment/Plan:  Gout S: never started uloric as $75- and didn't use coupon card. Had 1 more attack over thanksgiving- left ankle for 2 days but colchicine helped. Unfortunately he lost his wife since we saw him wife to liver cancer. Now Since June 5 attacks. No alcohol, no steaks, eating better- still veggies low in diet (in regards to weight loss). Started Tart cherry juice.  A/P: finally agrees to stop hctz. Wants to go back to allopurinol but had dark stools chronic stomach upset on 300mg - will trial 100mg . Get uric acid baseline today then follow up in march for physical.    Essential hypertension S: controlled on Losartan-hydrochlorothiazide 100-12.5 BP Readings from Last 3 Encounters:  04/20/16 118/82  03/18/16 124/82  03/10/16 134/86  A/P: stop hctz portion. Advised starting amlodipine 2.5mg  but would prefer to work on diet/exercise and continue losartan then reassess in march  March follow up  Orders Placed This Encounter  Procedures  . Uric Acid    Meds ordered this encounter  Medications  . losartan (COZAAR) 100 MG tablet  Sig: Take 1 tablet (100 mg total) by mouth daily.    Dispense:  90 tablet    Refill:  3  . allopurinol (ZYLOPRIM) 100 MG tablet    Sig: Take 1 tablet (100 mg total) by mouth daily.    Dispense:  90 tablet    Refill:  3    Return precautions advised.  Tana ConchStephen Veida Spira, MD

## 2016-06-03 ENCOUNTER — Encounter: Payer: Self-pay | Admitting: Family Medicine

## 2016-06-03 ENCOUNTER — Ambulatory Visit (INDEPENDENT_AMBULATORY_CARE_PROVIDER_SITE_OTHER): Payer: BLUE CROSS/BLUE SHIELD | Admitting: Family Medicine

## 2016-06-03 VITALS — BP 138/84 | HR 77 | Temp 98.8°F | Ht 72.0 in | Wt 301.0 lb

## 2016-06-03 DIAGNOSIS — R609 Edema, unspecified: Secondary | ICD-10-CM

## 2016-06-03 DIAGNOSIS — I10 Essential (primary) hypertension: Secondary | ICD-10-CM

## 2016-06-03 NOTE — Progress Notes (Signed)
Pre visit review using our clinic review tool, if applicable. No additional management support is needed unless otherwise documented below in the visit note. 

## 2016-06-03 NOTE — Progress Notes (Signed)
Subjective:  Darlen RoundCharles J Shed is a 61 y.o. year old very pleasant male patient who presents for/with See problem oriented charting ROS- No chest pain or shortness of breath. No headache or blurry vision. No gout attacks in last month thankfully   Past Medical History-  Patient Active Problem List   Diagnosis Date Noted  . Elevated blood sugar 08/10/2015    Priority: Medium  . BPH associated with nocturia 01/12/2015    Priority: Medium  . Essential hypertension 11/27/2009    Priority: Medium  . Obstructive sleep apnea 10/29/2009    Priority: Medium  . Gout 01/25/2007    Priority: Medium  . Back pain with right-sided sciatica 09/02/2013    Priority: Low  . GERD (gastroesophageal reflux disease) 02/18/2011    Priority: Low  . Morbid obesity (HCC) 04/02/2009    Priority: Low  . ERECTILE DYSFUNCTION, ORGANIC 01/25/2007    Priority: Low  . Diarrhea 01/27/2015    Medications- reviewed and updated Current Outpatient Prescriptions  Medication Sig Dispense Refill  . allopurinol (ZYLOPRIM) 100 MG tablet Take 1 tablet (100 mg total) by mouth daily. 90 tablet 3  . aspirin 81 MG tablet Take 81 mg by mouth daily.      . colchicine 0.6 MG tablet Take 2 tablets at first sign of gout flare then repeat in 2 hours 1 tablet, take 1 tablet a day until resolved (or 10 days maximum) 30 tablet 5  . losartan (COZAAR) 100 MG tablet Take 1 tablet (100 mg total) by mouth daily. 90 tablet 3  . Multiple Vitamin (MULTIVITAMIN) tablet Take 1 tablet by mouth. Once or twice a week    . omeprazole (PRILOSEC) 40 MG capsule TAKE 1 CAPSULE BY MOUTH EVERY MORNING. 100 capsule 3  . sildenafil (VIAGRA) 100 MG tablet Take 0.5-1 tablets (50-100 mg total) by mouth daily as needed for erectile dysfunction. 10 tablet 11   No current facility-administered medications for this visit.     Objective: BP 138/84   Pulse 77   Temp 98.8 F (37.1 C) (Oral)   Ht 6' (1.829 m)   Wt (!) 301 lb (136.5 kg)   SpO2 95%   BMI 40.82  kg/m  Gen: NAD, resting comfortably CV: RRR no murmurs rubs or gallops Lungs: CTAB no crackles, wheeze, rhonchi Abdomen: soft/nontender/nondistended/normal bowel sounds. No rebound or guarding.  Ext: 1+ pitting edema, 10 cm below tibial plateau equal calf size and no pain with palpation Skin: warm, dry, no rash  Assessment/Plan:  Essential hypertension Edema S: controlled on losartan 100mg . He presented with acute visit due to ankle swelling bilateral but R>L. He states this started 1 month ago. BP at home in 120 or 130s and diastolic always controlled BP Readings from Last 3 Encounters:  06/03/16 138/84  04/20/16 118/82  03/18/16 124/82  A/P:Continue current meds:  Losartan alone. Well controlled at home. Controlled per jnc8 here as well but would love for it to be 120s or 130s- he is to work on weight loss though weight up 2 lbs over holidays  In regards to edema- I pointed out he stopped hctz at that point and it made much more sens  has march follow up. Thankfully no gout flares since starting allopurinol and without hctz at this point.   Some R knee pain (history bilateral knee pain)- to follow up with ortho. No obvious bakers cyst on R- doubt rupture causing ankle edema.   Return precautions advised.  Tana ConchStephen Seith Aikey, MD

## 2016-06-03 NOTE — Assessment & Plan Note (Signed)
Edema S: controlled on losartan 100mg . He presented with acute visit due to ankle swelling bilateral but R>L. He states this started 1 month ago. BP at home in 120 or 130s and diastolic always controlled BP Readings from Last 3 Encounters:  06/03/16 138/84  04/20/16 118/82  03/18/16 124/82  A/P:Continue current meds:  Losartan alone. Well controlled at home. Controlled per jnc8 here as well but would love for it to be 120s or 130s- he is to work on weight loss though weight up 2 lbs over holidays  In regards to edema- I pointed out he stopped hctz at that point and it made much more sens

## 2016-06-03 NOTE — Patient Instructions (Addendum)
You have some swelling in both legs- likely from stopping the diuretic/hctz  Slightly worse on right side but no signs of blood clot. If the right leg were to get bigger or you had pain in that calf- return to see us  Blood pressure is slightly high but reasonable- let's continue to work on weight loss. We will consider additional medicine if regularly above 140/90 but hopefully will not need that

## 2016-06-28 ENCOUNTER — Ambulatory Visit (INDEPENDENT_AMBULATORY_CARE_PROVIDER_SITE_OTHER): Payer: BLUE CROSS/BLUE SHIELD | Admitting: Family Medicine

## 2016-06-28 ENCOUNTER — Encounter: Payer: Self-pay | Admitting: Family Medicine

## 2016-06-28 VITALS — BP 156/88 | HR 74 | Temp 98.6°F | Ht 72.0 in | Wt 307.0 lb

## 2016-06-28 DIAGNOSIS — I10 Essential (primary) hypertension: Secondary | ICD-10-CM | POA: Diagnosis not present

## 2016-06-28 DIAGNOSIS — M1A00X0 Idiopathic chronic gout, unspecified site, without tophus (tophi): Secondary | ICD-10-CM

## 2016-06-28 MED ORDER — COLCHICINE 0.6 MG PO TABS: ORAL_TABLET | ORAL | 3 refills | Status: DC

## 2016-06-28 MED ORDER — OMEPRAZOLE 40 MG PO CPDR: DELAYED_RELEASE_CAPSULE | ORAL | 3 refills | Status: DC

## 2016-06-28 MED ORDER — ALLOPURINOL 300 MG PO TABS: 300.0000 mg | ORAL_TABLET | Freq: Every day | ORAL | 3 refills | Status: DC

## 2016-06-28 MED ORDER — LOSARTAN POTASSIUM 100 MG PO TABS: 100.0000 mg | ORAL_TABLET | Freq: Every day | ORAL | 3 refills | Status: DC

## 2016-06-28 NOTE — Progress Notes (Signed)
Subjective:  Ross [name] is a 61 y.o. year old very pleasant male patient who presents for/with See problem oriented charting ROS- left ankle pain, no fever or chills. Does have warmth and swelling near left ankle   Past Medical History-  Patient Active Problem List   Diagnosis Date Noted  . Elevated blood sugar 08/10/2015    Priority: Medium  . BPH associated with nocturia 01/12/2015    Priority: Medium  . Essential hypertension 11/27/2009    Priority: Medium  . Obstructive sleep apnea 10/29/2009    Priority: Medium  . Gout 01/25/2007    Priority: Medium  . Back pain with right-sided sciatica 09/02/2013    Priority: Low  . GERD (gastroesophageal reflux disease) 02/18/2011    Priority: Low  . Morbid obesity (HCC) 04/02/2009    Priority: Low  . ERECTILE DYSFUNCTION, ORGANIC 01/25/2007    Priority: Low  . Diarrhea 01/27/2015    Medications- reviewed and updated Current Outpatient Prescriptions  Medication Sig Dispense Refill  . allopurinol (ZYLOPRIM) 300 MG tablet Take 1 tablet (300 mg total) by mouth daily. 90 tablet 3  . aspirin 81 MG tablet Take 81 mg by mouth daily.      . colchicine 0.6 MG tablet Take 2 tablets at first sign of gout flare then repeat in 2 hours 1 tablet, take 1 tablet a day until resolved (or 10 days maximum). 90 tablet 3  . losartan (COZAAR) 100 MG tablet Take 1 tablet (100 mg total) by mouth daily. 90 tablet 3  . Multiple Vitamin (MULTIVITAMIN) tablet Take 1 tablet by mouth. Once or twice a week    . omeprazole (PRILOSEC) 40 MG capsule TAKE 1 CAPSULE BY MOUTH EVERY MORNING. 100 capsule 3  . sildenafil (VIAGRA) 100 MG tablet Take 0.5-1 tablets (50-100 mg total) by mouth daily as needed for erectile dysfunction. 10 tablet 11   No current facility-administered medications for this visit.     Objective: BP (!) 156/88 (BP Location: Left Arm, Patient Position: Sitting, Cuff Size: Large)   Pulse 74   Temp 98.6 F (37 C) (Oral)   Ht 6' (1.829 m)   Wt  (!) 307 lb (139.3 kg)   SpO2 94%   BMI 41.64 kg/m  Gen: NAD, resting comfortably CV: RRR no murmurs rubs or gallops Lungs: CTAB no crackles, wheeze, rhonchi Abdomen: obese  Ext: trace edema stable Below left ankle very tender, swollen and with warmth and mild erythema Skin: warm, dry, no rash other than erythema noted above  Assessment/Plan:   Essential hypertension S: controlled on losartan 100mg  alone last visit per JNC 8.  Home #s have been 120s or 130s over high 80s BP Readings from Last 3 Encounters:  06/28/16 (!) 156/88  06/03/16 138/84  04/20/16 118/82  A/P:Continue current meds:  Pain likely has increased BP today- suspect to improve with gout treatment.    Gout S: a few weeks ago saw orthopedics and thought potential gout in knee. Given injection helped tremendously. Over last few days left ankle has been hurting. Severe pain associated with some redness and swelling below left lateral ankle yesterday. Took two colchicine and improving. 1 today and going in right direction but still not resolved. Compliant with 100mg  allopurinol A/P: he would like to go back to 300 mg allopurinol given concern of uncontrolled gout- we will make this change nad check uric acid later date instead of waiting until flare over, checking uric acid, then adjusting. Did encourage him to wait until current  flare calmed down slightly then do 3 weeks of colchicine daily. He loses insurance coverage through work in 3 weeks- going on cobra- wants to push next visit into April for CPE and agreed that is fine. He asks for 90 days of meds as result and agreed to this.    Late April planned  Meds ordered this encounter  Medications  . allopurinol (ZYLOPRIM) 300 MG tablet    Sig: Take 1 tablet (300 mg total) by mouth daily.    Dispense:  90 tablet    Refill:  3  . colchicine 0.6 MG tablet    Sig: Take 2 tablets at first sign of gout flare then repeat in 2 hours 1 tablet, take 1 tablet a day until  resolved (or 10 days maximum).    Dispense:  90 tablet    Refill:  3  . losartan (COZAAR) 100 MG tablet    Sig: Take 1 tablet (100 mg total) by mouth daily.    Dispense:  90 tablet    Refill:  3  . omeprazole (PRILOSEC) 40 MG capsule    Sig: TAKE 1 CAPSULE BY MOUTH EVERY MORNING.    Dispense:  100 capsule    Refill:  3   Return precautions advised.  Tana Conch, MD

## 2016-06-28 NOTE — Patient Instructions (Addendum)
Increase allopurinol to 300mg  once current gout flare has calmed down. Also take colchicine daily for 3 weeks with start of increase in medication.   Refilled 90 days of meds except for ED  Blood pressure up- should come back down when pain improves

## 2016-06-28 NOTE — Assessment & Plan Note (Signed)
S: a few weeks ago saw orthopedics and thought potential gout in knee. Given injection helped tremendously. Over last few days left ankle has been hurting. Severe pain associated with some redness and swelling below left lateral ankle yesterday. Took two colchicine and improving. 1 today and going in right direction but still not resolved. Compliant with 100mg  allopurinol A/P: he would like to go back to 300 mg allopurinol given concern of uncontrolled gout- we will make this change nad check uric acid later date instead of waiting until flare over, checking uric acid, then adjusting. Did encourage him to wait until current flare calmed down slightly then do 3 weeks of colchicine daily. He loses insurance coverage through work in 3 weeks- going on cobra- wants to push next visit into April for CPE and agreed that is fine. He asks for 90 days of meds as result and agreed to this.

## 2016-06-28 NOTE — Assessment & Plan Note (Signed)
S: controlled on losartan 100mg  alone last visit per JNC 8.  Home #s have been 120s or 130s over high 80s BP Readings from Last 3 Encounters:  06/28/16 (!) 156/88  06/03/16 138/84  04/20/16 118/82  A/P:Continue current meds:  Pain likely has increased BP today- suspect to improve with gout treatment.

## 2016-06-28 NOTE — Progress Notes (Signed)
Pre visit review using our clinic review tool, if applicable. No additional management support is needed unless otherwise documented below in the visit note. 

## 2016-07-11 ENCOUNTER — Encounter (HOSPITAL_BASED_OUTPATIENT_CLINIC_OR_DEPARTMENT_OTHER): Payer: Self-pay | Admitting: Emergency Medicine

## 2016-07-11 ENCOUNTER — Emergency Department (HOSPITAL_BASED_OUTPATIENT_CLINIC_OR_DEPARTMENT_OTHER)
Admission: EM | Admit: 2016-07-11 | Discharge: 2016-07-11 | Disposition: A | Discharge status: Home / Self Care | Payer: BLUE CROSS/BLUE SHIELD | Attending: Emergency Medicine | Admitting: Emergency Medicine

## 2016-07-11 DIAGNOSIS — Z79899 Other long term (current) drug therapy: Secondary | ICD-10-CM | POA: Insufficient documentation

## 2016-07-11 DIAGNOSIS — Z7982 Long term (current) use of aspirin: Secondary | ICD-10-CM | POA: Diagnosis not present

## 2016-07-11 DIAGNOSIS — Z87891 Personal history of nicotine dependence: Secondary | ICD-10-CM | POA: Diagnosis not present

## 2016-07-11 DIAGNOSIS — I159 Secondary hypertension, unspecified: Secondary | ICD-10-CM

## 2016-07-11 DIAGNOSIS — R51 Headache: Secondary | ICD-10-CM | POA: Diagnosis present

## 2016-07-11 NOTE — ED Provider Notes (Signed)
[facility] Note   CSN: [id number] Arrival date & time: 07/11/16  1939  By signing my name below, I, Linna Darner, attest that this documentation has been prepared under the direction and in the presence of physician practitioner, Marily Memos, MD. Electronically Signed: Linna Darner, Scribe. 07/11/2016. 9:46 PM.  History   Chief Complaint Chief Complaint  Patient presents with  . Hypertension    The history is provided by the patient. No language interpreter was used.     HPI Comments: Ross [name] is a 61 y.o. male with PMHx including HTN and stroke who presents to the Emergency Department complaining of consistently elevated BP for about 2 weeks. He visited his PCP on 2/13 and his BP was measured at 156/88; he has been measuring it at home since the appointment and states the readings are typically in the range of 160/90. Pt uses losartan for his HTN and denies any recent missed doses or dosage changes. Pt reports some muscle tightness in his neck, a mild headache, and tinnitus over the last several days which he attributes to his elevated BP. Pt notes he has been under a lot of stress since his wife passed away on Apr 21, 2016. He reports he was initially diagnosed with HTN after a stroke which involved a ruptured blood vessel behind his right eye. Pt is anticoagulated with a daily baby aspirin. He denies back pain, difficulty urinating, SOB, chest pain, leg swelling, abdominal distention, vision changes, or any other associated symptoms. He has a follow-up appointment with his PCP for his elevated BP tomorrow.  Past Medical History:  Diagnosis Date  . Anal fissure   . Arthritis   . DDD (degenerative disc disease)   . Family history of malignant neoplasm of gastrointestinal tract    mother  . Fatty liver   . GERD (gastroesophageal reflux disease)   . Glaucoma   . Gout   . Hemorrhoids   . Hypertension   . Rectal bleeding   . Sleep apnea   . Stroke [facility])      in right eye    Patient Active Problem List   Diagnosis Date Noted  . Elevated blood sugar 08/10/2015  . Diarrhea 01/27/2015  . BPH associated with nocturia 01/12/2015  . Back pain with right-sided sciatica 09/02/2013  . GERD (gastroesophageal reflux disease) 02/18/2011  . Essential hypertension 11/27/2009  . Obstructive sleep apnea 10/29/2009  . Morbid obesity (HCC) 04/02/2009  . Gout 01/25/2007  . ERECTILE DYSFUNCTION, ORGANIC 01/25/2007    Past Surgical History:  Procedure Laterality Date  . EYE SURGERY Right    laser   . GLAUCOMA SURGERY Bilateral   . LUMBAR DISC SURGERY     L5  . PYLOROPLASTY     pyloric stenosis, as infant  . TONSILLECTOMY         Home Medications    Prior to Admission medications   Medication Sig Start Date End Date Taking? Authorizing Provider  allopurinol (ZYLOPRIM) 300 MG tablet Take 1 tablet (300 mg total) by mouth daily. 06/28/16   Shelva Majestic, MD  aspirin 81 MG tablet Take 81 mg by mouth daily.      Historical Provider, MD  colchicine 0.6 MG tablet Take 2 tablets at first sign of gout flare then repeat in 2 hours 1 tablet, take 1 tablet a day until resolved (or 10 days maximum). 06/28/16   Shelva Majestic, MD  losartan (COZAAR) 100 MG tablet Take 1 tablet (100 mg total) by  mouth daily. 06/28/16   Shelva MajesticStephen O Hunter, MD  Multiple Vitamin (MULTIVITAMIN) tablet Take 1 tablet by mouth. Once or twice a week    Historical Provider, MD  omeprazole (PRILOSEC) 40 MG capsule TAKE 1 CAPSULE BY MOUTH EVERY MORNING. 06/28/16   Shelva MajesticStephen O Hunter, MD  sildenafil (VIAGRA) 100 MG tablet Take 0.5-1 tablets (50-100 mg total) by mouth daily as needed for erectile dysfunction. 02/08/16   Shelva MajesticStephen O Hunter, MD    Family History Family History  Problem Relation Age of Onset  . Colon cancer Mother   . Breast cancer Mother   . Prostate cancer Father   . Testicular cancer Father   . Stomach cancer Paternal Grandmother   . Diabetes Maternal Grandmother   .  Heart disease Maternal Grandmother   . Heart disease Maternal Grandfather     Social History Social History  Substance Use Topics  . Smoking status: Former Smoker    Packs/day: 1.00    Years: 20.00    Types: Cigarettes    Quit date: 05/17/1991  . Smokeless tobacco: Never Used  . Alcohol use 1.0 oz/week    2 Standard drinks or equivalent per week     Comment: Social use      Allergies   Patient has no known allergies.   Review of Systems Review of Systems  HENT: Positive for tinnitus.   Eyes: Negative for visual disturbance.  Respiratory: Negative for shortness of breath.   Cardiovascular: Negative for chest pain and leg swelling.  Gastrointestinal: Negative for abdominal distention.  Genitourinary: Negative for difficulty urinating.  Musculoskeletal: Positive for neck pain. Negative for back pain.  Neurological: Positive for headaches.  Hematological: Bruises/bleeds easily.  All other systems reviewed and are negative.    Physical Exam Updated Vital Signs BP 160/89   Pulse 61   Temp 98.2 F (36.8 C) (Oral)   Resp 18   Ht 6' (1.829 m)   Wt (!) 307 lb (139.3 kg)   SpO2 94%   BMI 41.64 kg/m   Physical Exam  Constitutional: He is oriented to person, place, and time. He appears well-developed and well-nourished. No distress.  HENT:  Head: Normocephalic and atraumatic.  Eyes: Conjunctivae and EOM are normal.  No retinal hemorrhages.  Neck: Neck supple. No tracheal deviation present.  Cardiovascular: Normal rate.   Pulmonary/Chest: Effort normal. No respiratory distress.  No crackles.  Abdominal: Soft. Bowel sounds are normal. He exhibits no distension. There is no tenderness. There is no rebound, no guarding and no CVA tenderness.  Musculoskeletal: Normal range of motion.  Neurological: He is alert and oriented to person, place, and time.  CN 3-12 intact. Sensation to upper and lower extremities intact. Normal finger-to-nose.  Skin: Skin is warm and dry.    Psychiatric: He has a normal mood and affect. His behavior is normal.  Nursing note and vitals reviewed.    ED Treatments / Results  Labs (all labs ordered are listed, but only abnormal results are displayed) Labs Reviewed - No data to display  EKG  EKG Interpretation None       Radiology No results found.  Procedures Procedures (including critical care time)  DIAGNOSTIC STUDIES: Oxygen Saturation is 100% on RA, normal by my interpretation.    COORDINATION OF CARE: 9:59 PM Discussed treatment plan with pt at bedside and pt agreed to plan.  Medications Ordered in ED Medications - No data to display   Initial Impression / Assessment and Plan / ED Course  I  have reviewed the triage vital signs and the nursing notes.  Pertinent labs & imaging results that were available during my care of the patient were reviewed by me and considered in my medical decision making (see chart for details).     This is a 61 yo M who presents with asymptomatic hypertension. They have had blood pressures as high as 172 systolic. Not had any headache, chest pain, shortness of breath, abdominal pain, back pain lower extremity edema or changes in urinary frequency beyond normal. Has been taking blood pressure medications as directed by the primary doctor. Exam does not show any evidence of neurologic changes, fluid overload, vision changes or other acute issues secondary to hypertension. As ACEP guidelines recommend not acutely lowering BP and having close PCP follow-up for asymptomatic hypertension I suggested that they keep a log of their blood pressures for the next 5-7 days and follow-up with her primary doctor for any medication changes. They know to return to the emergency department for any onset of the symptoms as listed above.   Final Clinical Impressions(s) / ED Diagnoses   Final diagnoses:  Secondary hypertension   I personally performed the services described in this documentation,  which was scribed in my presence. The recorded information has been reviewed and is accurate.   Marily Memos, MD 07/11/16 [phone]

## 2016-07-11 NOTE — ED Notes (Signed)
ED Provider at bedside. 

## 2016-07-11 NOTE — ED Triage Notes (Signed)
Patient states that he has a Headache and stiff neck x 2 -3 days that he reports is due to "high blood pressure" - Patient has several reading.

## 2016-07-12 ENCOUNTER — Encounter: Payer: Self-pay | Admitting: Family Medicine

## 2016-07-12 ENCOUNTER — Ambulatory Visit (INDEPENDENT_AMBULATORY_CARE_PROVIDER_SITE_OTHER): Payer: BLUE CROSS/BLUE SHIELD | Admitting: Family Medicine

## 2016-07-12 DIAGNOSIS — R053 Chronic cough: Secondary | ICD-10-CM

## 2016-07-12 DIAGNOSIS — M1A00X0 Idiopathic chronic gout, unspecified site, without tophus (tophi): Secondary | ICD-10-CM

## 2016-07-12 DIAGNOSIS — I10 Essential (primary) hypertension: Secondary | ICD-10-CM

## 2016-07-12 DIAGNOSIS — R05 Cough: Secondary | ICD-10-CM

## 2016-07-12 MED ORDER — LOSARTAN POTASSIUM 100 MG PO TABS: 100.0000 mg | ORAL_TABLET | Freq: Every day | ORAL | 3 refills | Status: DC

## 2016-07-12 MED ORDER — LOSARTAN POTASSIUM-HCTZ 100-12.5 MG PO TABS: 1.0000 | ORAL_TABLET | Freq: Every day | ORAL | 5 refills | Status: DC

## 2016-07-12 MED ORDER — HYDROCHLOROTHIAZIDE 12.5 MG PO CAPS: 12.5000 mg | ORAL_CAPSULE | Freq: Every day | ORAL | 5 refills | Status: DC

## 2016-07-12 NOTE — Progress Notes (Signed)
Pre visit review using our clinic review tool, if applicable. No additional management support is needed unless otherwise documented below in the visit note. 

## 2016-07-12 NOTE — Patient Instructions (Addendum)
Now that you are on allopurinol 200mg   Lets see if we can add the HCTZ back in. I sent in your combo losartan-hctz 100-12.5mg  to pharmacy  Lets recheck in 2 weeks or so.   Great job with making the effort on weight loss

## 2016-07-12 NOTE — Progress Notes (Signed)
Subjective:  Ross [name] is a 61 y.o. year old very pleasant male patient who presents for/with See problem oriented charting ROS- no hot or swollen joints. No chest pain or shortness of breath. No headache or blurry vision.   Past Medical History-  Patient Active Problem List   Diagnosis Date Noted  . Elevated blood sugar 08/10/2015    Priority: Medium  . BPH associated with nocturia 01/12/2015    Priority: Medium  . Essential hypertension 11/27/2009    Priority: Medium  . Obstructive sleep apnea 10/29/2009    Priority: Medium  . Gout 01/25/2007    Priority: Medium  . Back pain with right-sided sciatica 09/02/2013    Priority: Low  . GERD (gastroesophageal reflux disease) 02/18/2011    Priority: Low  . Morbid obesity (HCC) 04/02/2009    Priority: Low  . ERECTILE DYSFUNCTION, ORGANIC 01/25/2007    Priority: Low  . Diarrhea 01/27/2015    Medications- reviewed and updated Current Outpatient Prescriptions  Medication Sig Dispense Refill  . allopurinol (ZYLOPRIM) 300 MG tablet Take 1 tablet (300 mg total) by mouth daily. 90 tablet 3  . aspirin 81 MG tablet Take 81 mg by mouth daily.      . colchicine 0.6 MG tablet Take 2 tablets at first sign of gout flare then repeat in 2 hours 1 tablet, take 1 tablet a day until resolved (or 10 days maximum). 90 tablet 3  . Multiple Vitamin (MULTIVITAMIN) tablet Take 1 tablet by mouth. Once or twice a week    . omeprazole (PRILOSEC) 40 MG capsule TAKE 1 CAPSULE BY MOUTH EVERY MORNING. 100 capsule 3  . sildenafil (VIAGRA) 100 MG tablet Take 0.5-1 tablets (50-100 mg total) by mouth daily as needed for erectile dysfunction. 10 tablet 11  . losartan-hydrochlorothiazide (HYZAAR) 100-12.5 MG tablet Take 1 tablet by mouth daily. 30 tablet 5   No current facility-administered medications for this visit.     Objective: BP (!) 144/92 (BP Location: Left Arm, Patient Position: Sitting, Cuff Size: Large)   Pulse 82   Temp 98.4 F (36.9 C) (Oral)    Ht 6' (1.829 m)   Wt (!) 300 lb 6.4 oz (136.3 kg)   SpO2 96%   BMI 40.74 kg/m  Gen: NAD, resting comfortably CV: RRR no murmurs rubs or gallops Lungs: CTAB no crackles, wheeze, rhonchi Ext: 1+ edema in ankle appears stable.  Skin: warm, dry  Assessment/Plan:  Essential hypertension S: controlled poorly on losartan 100mg  alone.  Went to ED yesterday and BP even more elevated as noted below. We had stopped hctz due to gout flare. Had increase in edema without this. Home readings range from 148-180 systolic over 90-103 with 8 measurements broguht   Started walking on Friday. Finished dealing with wife's estate finally. Should help with stress. Has been eating a lot of frozen meals and high salt content- looking to cut this down.   BP Readings from Last 3 Encounters:  07/12/16 (!) 144/92  07/11/16 160/89  06/28/16 (!) 156/88  A/P:Continue current meds:  But add hctz back in. Some risk of gout flare but on other hand with his edema prefers to avoid amlodipine (and also likely lower risk of gout in long run as tittrating allopurinol back up to 300mg ) -losartan HCT 100-12.5mg  reordered. Stop losartan alone.   Gout S: is taking 2 of his allopurinol 100mg  until runs out then will take 300mg . Has not had darkened stool or upset stomach as he has in the past. No  gout flares yet A/P: think his plan is reasonable- discussed with change in dose risk for gout flares.   2 weeks  Chronic cough pulled in from link to prior chronic cough- will try to remove association in future Meds ordered this encounter  Medications  . DISCONTD: losartan (COZAAR) 100 MG tablet    Sig: Take 1 tablet (100 mg total) by mouth daily.    Dispense:  90 tablet    Refill:  3  . DISCONTD: hydrochlorothiazide (MICROZIDE) 12.5 MG capsule    Sig: Take 1 capsule (12.5 mg total) by mouth daily.    Dispense:  30 capsule    Refill:  5  . losartan-hydrochlorothiazide (HYZAAR) 100-12.5 MG tablet    Sig: Take 1 tablet by  mouth daily.    Dispense:  30 tablet    Refill:  5    Return precautions advised.  Tana Conch, MD

## 2016-07-13 NOTE — Assessment & Plan Note (Signed)
S: controlled poorly on losartan 100mg  alone. Went to ED yesterday and BP even more elevated as noted below. We had stopped hctz due to gout flare. Had increase in edema without this. Home readings range from 148-180 systolic over 90-103 with 8 measurements broguht   Started walking on Friday. Finished dealing with wife's estate finally. Should help with stress. Has been eating a lot of frozen meals and high salt content- looking to cut this down.   BP Readings from Last 3 Encounters:  07/12/16 (!) 144/92  07/11/16 160/89  06/28/16 (!) 156/88  A/P:Continue current meds:  But add hctz back in. Some risk of gout flare but on other hand with his edema prefers to avoid amlodipine (and also likely lower risk of gout in long run as tittrating allopurinol back up to 300mg ) -losartan HCT 100-12.5mg  reordered. Stop losartan alone.

## 2016-07-13 NOTE — Assessment & Plan Note (Signed)
S: is taking 2 of his allopurinol 100mg  until runs out then will take 300mg . Has not had darkened stool or upset stomach as he has in the past. No gout flares yet A/P: think his plan is reasonable- discussed with change in dose risk for gout flares.

## 2016-07-28 ENCOUNTER — Ambulatory Visit (INDEPENDENT_AMBULATORY_CARE_PROVIDER_SITE_OTHER): Payer: BLUE CROSS/BLUE SHIELD | Admitting: Family Medicine

## 2016-07-28 ENCOUNTER — Encounter: Payer: Self-pay | Admitting: Family Medicine

## 2016-07-28 DIAGNOSIS — M1A00X0 Idiopathic chronic gout, unspecified site, without tophus (tophi): Secondary | ICD-10-CM

## 2016-07-28 DIAGNOSIS — I10 Essential (primary) hypertension: Secondary | ICD-10-CM

## 2016-07-28 NOTE — Progress Notes (Signed)
Subjective:  Ross [name] is a 61 y.o. year old very pleasant male patient who presents for/with See problem oriented charting ROS- edema better, twinge of ankle pain for 2 days but no clear gout flare, No chest pain or shortness of breath. No headache or blurry vision.    Past Medical History-  Patient Active Problem List   Diagnosis Date Noted  . Elevated blood sugar 08/10/2015    Priority: Medium  . BPH associated with nocturia 01/12/2015    Priority: Medium  . Essential hypertension 11/27/2009    Priority: Medium  . Obstructive sleep apnea 10/29/2009    Priority: Medium  . Gout 01/25/2007    Priority: Medium  . Back pain with right-sided sciatica 09/02/2013    Priority: Low  . GERD (gastroesophageal reflux disease) 02/18/2011    Priority: Low  . Morbid obesity (HCC) 04/02/2009    Priority: Low  . ERECTILE DYSFUNCTION, ORGANIC 01/25/2007    Priority: Low  . Diarrhea 01/27/2015    Medications- reviewed and updated Current Outpatient Prescriptions  Medication Sig Dispense Refill  . allopurinol (ZYLOPRIM) 300 MG tablet Take 1 tablet (300 mg total) by mouth daily. 90 tablet 3  . aspirin 81 MG tablet Take 81 mg by mouth daily.      . colchicine 0.6 MG tablet Take 2 tablets at first sign of gout flare then repeat in 2 hours 1 tablet, take 1 tablet a day until resolved (or 10 days maximum). 90 tablet 3  . losartan-hydrochlorothiazide (HYZAAR) 100-12.5 MG tablet Take 1 tablet by mouth daily. 30 tablet 5  . Multiple Vitamin (MULTIVITAMIN) tablet Take 1 tablet by mouth. Once or twice a week    . omeprazole (PRILOSEC) 40 MG capsule TAKE 1 CAPSULE BY MOUTH EVERY MORNING. 100 capsule 3  . sildenafil (VIAGRA) 100 MG tablet Take 0.5-1 tablets (50-100 mg total) by mouth daily as needed for erectile dysfunction. 10 tablet 11   No current facility-administered medications for this visit.     Objective: BP 130/84 (BP Location: Left Arm, Patient Position: Sitting, Cuff Size: Large)    Pulse 73   Temp 98.7 F (37.1 C) (Oral)   Ht 6' (1.829 m)   Wt (!) 303 lb 6.4 oz (137.6 kg)   SpO2 92%   BMI 41.15 kg/m  Gen: NAD, resting comfortably CV: RRR no murmurs rubs or gallops Lungs: CTAB no crackles, wheeze, rhonchi Abdomen: soft/nontender/nondistended/normal bowel sounds. obese Ext: no edema Skin: warm, dry  Assessment/Plan:  Essential hypertension S: controlled on losartan-hct 100-12.5mg  (had previously stopped hct due to gout history). Swelling went back down in ankles   Home readings: mainly in 120s and 130s. Did have a spike on a night he ate [place] food.  BP Readings from Last 3 Encounters:  07/28/16 130/84  07/12/16 (!) 144/92  07/11/16 160/89  A/P:Continue current meds:  Much better and no gout flares- will just try to control gout by pushing uric acid lower with allopurinol as VERY responsive to the diuretic.   Doing well with cutting to water and increasing walking and increasing veggies  Gout S: compliant with 2 of his allopurinol 100mg  tablets and no Se and no gout flares. No darkened stools which has occurred in the past A/P: doing very well- changes to 300 mg in 2 days. Declines prophlylactic colchicine as can upset stomach.   Morbid obesity (HCC) S: walking more, more water, more veggies. Did have a large set back with large [place] dinner last saturday Wt Readings from  Last 3 Encounters:  07/28/16 (!) 303 lb 6.4 oz (137.6 kg)  07/12/16 (!) 300 lb 6.4 oz (136.3 kg)  07/11/16 (!) 307 lb (139.3 kg)  A/P: discussed striving for at least 10 lbs off- making osme good changes but needs to watch the splurges. Encouraged need for healthy eating, regular exercise, weight loss.    Return in about 6 months (around 01/28/2017) for follow up- or sooner if needed.  Return precautions advised.  Tana Conch, MD

## 2016-07-28 NOTE — Progress Notes (Signed)
Pre visit review using our clinic review tool, if applicable. No additional management support is needed unless otherwise documented below in the visit note. 

## 2016-07-28 NOTE — Assessment & Plan Note (Signed)
S: walking more, more water, more veggies. Did have a large set back with large [place] dinner last saturday Wt Readings from Last 3 Encounters:  07/28/16 (!) 303 lb 6.4 oz (137.6 kg)  07/12/16 (!) 300 lb 6.4 oz (136.3 kg)  07/11/16 (!) 307 lb (139.3 kg)  A/P: discussed striving for at least 10 lbs off- making osme good changes but needs to watch the splurges. Encouraged need for healthy eating, regular exercise, weight loss.

## 2016-07-28 NOTE — Assessment & Plan Note (Signed)
S: compliant with 2 of his allopurinol 100mg  tablets and no Se and no gout flares. No darkened stools which has occurred in the past A/P: doing very well- changes to 300 mg in 2 days. Declines prophlylactic colchicine as can upset stomach.

## 2016-07-28 NOTE — Assessment & Plan Note (Signed)
S: controlled on losartan-hct 100-12.5mg  (had previously stopped hct due to gout history). Swelling went back down in ankles   Home readings: mainly in 120s and 130s. Did have a spike on a night he ate [place] food.  BP Readings from Last 3 Encounters:  07/28/16 130/84  07/12/16 (!) 144/92  07/11/16 160/89  A/P:Continue current meds:  Much better and no gout flares- will just try to control gout by pushing uric acid lower with allopurinol as VERY responsive to the diuretic.   Doing well with cutting to water and increasing walking and increasing veggies

## 2016-07-28 NOTE — Patient Instructions (Signed)
Things looking better- no changes today

## 2016-08-01 ENCOUNTER — Other Ambulatory Visit: Payer: BLUE CROSS/BLUE SHIELD

## 2016-08-10 ENCOUNTER — Encounter: Payer: BLUE CROSS/BLUE SHIELD | Admitting: Family Medicine

## 2016-08-31 ENCOUNTER — Other Ambulatory Visit: Payer: BLUE CROSS/BLUE SHIELD

## 2016-09-02 ENCOUNTER — Other Ambulatory Visit: Payer: Self-pay

## 2016-09-02 DIAGNOSIS — R053 Chronic cough: Secondary | ICD-10-CM

## 2016-09-02 DIAGNOSIS — R05 Cough: Secondary | ICD-10-CM

## 2016-09-02 MED ORDER — LOSARTAN POTASSIUM-HCTZ 100-12.5 MG PO TABS: 1.0000 | ORAL_TABLET | Freq: Every day | ORAL | 1 refills | Status: DC

## 2016-09-05 ENCOUNTER — Encounter: Payer: BLUE CROSS/BLUE SHIELD | Admitting: Family Medicine

## 2016-09-12 ENCOUNTER — Encounter: Payer: Self-pay | Admitting: Family Medicine

## 2016-09-12 ENCOUNTER — Ambulatory Visit (INDEPENDENT_AMBULATORY_CARE_PROVIDER_SITE_OTHER): Payer: BLUE CROSS/BLUE SHIELD | Admitting: Family Medicine

## 2016-09-12 VITALS — BP 140/80 | HR 86 | Temp 98.7°F | Ht 72.0 in | Wt 314.6 lb

## 2016-09-12 DIAGNOSIS — M4692 Unspecified inflammatory spondylopathy, cervical region: Secondary | ICD-10-CM

## 2016-09-12 DIAGNOSIS — M5412 Radiculopathy, cervical region: Secondary | ICD-10-CM

## 2016-09-12 DIAGNOSIS — M47812 Spondylosis without myelopathy or radiculopathy, cervical region: Secondary | ICD-10-CM

## 2016-09-12 MED ORDER — PREDNISONE 20 MG PO TABS: ORAL_TABLET | ORAL | 0 refills | Status: DC

## 2016-09-12 NOTE — Progress Notes (Signed)
Subjective:  Ross [name] is a 61 y.o. year old very pleasant male patient who presents for/with See problem oriented charting ROS- no chest pain or shortness of breath. No left arm or neck pain. No diaphoresis.    Past Medical History-  Patient Active Problem List   Diagnosis Date Noted  . Elevated blood sugar 08/10/2015    Priority: Medium  . BPH associated with nocturia 01/12/2015    Priority: Medium  . Essential hypertension 11/27/2009    Priority: Medium  . Obstructive sleep apnea 10/29/2009    Priority: Medium  . Gout 01/25/2007    Priority: Medium  . Back pain with right-sided sciatica 09/02/2013    Priority: Low  . GERD (gastroesophageal reflux disease) 02/18/2011    Priority: Low  . Morbid obesity (HCC) 04/02/2009    Priority: Low  . ERECTILE DYSFUNCTION, ORGANIC 01/25/2007    Priority: Low  . Diarrhea 01/27/2015    Medications- reviewed and updated Current Outpatient Prescriptions  Medication Sig Dispense Refill  . allopurinol (ZYLOPRIM) 300 MG tablet Take 1 tablet (300 mg total) by mouth daily. 90 tablet 3  . aspirin 81 MG tablet Take 81 mg by mouth daily.      . colchicine 0.6 MG tablet Take 2 tablets at first sign of gout flare then repeat in 2 hours 1 tablet, take 1 tablet a day until resolved (or 10 days maximum). 90 tablet 3  . losartan-hydrochlorothiazide (HYZAAR) 100-12.5 MG tablet Take 1 tablet by mouth daily. 90 tablet 1  . Multiple Vitamin (MULTIVITAMIN) tablet Take 1 tablet by mouth. Once or twice a week    . omeprazole (PRILOSEC) 40 MG capsule TAKE 1 CAPSULE BY MOUTH EVERY MORNING. 100 capsule 3  . sildenafil (VIAGRA) 100 MG tablet Take 0.5-1 tablets (50-100 mg total) by mouth daily as needed for erectile dysfunction. 10 tablet 11   No current facility-administered medications for this visit.     Objective: BP 140/80 (BP Location: Left Arm, Patient Position: Sitting, Cuff Size: Large)   Pulse 86   Temp 98.7 F (37.1 C) (Oral)   Ht 6' (1.829 m)    Wt (!) 314 lb 9.6 oz (142.7 kg)   SpO2 97%   BMI 42.67 kg/m  Gen: NAD, resting comfortably CV: RRR no murmurs rubs or gallops Lungs: CTAB no crackles, wheeze, rhonchi Ext: no edema MSK: patient with tension in right upper neck and into shoulder with spasm of several muscles noted. After spurlings notes tingling into right neck and arm.  Neuro: 5/5 strength upper extremities, intact to gross touch  Assessment/Plan:  Cervical arthritis (HCC)  Cervical radiculopathy S: right arm and neck pain for 1 month. Starts in the neck but can go down to the forearm. Numbness/tingling feeling in the neck down to the hand at times. 5/10 pain for the most part.  Cervical arthritis noted back to 2010 in imaging. advil helps only slightly A/P: Positive spurling I suspect Ross [name]'s pain is due to arthritis in his spine and associated inflammation. There may be a bulging disc. We agreed to trial 7 days of prednisone. If he is not better within 10-14 days he agrees to see Ross [name]. New or worsening symptoms particularly weakness in the arm he would seek care immediately.   BP noted mildly elevated- likely due to acute pain- will monitor  Meds ordered this encounter  Medications  . predniSONE (DELTASONE) 20 MG tablet    Sig: Take 2 pills for 3 days, 1 pill for 4 days  Dispense:  10 tablet    Refill:  0  established issue- cervical arthritis. New acute issue with medication management- cervical radiculopathy  Return precautions advised.  Tana Conch, MD

## 2016-09-12 NOTE — Patient Instructions (Signed)
I suspect Ross [name]'s pain is due to arthritis in his spine and associated inflammation. There may be a bulging disc. We agreed to trial 7 days of prednisone. If he is not better within 10-14 days he agrees to see murphy-wainer. New or worsening symptoms particularly weakness in the arm he would seek care immediately.

## 2016-09-12 NOTE — Progress Notes (Signed)
Pre visit review using our clinic review tool, if applicable. No additional management support is needed unless otherwise documented below in the visit note. 

## 2016-09-20 DIAGNOSIS — G4733 Obstructive sleep apnea (adult) (pediatric): Secondary | ICD-10-CM | POA: Diagnosis not present

## 2016-10-20 DIAGNOSIS — G4733 Obstructive sleep apnea (adult) (pediatric): Secondary | ICD-10-CM | POA: Diagnosis not present

## 2016-10-27 ENCOUNTER — Encounter: Payer: Self-pay | Admitting: Family Medicine

## 2016-10-27 ENCOUNTER — Ambulatory Visit (INDEPENDENT_AMBULATORY_CARE_PROVIDER_SITE_OTHER): Payer: BLUE CROSS/BLUE SHIELD | Admitting: Family Medicine

## 2016-10-27 VITALS — BP 132/78 | HR 74 | Temp 98.4°F | Ht 73.0 in | Wt 315.6 lb

## 2016-10-27 DIAGNOSIS — M1A00X0 Idiopathic chronic gout, unspecified site, without tophus (tophi): Secondary | ICD-10-CM | POA: Diagnosis not present

## 2016-10-27 DIAGNOSIS — R739 Hyperglycemia, unspecified: Secondary | ICD-10-CM | POA: Diagnosis not present

## 2016-10-27 DIAGNOSIS — N529 Male erectile dysfunction, unspecified: Secondary | ICD-10-CM | POA: Diagnosis not present

## 2016-10-27 DIAGNOSIS — I10 Essential (primary) hypertension: Secondary | ICD-10-CM | POA: Diagnosis not present

## 2016-10-27 DIAGNOSIS — Z114 Encounter for screening for human immunodeficiency virus [HIV]: Secondary | ICD-10-CM | POA: Diagnosis not present

## 2016-10-27 DIAGNOSIS — Z0000 Encounter for general adult medical examination without abnormal findings: Secondary | ICD-10-CM | POA: Diagnosis not present

## 2016-10-27 DIAGNOSIS — Z125 Encounter for screening for malignant neoplasm of prostate: Secondary | ICD-10-CM | POA: Diagnosis not present

## 2016-10-27 LAB — LIPID PANEL
CHOLESTEROL: 163 mg/dL (ref 0–200)
HDL: 44.3 mg/dL (ref >39.00)
LDL Cholesterol: 91 mg/dL (ref 0–99)
NonHDL: 118.42
TRIGLYCERIDES: 136 mg/dL (ref 0.0–149.0)
Total CHOL/HDL Ratio: 4
VLDL: 27.2 mg/dL (ref 0.0–40.0)

## 2016-10-27 LAB — URIC ACID: Uric Acid, Serum: 6.6 mg/dL (ref 4.0–7.8)

## 2016-10-27 LAB — CBC
HEMATOCRIT: 40.6 % (ref 39.0–52.0)
HEMOGLOBIN: 14.1 g/dL (ref 13.0–17.0)
MCHC: 34.8 g/dL (ref 30.0–36.0)
MCV: 90.9 fl (ref 78.0–100.0)
PLATELETS: 177 10*3/uL (ref 150.0–400.0)
RBC: 4.46 Mil/uL (ref 4.22–5.81)
RDW: 13.7 % (ref 11.5–15.5)
WBC: 6.5 10*3/uL (ref 4.0–10.5)

## 2016-10-27 LAB — COMPREHENSIVE METABOLIC PANEL
ALT: 38 U/L (ref 0–53)
AST: 26 U/L (ref 0–37)
Albumin: 4.7 g/dL (ref 3.5–5.2)
Alkaline Phosphatase: 65 U/L (ref 39–117)
BILIRUBIN TOTAL: 1.2 mg/dL (ref 0.2–1.2)
BUN: 15 mg/dL (ref 6–23)
CALCIUM: 10 mg/dL (ref 8.4–10.5)
CHLORIDE: 102 meq/L (ref 96–112)
CO2: 27 meq/L (ref 19–32)
Creatinine, Ser: 0.92 mg/dL (ref 0.40–1.50)
GFR: 88.41 mL/min (ref >60.00)
Glucose, Bld: 117 mg/dL — ABNORMAL HIGH (ref 70–99)
POTASSIUM: 3.9 meq/L (ref 3.5–5.1)
Sodium: 139 mEq/L (ref 135–145)
Total Protein: 7.2 g/dL (ref 6.0–8.3)

## 2016-10-27 LAB — HEMOGLOBIN A1C: Hgb A1c MFr Bld: 5.8 % (ref 4.6–6.5)

## 2016-10-27 LAB — PSA: PSA: 1.42 ng/mL (ref 0.10–4.00)

## 2016-10-27 MED ORDER — SILDENAFIL CITRATE 20 MG PO TABS: ORAL_TABLET | ORAL | 3 refills | Status: DC

## 2016-10-27 NOTE — Patient Instructions (Addendum)
Please stop by lab before you go  Wt Readings from Last 3 Encounters:  10/27/16 (!) 315 lb 9.6 oz (143.2 kg)  09/12/16 (!) 314 lb 9.6 oz (142.7 kg)  07/28/16 (!) 303 lb 6.4 oz (137.6 kg)  I want to see you back in 3 months and I want you back to 303 or lower. You can do this !

## 2016-10-27 NOTE — Assessment & Plan Note (Signed)
HTN- controlled on losartan-hct 100-12.5mg  (recognizing he has gout history but poor control of BP and edema off hctz)

## 2016-10-27 NOTE — Progress Notes (Signed)
Phone: [phone]  Subjective:  Patient presents today for their annual physical. Chief complaint-noted.   See problem oriented charting- ROS- full  review of systems was completed and negative including No chest pain or shortness of breath. No headache or blurry vision.   The following were reviewed and entered/updated in epic: Past Medical History:  Diagnosis Date  . Anal fissure   . Arthritis   . DDD (degenerative disc disease)   . Family history of malignant neoplasm of gastrointestinal tract    mother  . Fatty liver   . GERD (gastroesophageal reflux disease)   . Glaucoma   . Gout   . Hemorrhoids   . Hypertension   . Rectal bleeding   . Sleep apnea   . Stroke [facility])    in right eye   Patient Active Problem List   Diagnosis Date Noted  . Elevated blood sugar 08/10/2015    Priority: Medium  . BPH associated with nocturia 01/12/2015    Priority: Medium  . Essential hypertension 11/27/2009    Priority: Medium  . Obstructive sleep apnea 10/29/2009    Priority: Medium  . Gout 01/25/2007    Priority: Medium  . Diarrhea 01/27/2015    Priority: Low  . Back pain with right-sided sciatica 09/02/2013    Priority: Low  . GERD (gastroesophageal reflux disease) 02/18/2011    Priority: Low  . Morbid obesity (HCC) 04/02/2009    Priority: Low  . ERECTILE DYSFUNCTION, ORGANIC 01/25/2007    Priority: Low   Past Surgical History:  Procedure Laterality Date  . EYE SURGERY Right    laser   . GLAUCOMA SURGERY Bilateral   . LUMBAR DISC SURGERY     L5  . PYLOROPLASTY     pyloric stenosis, as infant  . TONSILLECTOMY      Family History  Problem Relation Age of Onset  . Colon cancer Mother   . Breast cancer Mother   . Prostate cancer Father   . Testicular cancer Father   . Stomach cancer Paternal Grandmother   . Diabetes Maternal Grandmother   . Heart disease Maternal Grandmother   . Heart disease Maternal Grandfather     Medications- reviewed and updated Current  Outpatient Prescriptions  Medication Sig Dispense Refill  . allopurinol (ZYLOPRIM) 300 MG tablet Take 1 tablet (300 mg total) by mouth daily. 90 tablet 3  . aspirin 81 MG tablet Take 81 mg by mouth daily.      . colchicine 0.6 MG tablet Take 2 tablets at first sign of gout flare then repeat in 2 hours 1 tablet, take 1 tablet a day until resolved (or 10 days maximum). 90 tablet 3  . losartan-hydrochlorothiazide (HYZAAR) 100-12.5 MG tablet Take 1 tablet by mouth daily. 90 tablet 1  . Multiple Vitamin (MULTIVITAMIN) tablet Take 1 tablet by mouth. Once or twice a week    . omeprazole (PRILOSEC) 40 MG capsule TAKE 1 CAPSULE BY MOUTH EVERY MORNING. 100 capsule 3  . sildenafil (REVATIO) 20 MG tablet Take 2-5 tablets as needed once every 48 hours for erectile dysfunction 50 tablet 3   No current facility-administered medications for this visit.     Allergies-reviewed and updated No Known Allergies  Social History   Social History  . Marital status: Married    Spouse name: Isabelle Course  . Number of children: 3  . Years of education: N/A   Occupational History  . Location manager   .  [employer].   Social History Main Topics  .  Smoking status: Former Smoker    Packs/day: 1.00    Years: 20.00    Types: Cigarettes    Quit date: 05/17/1991  . Smokeless tobacco: Never Used  . Alcohol use 1.0 oz/week    2 Standard drinks or equivalent per week     Comment: Social use   . Drug use: No  . Sexual activity: Yes   Other Topics Concern  . None   Social History Narrative   Married- wife on liver transplant HCV- he was tested and negative. 2 children prior marriage. 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       Hobbies: out to eat, shopping   Volleyball, cycling, basketball in past.    Family history researc    Objective: BP 132/78 (BP Location: Left Arm, Patient Position: Sitting, Cuff Size: Large)   Pulse 74   Temp 98.4 F (36.9 C)  (Oral)   Ht 6\' 1"  (1.854 m)   Wt (!) 315 lb 9.6 oz (143.2 kg)   SpO2 98%   BMI 41.64 kg/m  Gen: NAD, resting comfortably HEENT: Mucous membranes are moist. Oropharynx normal Neck: no thyromegaly CV: RRR no murmurs rubs or gallops Lungs: CTAB no crackles, wheeze, rhonchi Abdomen: soft/nontender/nondistended/normal bowel sounds. No rebound or guarding.  Ext: 1+ edema Skin: warm, dry Neuro: grossly normal, moves all extremities, PERRLA Rectal: normal tone, diffusely enlarged prostate, no masses or tenderness  Assessment/Plan:  61 y.o. male presenting for annual physical.  Health Maintenance counseling: 1. Anticipatory guidance: Patient counseled regarding regular dental exams q6 months, eye exams - retinal specialist once a year ("blew blood vessel near 2010), then Dr. Dione BoozeGroat twice a year, wearing seatbelts.  2. Risk factor reduction:  Advised patient of need for regular exercise and diet rich and fruits and vegetables to reduce risk of heart attack and stroke. Exercise- walking down but getting restarted on this after mom had heart surgery. Diet-states still low on soda but has not been eating the best- encouraged him to reverse this. 2 recent course of prednisone in last 2 months working against him Wt Readings from Last 3 Encounters:  10/27/16 (!) 315 lb 9.6 oz (143.2 kg)  09/12/16 (!) 314 lb 9.6 oz (142.7 kg)  07/28/16 (!) 303 lb 6.4 oz (137.6 kg)  3. Immunizations/screenings/ancillary studies- opts in HIV screen in. Opts out shingrix- had zostavax last year Immunization History  Administered Date(s) Administered  . Influenza Split 02/13/2013  . Influenza,inj,Quad PF,36+ Mos 02/08/2016  . Influenza-Unspecified 02/13/2014  . Td 01/25/2007  . Zoster 03/21/2016   4. Prostate cancer screening-   Low risk psa trend in past. Update today. BPH on exam but no nodules. Nocturia 1-2x a night Lab Results  Component Value Date   PSA 0.88 07/30/2015   PSA 1.18 05/05/2014   PSA 0.98  04/30/2013   5. Colon cancer screening - 05/04/15 with 10 year follow up 6. Skin cancer screening- dermatology does not see. Advised sunscreen use.   Status of chronic or acute concerns   Seen about 6 weeks ago for cervical radiculopathy. 7 day course of prednisone. Rarely gets a tingle into his neck. May see murphy/wainer.   Some low back pain - discussed weight loss and core strengthening  Obesity- weight slipping up - discussed reversing this grend  ED- sildenafil sent in through [facility]  Gout Gout- compliant with 300mg  allopurinol. No gout flares since increase  Essential hypertension HTN- controlled on  losartan-hct 100-12.5mg  (recognizing he has gout history but poor control of BP and edema off hctz)  Return in about 3 months (around 01/27/2017).  Orders Placed This Encounter  Procedures  . PSA  . Lipid panel    [place]    Order Specific Question:   Has the patient fasted?    Answer:   No  . CBC    [place]  . Comprehensive metabolic panel    [place]    Order Specific Question:   Has the patient fasted?    Answer:   No  . Hemoglobin A1c    [place]  . Uric Acid  . HIV antibody   Meds ordered this encounter  Medications  . sildenafil (REVATIO) 20 MG tablet    Sig: Take 2-5 tablets as needed once every 48 hours for erectile dysfunction    Dispense:  50 tablet    Refill:  3   Return precautions advised.  Tana Conch, MD

## 2016-10-27 NOTE — Assessment & Plan Note (Signed)
Gout- compliant with 300mg  allopurinol. No gout flares since increase

## 2016-10-28 LAB — HIV ANTIBODY (ROUTINE TESTING W REFLEX): HIV 1&2 Ab, 4th Generation: NONREACTIVE

## 2016-11-22 DIAGNOSIS — G4733 Obstructive sleep apnea (adult) (pediatric): Secondary | ICD-10-CM | POA: Diagnosis not present

## 2016-12-08 DIAGNOSIS — H348312 Tributary (branch) retinal vein occlusion, right eye, stable: Secondary | ICD-10-CM | POA: Diagnosis not present

## 2016-12-08 DIAGNOSIS — H401131 Primary open-angle glaucoma, bilateral, mild stage: Secondary | ICD-10-CM | POA: Diagnosis not present

## 2016-12-08 DIAGNOSIS — H2513 Age-related nuclear cataract, bilateral: Secondary | ICD-10-CM | POA: Diagnosis not present

## 2016-12-08 DIAGNOSIS — H31091 Other chorioretinal scars, right eye: Secondary | ICD-10-CM | POA: Diagnosis not present

## 2016-12-22 DIAGNOSIS — G4733 Obstructive sleep apnea (adult) (pediatric): Secondary | ICD-10-CM | POA: Diagnosis not present

## 2017-01-09 ENCOUNTER — Ambulatory Visit (INDEPENDENT_AMBULATORY_CARE_PROVIDER_SITE_OTHER): Payer: BLUE CROSS/BLUE SHIELD | Admitting: Ophthalmology

## 2017-01-09 DIAGNOSIS — H35033 Hypertensive retinopathy, bilateral: Secondary | ICD-10-CM | POA: Diagnosis not present

## 2017-01-09 DIAGNOSIS — I10 Essential (primary) hypertension: Secondary | ICD-10-CM

## 2017-01-09 DIAGNOSIS — H43813 Vitreous degeneration, bilateral: Secondary | ICD-10-CM | POA: Diagnosis not present

## 2017-01-09 DIAGNOSIS — H348132 Central retinal vein occlusion, bilateral, stable: Secondary | ICD-10-CM | POA: Diagnosis not present

## 2017-01-09 DIAGNOSIS — H348332 Tributary (branch) retinal vein occlusion, bilateral, stable: Secondary | ICD-10-CM | POA: Diagnosis not present

## 2017-01-23 DIAGNOSIS — G4733 Obstructive sleep apnea (adult) (pediatric): Secondary | ICD-10-CM | POA: Diagnosis not present

## 2017-01-26 ENCOUNTER — Ambulatory Visit (INDEPENDENT_AMBULATORY_CARE_PROVIDER_SITE_OTHER): Payer: BLUE CROSS/BLUE SHIELD | Admitting: Family Medicine

## 2017-01-26 ENCOUNTER — Encounter: Payer: Self-pay | Admitting: Family Medicine

## 2017-01-26 DIAGNOSIS — Z23 Encounter for immunization: Secondary | ICD-10-CM | POA: Diagnosis not present

## 2017-01-26 DIAGNOSIS — M1A00X0 Idiopathic chronic gout, unspecified site, without tophus (tophi): Secondary | ICD-10-CM | POA: Diagnosis not present

## 2017-01-26 DIAGNOSIS — I10 Essential (primary) hypertension: Secondary | ICD-10-CM

## 2017-01-26 NOTE — Assessment & Plan Note (Signed)
S: on allopurinol  with no flares since starting despite being on hctz. No dark stools this tim A/P: continue current dose

## 2017-01-26 NOTE — Assessment & Plan Note (Signed)
S: controlled on losartan HCT 100-12.5mg  (once again recognize risks to gout but has worked well for him for BP and not caused edema).  BP Readings from Last 3 Encounters:  01/26/17 132/86  10/27/16 132/78  09/12/16 140/80  A/P: We discussed blood pressure goal of <140/90. Continue current meds

## 2017-01-26 NOTE — Assessment & Plan Note (Signed)
S:  weight continues to creep up. Issues at home got in the way of his walking. Admits to poor diet, has been lower on soda. Prior had been on prednisone twice but has not been on that and weight still increasing  This time- states dad died, had to get son out of trouble. Having to deal with estate. States had lost 12 lbs before these barriers Wt Readings from Last 3 Encounters:  01/26/17 (!) 317 lb (143.8 kg)  10/27/16 (!) 315 lb 9.6 oz (143.2 kg)  09/12/16 (!) 314 lb 9.6 oz (142.7 kg)  A/P: counseled patient on barriers and continue to work through them- hopeful to continue his journey when things slow down. We set a goal of 5 lbs by 6 month follow up until things settle down- may get more aggressive with goals at follow up

## 2017-01-26 NOTE — Patient Instructions (Addendum)
No changes today  Modest goal of 5 lbs off in next 4 months with all the barriers you've had  Consider talking with hospice about bereavement counseling  Flu shot before you leave

## 2017-01-26 NOTE — Progress Notes (Signed)
Subjective:  Lum Ross J Depaul Jr. is a 61 y.o. year old very pleasant male patient who presents for/with See problem oriented charting ROS- No chest pain or shortness of breath. No headache or blurry vision.    Past Medical History-  Patient Active Problem List   Diagnosis Date Noted  . Elevated blood sugar 08/10/2015    Priority: Medium  . BPH associated with nocturia 01/12/2015    Priority: Medium  . Essential hypertension 11/27/2009    Priority: Medium  . Obstructive sleep apnea 10/29/2009    Priority: Medium  . Gout 01/25/2007    Priority: Medium  . Diarrhea 01/27/2015    Priority: Low  . Back pain with right-sided sciatica 09/02/2013    Priority: Low  . GERD (gastroesophageal reflux disease) 02/18/2011    Priority: Low  . Morbid obesity (HCC) 04/02/2009    Priority: Low  . ERECTILE DYSFUNCTION, ORGANIC 01/25/2007    Priority: Low    Medications- reviewed and updated Current Outpatient Prescriptions  Medication Sig Dispense Refill  . allopurinol (ZYLOPRIM) 300 MG tablet Take 1 tablet (300 mg total) by mouth daily. 90 tablet 3  . aspirin 81 MG tablet Take 81 mg by mouth daily.      . colchicine 0.6 MG tablet Take 2 tablets at first sign of gout flare then repeat in 2 hours 1 tablet, take 1 tablet a day until resolved (or 10 days maximum). 90 tablet 3  . losartan-hydrochlorothiazide (HYZAAR) 100-12.5 MG tablet Take 1 tablet by mouth daily. 90 tablet 1  . Multiple Vitamin (MULTIVITAMIN) tablet Take 1 tablet by mouth. Once or twice a week    . omeprazole (PRILOSEC) 40 MG capsule TAKE 1 CAPSULE BY MOUTH EVERY MORNING. 100 capsule 3  . sildenafil (REVATIO) 20 MG tablet Take 2-5 tablets as needed once every 48 hours for erectile dysfunction 50 tablet 3   Objective: BP 132/86 (BP Location: Left Arm, Patient Position: Sitting, Cuff Size: Large)   Pulse 76   Temp 98.7 F (37.1 C) (Oral)   Ht 6\' 1"  (1.854 m)   Wt (!) 317 lb (143.8 kg)   SpO2 94%   BMI 41.82 kg/m  Gen: NAD,  resting comfortably CV: RRR no murmurs rubs or gallops Lungs: CTAB no crackles, wheeze, rhonchi Abdomen: soft/nontender/nondistended/normal bowel sounds.  Skin: warm, dry Neuro: normal gait  Assessment/Plan:  Neck occasional pain- radiculopathy not present  Good support system with losses- wife and father.   Morbid obesity (HCC) S:  weight continues to creep up. Issues at home got in the way of his walking. Admits to poor diet, has been lower on soda. Prior had been on prednisone twice but has not been on that and weight still increasing  This time- states dad died, had to get son out of trouble. Having to deal with estate. States had lost 12 lbs before these barriers Wt Readings from Last 3 Encounters:  01/26/17 (!) 317 lb (143.8 kg)  10/27/16 (!) 315 lb 9.6 oz (143.2 kg)  09/12/16 (!) 314 lb 9.6 oz (142.7 kg)  A/P: counseled patient on barriers and continue to work through them- hopeful to continue his journey when things slow down. We set a goal of 5 lbs by 6 month follow up until things settle down- may get more aggressive with goals at follow up  Gout S: on allopurinol 300mg  with no flares since starting despite being on hctz. No dark stools this tim A/P: continue current dose  Essential hypertension S: controlled on losartan  HCT 100-12.5mg  (once again recognize risks to gout but has worked well for him for BP and not caused edema).  BP Readings from Last 3 Encounters:  01/26/17 132/86  10/27/16 132/78  09/12/16 140/80  A/P: We discussed blood pressure goal of <140/90. Continue current meds  Future Appointments Date Time Provider Department Center  04/04/2017 9:15 AM Oretha Milch, MD [facility] None  01/11/2018 7:45 AM Sherrie George, MD [facility] None   Return in about 4 months (around 05/28/2017) for physical. come fasting, follow up- or sooner if needed.  Orders Placed This Encounter  Procedures  . Flu Vaccine QUAD 36+ mos IM   Return precautions advised.   Tana Conch, MD

## 2017-02-22 DIAGNOSIS — G4733 Obstructive sleep apnea (adult) (pediatric): Secondary | ICD-10-CM | POA: Diagnosis not present

## 2017-03-03 ENCOUNTER — Other Ambulatory Visit: Payer: Self-pay | Admitting: *Deleted

## 2017-03-03 DIAGNOSIS — R05 Cough: Secondary | ICD-10-CM

## 2017-03-03 DIAGNOSIS — R053 Chronic cough: Secondary | ICD-10-CM

## 2017-03-03 MED ORDER — LOSARTAN POTASSIUM-HCTZ 100-12.5 MG PO TABS: 1.0000 | ORAL_TABLET | Freq: Every day | ORAL | 1 refills | Status: DC

## 2017-03-21 ENCOUNTER — Encounter: Payer: Self-pay | Admitting: Family Medicine

## 2017-03-21 ENCOUNTER — Ambulatory Visit: Payer: BLUE CROSS/BLUE SHIELD | Admitting: Family Medicine

## 2017-03-21 VITALS — BP 120/82 | HR 74 | Temp 98.2°F | Ht 73.0 in | Wt 316.6 lb

## 2017-03-21 DIAGNOSIS — R5383 Other fatigue: Secondary | ICD-10-CM | POA: Diagnosis not present

## 2017-03-21 DIAGNOSIS — M722 Plantar fascial fibromatosis: Secondary | ICD-10-CM

## 2017-03-21 LAB — TSH: TSH: 2.42 u[IU]/mL (ref 0.35–4.50)

## 2017-03-21 MED ORDER — MELOXICAM 15 MG PO TABS
15.0000 mg | ORAL_TABLET | Freq: Every day | ORAL | 0 refills | Status: DC
Start: 2017-03-21 — End: 2017-04-04

## 2017-03-21 NOTE — Patient Instructions (Addendum)
Take mobic for 10 days with food. Will have 15 pills left over which you can use sparingly in the future. Do not take ibuprofen with this or aleve or motrin. Can take tylenol  Weight loss is key- you are off to a good start  See plantar fasciitis handout. Start exercises in 5 days other than frozen can roll start now.   Check back in within a month if not improving or if symptoms worsen  Please stop by lab before you go

## 2017-03-21 NOTE — Progress Notes (Signed)
Subjective:  Ross [name] Jr. is a 61 y.o. year old very pleasant male patient who presents for/with See problem oriented charting ROS- no fever, chills. No expanding redness at the heel. Has had some fatigue- has lost 6 lbs per his report but this is very challenging for him.    Past Medical History-  Patient Active Problem List   Diagnosis Date Noted  . Elevated blood sugar 08/10/2015    Priority: Medium  . BPH associated with nocturia 01/12/2015    Priority: Medium  . Essential hypertension 11/27/2009    Priority: Medium  . Obstructive sleep apnea 10/29/2009    Priority: Medium  . Gout 01/25/2007    Priority: Medium  . Diarrhea 01/27/2015    Priority: Low  . Back pain with right-sided sciatica 09/02/2013    Priority: Low  . GERD (gastroesophageal reflux disease) 02/18/2011    Priority: Low  . Morbid obesity (HCC) 04/02/2009    Priority: Low  . ERECTILE DYSFUNCTION, ORGANIC 01/25/2007    Priority: Low    Medications- reviewed and updated Current Outpatient Medications  Medication Sig Dispense Refill  . allopurinol (ZYLOPRIM) 300 MG tablet Take 1 tablet (300 mg total) by mouth daily. 90 tablet 3  . aspirin 81 MG tablet Take 81 mg by mouth daily.      . colchicine 0.6 MG tablet Take 2 tablets at first sign of gout flare then repeat in 2 hours 1 tablet, take 1 tablet a day until resolved (or 10 days maximum). 90 tablet 3  . losartan-hydrochlorothiazide (HYZAAR) 100-12.5 MG tablet Take 1 tablet by mouth daily. 90 tablet 1  . Multiple Vitamin (MULTIVITAMIN) tablet Take 1 tablet by mouth. Once or twice a week    . omeprazole (PRILOSEC) 40 MG capsule TAKE 1 CAPSULE BY MOUTH EVERY MORNING. 100 capsule 3  . sildenafil (REVATIO) 20 MG tablet Take 2-5 tablets as needed once every 48 hours for erectile dysfunction 50 tablet 3  . meloxicam (MOBIC) 15 MG tablet Take 1 tablet (15 mg total) daily by mouth. 25 tablet 0   No current facility-administered medications for this visit.      Objective: BP 120/82 (BP Location: Left Arm, Patient Position: Sitting, Cuff Size: Large)   Pulse 74   Temp 98.2 F (36.8 C) (Oral)   Ht 6\' 1"  (1.854 m)   Wt (!) 316 lb 9.6 oz (143.6 kg)   SpO2 96%   BMI 41.77 kg/m  Gen: NAD, resting comfortably CV: RRR no murmurs rubs or gallops Lungs: nonlabored Abdomen: morbid obesity Ext: no edema Skin: warm, dry MSK: severe tenderness with palpation at plantar fascia insertion point. Some dryness of skin at heel- no erythema, warmth to suggest infectious cause.   Assessment/Plan:  Plantar fasciitis S: left heel pain for 2-3 weeks. Not getting worse but not getting any better. Tried epsom salt soaks. Bought aquaphor ointment for cracked heel. Tried antibiotic ointment Really bad when first gets up after sleeping or after sitting for extended period. Pain can get up to moderate to severe. Tried to start walking recently for weight loss but this heel pain has prevented it A/P: mobic for 10 days. Home exercises per sports medicine advisor. Icing discussed as well as other conservative care including relative rest, supportive shoes, gel cups  Morbid obesity (HCC) He has made some important changes including "throwing out" cakes and cookies. Drinking mainly wate-r cut down on sodas. Using wheat bread instead of white. States he had gained weight so at present he  is actually down 6-7 lbs but states it has been a serious struggle. He asks about thyroid testing as he has had some mild fatigue.  Lab Results  Component Value Date   TSH 2.42 03/21/2017  Thyroid testing normal today and not cause- will send him message to continue healthy lifestyle choices.   Future Appointments  Date Time Provider Department Center  04/04/2017  9:15 AM Oretha MilchAlva, Rakesh V, MD [facility] None  05/26/2017  8:15 AM Shelva MajesticHunter, Kaliopi Blyden O, MD [facility] None  01/11/2018  7:45 AM Sherrie GeorgeMatthews, John D, MD [facility] None   Orders Placed This Encounter  Procedures  . TSH    [place]     Meds ordered this encounter  Medications  . meloxicam (MOBIC) 15 MG tablet    Sig: Take 1 tablet (15 mg total) daily by mouth.    Dispense:  25 tablet    Refill:  0  has some extra for prn use in future. New acute condition - plantar fasciitis with med management in addition to stable chronic obesity  Return precautions advised.  Tana ConchStephen Vanna Shavers, MD

## 2017-03-21 NOTE — Assessment & Plan Note (Signed)
He has made some important changes including "throwing out" cakes and cookies. Drinking mainly wate-r cut down on sodas. Using wheat bread instead of white. States he had gained weight so at present he is actually down 6-7 lbs but states it has been a serious struggle. He asks about thyroid testing as he has had some mild fatigue.  Lab Results  Component Value Date   TSH 2.42 03/21/2017  Thyroid testing normal today and not cause- will send him message to continue healthy lifestyle choices.

## 2017-03-24 DIAGNOSIS — G4733 Obstructive sleep apnea (adult) (pediatric): Secondary | ICD-10-CM | POA: Diagnosis not present

## 2017-04-04 ENCOUNTER — Ambulatory Visit: Payer: BLUE CROSS/BLUE SHIELD | Admitting: Pulmonary Disease

## 2017-04-04 ENCOUNTER — Encounter: Payer: Self-pay | Admitting: Pulmonary Disease

## 2017-04-04 DIAGNOSIS — G4733 Obstructive sleep apnea (adult) (pediatric): Secondary | ICD-10-CM | POA: Diagnosis not present

## 2017-04-04 NOTE — Patient Instructions (Signed)
Your CPAP is set at 13 cm and seems to be working well. CPAP supplies will be renewed for a year

## 2017-04-04 NOTE — Assessment & Plan Note (Signed)
Weight loss encouraged. He will start his walking again

## 2017-04-04 NOTE — Progress Notes (Signed)
   Subjective:    Patient ID: Ross Babeharles J Janelle Jr., male    DOB: 01/01/1956, 61 y.o.   MRN: [id number]  HPI  61 yo for FU of  severe OSA .   He was started on CPAP of 13 cm in January 2017 and this is really helped improve his data.  He does also decrease his nocturia.  He likes a full facemask and this was decreased dryness of his mouth. Unfortunately he has gained about 20 pounds.  He had a couple of deaths in the family and has really not been able to exercise much.  Download was reviewed and this shows control of events on 13 cm with moderate leak.  There is questions about vascular screening study for his neck arteries    Significant tests/ events reviewed   PSG 2011 AHI 19/hr  Home sleep study January 2017 with AHI of 42.4/hr , low sat 61% 08/2015  CPAP >> 13 cm of H2O.    Review of Systems Patient denies significant dyspnea,cough, hemoptysis,  chest pain, palpitations, pedal edema, orthopnea, paroxysmal nocturnal dyspnea, lightheadedness, nausea, vomiting, abdominal or  leg pains      Objective:   Physical Exam  Gen. Pleasant, obese, in no distress ENT - no lesions, no post nasal drip Neck: No JVD, no thyromegaly, no carotid bruits Lungs: no use of accessory muscles, no dullness to percussion, decreased without rales or rhonchi  Cardiovascular: Rhythm regular, heart sounds  normal, no murmurs or gallops, no peripheral edema Musculoskeletal: No deformities, no cyanosis or clubbing , no tremors        Assessment & Plan:

## 2017-04-13 DIAGNOSIS — M722 Plantar fascial fibromatosis: Secondary | ICD-10-CM | POA: Diagnosis not present

## 2017-04-13 DIAGNOSIS — M542 Cervicalgia: Secondary | ICD-10-CM | POA: Diagnosis not present

## 2017-04-13 DIAGNOSIS — M25511 Pain in right shoulder: Secondary | ICD-10-CM | POA: Diagnosis not present

## 2017-04-24 DIAGNOSIS — G4733 Obstructive sleep apnea (adult) (pediatric): Secondary | ICD-10-CM | POA: Diagnosis not present

## 2017-05-18 DIAGNOSIS — M542 Cervicalgia: Secondary | ICD-10-CM | POA: Diagnosis not present

## 2017-05-19 DIAGNOSIS — M5020 Other cervical disc displacement, unspecified cervical region: Secondary | ICD-10-CM | POA: Diagnosis not present

## 2017-05-19 DIAGNOSIS — M5030 Other cervical disc degeneration, unspecified cervical region: Secondary | ICD-10-CM | POA: Diagnosis not present

## 2017-05-19 DIAGNOSIS — M9901 Segmental and somatic dysfunction of cervical region: Secondary | ICD-10-CM | POA: Diagnosis not present

## 2017-05-19 DIAGNOSIS — M9902 Segmental and somatic dysfunction of thoracic region: Secondary | ICD-10-CM | POA: Diagnosis not present

## 2017-05-24 DIAGNOSIS — M5020 Other cervical disc displacement, unspecified cervical region: Secondary | ICD-10-CM | POA: Diagnosis not present

## 2017-05-24 DIAGNOSIS — M9901 Segmental and somatic dysfunction of cervical region: Secondary | ICD-10-CM | POA: Diagnosis not present

## 2017-05-24 DIAGNOSIS — G4733 Obstructive sleep apnea (adult) (pediatric): Secondary | ICD-10-CM | POA: Diagnosis not present

## 2017-05-24 DIAGNOSIS — M9902 Segmental and somatic dysfunction of thoracic region: Secondary | ICD-10-CM | POA: Diagnosis not present

## 2017-05-24 DIAGNOSIS — M5030 Other cervical disc degeneration, unspecified cervical region: Secondary | ICD-10-CM | POA: Diagnosis not present

## 2017-05-26 ENCOUNTER — Ambulatory Visit: Payer: BLUE CROSS/BLUE SHIELD | Admitting: Family Medicine

## 2017-05-26 ENCOUNTER — Encounter: Payer: Self-pay | Admitting: Family Medicine

## 2017-05-26 VITALS — BP 130/76 | HR 75 | Temp 98.1°F | Ht 73.0 in | Wt 323.8 lb

## 2017-05-26 DIAGNOSIS — I10 Essential (primary) hypertension: Secondary | ICD-10-CM

## 2017-05-26 DIAGNOSIS — M47812 Spondylosis without myelopathy or radiculopathy, cervical region: Secondary | ICD-10-CM

## 2017-05-26 DIAGNOSIS — Z23 Encounter for immunization: Secondary | ICD-10-CM | POA: Diagnosis not present

## 2017-05-26 DIAGNOSIS — R739 Hyperglycemia, unspecified: Secondary | ICD-10-CM | POA: Diagnosis not present

## 2017-05-26 DIAGNOSIS — M1A00X0 Idiopathic chronic gout, unspecified site, without tophus (tophi): Secondary | ICD-10-CM | POA: Diagnosis not present

## 2017-05-26 DIAGNOSIS — N529 Male erectile dysfunction, unspecified: Secondary | ICD-10-CM

## 2017-05-26 NOTE — Assessment & Plan Note (Addendum)
S: walking 30 minutes a day- started dec 26th- back bothered him at first- has been able to speed up. Has lost 11 lbs- had gained prior (so weight actually only shows 2 lbs down on our scales.   At risk for diabetes with last a1c of 5.8.  A/P: Patient wants to set smaller goals like 10 lbs each visit. 6 month goal of 10 lbs. Continue walking- as well as healthier eating. Weight loss and walking seems to help his joints, hoping it helps ED issues as well.

## 2017-05-26 NOTE — Assessment & Plan Note (Signed)
ED- sildenafil sent in through [facility] last visit- still having issues. He asks about an elevate mens clinic in [place]- he can trial this if he desires. Discussed role of exercise/weight loss and fact he could try penis pump.

## 2017-05-26 NOTE — Assessment & Plan Note (Addendum)
S:[facility]aw Dr[facility] Cleophas [facility]- who stated not much they could do for cervical arthritis other than injections or surgery. Patient has not tried injections yet. We also last year did a course of prednisone for cervical radiculopathy which helped some. has been seeing chiropractor and helps some.  A/P: continue current conservative measures- he wants to avoid injections and surgeries if able. Would want to see Dr. Venetia MaxonStern again if needed who did his low back surgery -sent x-ray to scan into [facility]

## 2017-05-26 NOTE — Assessment & Plan Note (Signed)
See discussion under elevated blood sugar- conversations heavily overlap

## 2017-05-26 NOTE — Patient Instructions (Addendum)
Tdap today  Great job starting with walking and getting weight down 2 lbs- I know you can continue this journey. Lets set a goal of 10 lbs at least by follow up.

## 2017-05-26 NOTE — Progress Notes (Signed)
Subjective:  Ross BabeCharles J Breuer Jr. is a 62 y.o. year old very pleasant male patient who presents for/with See problem oriented charting ROS- had fatigue with walking which has improved as continued to walk. Stable edema. No chest pain. No shortness of breath above baseline.    Past Medical History-  Patient Active Problem List   Diagnosis Date Noted  . Cervical arthritis 05/26/2017    Priority: Medium  . Elevated blood sugar 08/10/2015    Priority: Medium  . BPH associated with nocturia 01/12/2015    Priority: Medium  . Essential hypertension 11/27/2009    Priority: Medium  . Obstructive sleep apnea 10/29/2009    Priority: Medium  . Gout 01/25/2007    Priority: Medium  . Diarrhea 01/27/2015    Priority: Low  . Back pain with right-sided sciatica 09/02/2013    Priority: Low  . GERD (gastroesophageal reflux disease) 02/18/2011    Priority: Low  . Morbid obesity (HCC) 04/02/2009    Priority: Low  . ERECTILE DYSFUNCTION, ORGANIC 01/25/2007    Priority: Low    Medications- reviewed and updated Current Outpatient Medications  Medication Sig Dispense Refill  . allopurinol (ZYLOPRIM) 300 MG tablet Take 1 tablet (300 mg total) by mouth daily. 90 tablet 3  . aspirin 81 MG tablet Take 81 mg by mouth daily.      . colchicine 0.6 MG tablet Take 2 tablets at first sign of gout flare then repeat in 2 hours 1 tablet, take 1 tablet a day until resolved (or 10 days maximum). 90 tablet 3  . losartan-hydrochlorothiazide (HYZAAR) 100-12.5 MG tablet Take 1 tablet by mouth daily. 90 tablet 1  . Multiple Vitamin (MULTIVITAMIN) tablet Take 1 tablet by mouth. Once or twice a week    . omeprazole (PRILOSEC) 40 MG capsule TAKE 1 CAPSULE BY MOUTH EVERY MORNING. 100 capsule 3  . sildenafil (REVATIO) 20 MG tablet Take 2-5 tablets as needed once every 48 hours for erectile dysfunction 50 tablet 3   No current facility-administered medications for this visit.     Objective: BP 130/76 (BP Location: Left  Arm, Patient Position: Sitting, Cuff Size: Large)   Pulse 75   Temp 98.1 F (36.7 C) (Oral)   Ht 6\' 1"  (1.854 m)   Wt (!) 323 lb 12.8 oz (146.9 kg)   SpO2 95%   BMI 42.72 kg/m  Gen: NAD, resting comfortably CV: RRR no murmurs rubs or gallops Lungs: CTAB no crackles, wheeze, rhonchi Abdomen: soft/nontender/nondistended/normal bowel sounds. obese Ext: trace edema Skin: warm, dry  Assessment/Plan:  Other notes 1. Also working on weight loss and core strengthening for low back in past. Chiropractor helping him some  Gout S: Remains on allopurinol 300mg . No gout flares in recent memory- last noted 09/2015. Uric acid hair high at 6.6.  A/P: continue current medication  Essential hypertension S: controlled today on losartan-hct 100-12.5mg  (recognizing he has gout history but poor control of BP and edema off hctz) BP Readings from Last 3 Encounters:  05/26/17 130/76  04/04/17 122/74  03/21/17 120/82  A/P:at blood pressure goal of <140/90. Continue current meds  Elevated blood sugar S: walking 30 minutes a day- started dec 26th- back bothered him at first- has been able to speed up. Has lost 11 lbs- had gained prior (so weight actually only shows 2 lbs down on our scales.   At risk for diabetes with last a1c of 5.8.  A/P: Patient wants to set smaller goals like 10 lbs each visit. 6 month  goal of 10 lbs. Continue walking- as well as healthier eating. Weight loss and walking seems to help his joints, hoping it helps ED issues as well.   Morbid obesity (HCC) See discussion under elevated blood sugar- conversations heavily overlap  Cervical arthritis S:Saw Dr. [facility]- who stated not much they could do for cervical arthritis other than injections or surgery. Patient has not tried injections yet. We also last year did a course of prednisone for cervical radiculopathy which helped some. has been seeing chiropractor and helps some.  A/P: continue current conservative  measures- he wants to avoid injections and surgeries if able. Would want to see Dr. Venetia Maxon again if needed who did his low back surgery -sent x-ray to scan into [facility]  ERECTILE DYSFUNCTION, ORGANIC ED- sildenafil sent in through [facility] last visit- still having issues. He asks about an elevate mens clinic in [place]- he can trial this if he desires. Discussed role of exercise/weight loss and fact he could try penis pump.    Future Appointments  Date Time Provider Department Center  11/10/2017  8:15 AM Shelva Majestic, MD [facility]  01/11/2018  7:45 AM Sherrie George, MD [facility] None  04/04/2018  9:15 AM Parrett, Virgel Bouquet, NP [facility] None   Return in about 6 months (around 11/23/2017) for physical, come fasting.  Orders Placed This Encounter  Procedures  . Tdap vaccine greater than or equal to 7yo IM   Return precautions advised.  Tana Conch, MD

## 2017-05-30 DIAGNOSIS — M9901 Segmental and somatic dysfunction of cervical region: Secondary | ICD-10-CM | POA: Diagnosis not present

## 2017-05-30 DIAGNOSIS — M9902 Segmental and somatic dysfunction of thoracic region: Secondary | ICD-10-CM | POA: Diagnosis not present

## 2017-05-30 DIAGNOSIS — M5020 Other cervical disc displacement, unspecified cervical region: Secondary | ICD-10-CM | POA: Diagnosis not present

## 2017-05-30 DIAGNOSIS — M5030 Other cervical disc degeneration, unspecified cervical region: Secondary | ICD-10-CM | POA: Diagnosis not present

## 2017-05-31 ENCOUNTER — Other Ambulatory Visit: Payer: Self-pay | Admitting: *Deleted

## 2017-05-31 DIAGNOSIS — H40053 Ocular hypertension, bilateral: Secondary | ICD-10-CM | POA: Diagnosis not present

## 2017-05-31 DIAGNOSIS — H2513 Age-related nuclear cataract, bilateral: Secondary | ICD-10-CM | POA: Diagnosis not present

## 2017-05-31 MED ORDER — ALLOPURINOL 300 MG PO TABS: 300.0000 mg | ORAL_TABLET | Freq: Every day | ORAL | 3 refills | Status: DC

## 2017-06-07 DIAGNOSIS — M9901 Segmental and somatic dysfunction of cervical region: Secondary | ICD-10-CM | POA: Diagnosis not present

## 2017-06-07 DIAGNOSIS — M9902 Segmental and somatic dysfunction of thoracic region: Secondary | ICD-10-CM | POA: Diagnosis not present

## 2017-06-07 DIAGNOSIS — M5030 Other cervical disc degeneration, unspecified cervical region: Secondary | ICD-10-CM | POA: Diagnosis not present

## 2017-06-07 DIAGNOSIS — M5020 Other cervical disc displacement, unspecified cervical region: Secondary | ICD-10-CM | POA: Diagnosis not present

## 2017-06-13 DIAGNOSIS — M5030 Other cervical disc degeneration, unspecified cervical region: Secondary | ICD-10-CM | POA: Diagnosis not present

## 2017-06-13 DIAGNOSIS — M9902 Segmental and somatic dysfunction of thoracic region: Secondary | ICD-10-CM | POA: Diagnosis not present

## 2017-06-13 DIAGNOSIS — M5020 Other cervical disc displacement, unspecified cervical region: Secondary | ICD-10-CM | POA: Diagnosis not present

## 2017-06-13 DIAGNOSIS — M9901 Segmental and somatic dysfunction of cervical region: Secondary | ICD-10-CM | POA: Diagnosis not present

## 2017-06-28 DIAGNOSIS — M5030 Other cervical disc degeneration, unspecified cervical region: Secondary | ICD-10-CM | POA: Diagnosis not present

## 2017-06-28 DIAGNOSIS — M5020 Other cervical disc displacement, unspecified cervical region: Secondary | ICD-10-CM | POA: Diagnosis not present

## 2017-06-28 DIAGNOSIS — M9901 Segmental and somatic dysfunction of cervical region: Secondary | ICD-10-CM | POA: Diagnosis not present

## 2017-06-28 DIAGNOSIS — M9902 Segmental and somatic dysfunction of thoracic region: Secondary | ICD-10-CM | POA: Diagnosis not present

## 2017-07-12 DIAGNOSIS — M9901 Segmental and somatic dysfunction of cervical region: Secondary | ICD-10-CM | POA: Diagnosis not present

## 2017-07-12 DIAGNOSIS — M5030 Other cervical disc degeneration, unspecified cervical region: Secondary | ICD-10-CM | POA: Diagnosis not present

## 2017-07-12 DIAGNOSIS — M5020 Other cervical disc displacement, unspecified cervical region: Secondary | ICD-10-CM | POA: Diagnosis not present

## 2017-07-12 DIAGNOSIS — M9902 Segmental and somatic dysfunction of thoracic region: Secondary | ICD-10-CM | POA: Diagnosis not present

## 2017-07-24 DIAGNOSIS — G4733 Obstructive sleep apnea (adult) (pediatric): Secondary | ICD-10-CM | POA: Diagnosis not present

## 2017-09-04 ENCOUNTER — Other Ambulatory Visit: Payer: Self-pay

## 2017-09-04 DIAGNOSIS — R05 Cough: Secondary | ICD-10-CM

## 2017-09-04 DIAGNOSIS — R053 Chronic cough: Secondary | ICD-10-CM

## 2017-09-04 MED ORDER — LOSARTAN POTASSIUM-HCTZ 100-12.5 MG PO TABS: 1.0000 | ORAL_TABLET | Freq: Every day | ORAL | 1 refills | Status: DC

## 2017-09-12 ENCOUNTER — Telehealth: Payer: Self-pay | Admitting: Pulmonary Disease

## 2017-09-12 NOTE — Telephone Encounter (Signed)
Block staff can also be dressed, not necessarily mold. Let us wait and see what [facility] has to say. He may need to change the filters more often

## 2017-09-12 NOTE — Telephone Encounter (Signed)
Called and spoke with patient, he states that they told him to clean it out then run the machine without water in it. Patient was advised by the tech at the company that it was not mold. Patient states once he cleaned it it was gone. He changed filters as well. Patient states he will call us if anything else is needed.

## 2017-09-12 NOTE — Telephone Encounter (Signed)
Spoke with pt, states that he went to change cpap filter this morning and noticed black "stuff" that he is assuming is mold.  Pt also notes that there is black "stuff" also in water reservoir.  Pt changes filters and mask routinely, and is concerned about using the machine with this in it.  Pt is taking his machine to [facility] today for further evaluation, but would like RA's recs.    RA please advise.  Thanks.

## 2017-09-19 ENCOUNTER — Other Ambulatory Visit: Payer: Self-pay

## 2017-09-19 MED ORDER — OMEPRAZOLE 40 MG PO CPDR: DELAYED_RELEASE_CAPSULE | ORAL | 3 refills | Status: DC

## 2017-09-22 DIAGNOSIS — G4733 Obstructive sleep apnea (adult) (pediatric): Secondary | ICD-10-CM | POA: Diagnosis not present

## 2017-11-10 ENCOUNTER — Ambulatory Visit (INDEPENDENT_AMBULATORY_CARE_PROVIDER_SITE_OTHER): Payer: BLUE CROSS/BLUE SHIELD | Admitting: Family Medicine

## 2017-11-10 ENCOUNTER — Encounter: Payer: Self-pay | Admitting: Family Medicine

## 2017-11-10 VITALS — BP 118/86 | HR 77 | Temp 99.0°F | Ht 73.0 in | Wt 315.6 lb

## 2017-11-10 DIAGNOSIS — M1A00X0 Idiopathic chronic gout, unspecified site, without tophus (tophi): Secondary | ICD-10-CM

## 2017-11-10 DIAGNOSIS — E8881 Metabolic syndrome: Secondary | ICD-10-CM | POA: Diagnosis not present

## 2017-11-10 DIAGNOSIS — Z125 Encounter for screening for malignant neoplasm of prostate: Secondary | ICD-10-CM

## 2017-11-10 DIAGNOSIS — Z0000 Encounter for general adult medical examination without abnormal findings: Secondary | ICD-10-CM

## 2017-11-10 DIAGNOSIS — Z79899 Other long term (current) drug therapy: Secondary | ICD-10-CM | POA: Diagnosis not present

## 2017-11-10 DIAGNOSIS — K219 Gastro-esophageal reflux disease without esophagitis: Secondary | ICD-10-CM

## 2017-11-10 DIAGNOSIS — I10 Essential (primary) hypertension: Secondary | ICD-10-CM

## 2017-11-10 LAB — COMPREHENSIVE METABOLIC PANEL
ALBUMIN: 4.7 g/dL (ref 3.5–5.2)
ALK PHOS: 64 U/L (ref 39–117)
ALT: 32 U/L (ref 0–53)
AST: 22 U/L (ref 0–37)
BILIRUBIN TOTAL: 1 mg/dL (ref 0.2–1.2)
BUN: 12 mg/dL (ref 6–23)
CALCIUM: 9.7 mg/dL (ref 8.4–10.5)
CO2: 27 mEq/L (ref 19–32)
CREATININE: 0.91 mg/dL (ref 0.40–1.50)
Chloride: 102 mEq/L (ref 96–112)
GFR: 89.23 mL/min (ref >60.00)
Glucose, Bld: 109 mg/dL — ABNORMAL HIGH (ref 70–99)
Potassium: 4.4 mEq/L (ref 3.5–5.1)
Sodium: 140 mEq/L (ref 135–145)
TOTAL PROTEIN: 7.2 g/dL (ref 6.0–8.3)

## 2017-11-10 LAB — POC URINALSYSI DIPSTICK (AUTOMATED)
Bilirubin, UA: NEGATIVE
Blood, UA: NEGATIVE
Glucose, UA: NEGATIVE
Ketones, UA: NEGATIVE
Leukocytes, UA: NEGATIVE
NITRITE UA: NEGATIVE
PH UA: 6 (ref 5.0–8.0)
Protein, UA: NEGATIVE
SPEC GRAV UA: 1.02 (ref 1.010–1.025)
UROBILINOGEN UA: 0.2 U/dL

## 2017-11-10 LAB — LIPID PANEL
CHOLESTEROL: 158 mg/dL (ref 0–200)
HDL: 42.1 mg/dL (ref >39.00)
LDL Cholesterol: 79 mg/dL (ref 0–99)
NonHDL: 115.4
TRIGLYCERIDES: 181 mg/dL — AB (ref 0.0–149.0)
Total CHOL/HDL Ratio: 4
VLDL: 36.2 mg/dL (ref 0.0–40.0)

## 2017-11-10 LAB — CBC
HCT: 42.2 % (ref 39.0–52.0)
Hemoglobin: 14.5 g/dL (ref 13.0–17.0)
MCHC: 34.4 g/dL (ref 30.0–36.0)
MCV: 92.1 fl (ref 78.0–100.0)
PLATELETS: 177 10*3/uL (ref 150.0–400.0)
RBC: 4.58 Mil/uL (ref 4.22–5.81)
RDW: 13.4 % (ref 11.5–15.5)
WBC: 8.4 10*3/uL (ref 4.0–10.5)

## 2017-11-10 LAB — VITAMIN B12: VITAMIN B 12: 419 pg/mL (ref 211–911)

## 2017-11-10 LAB — PSA: PSA: 1.17 ng/mL (ref 0.10–4.00)

## 2017-11-10 LAB — HEMOGLOBIN A1C: HEMOGLOBIN A1C: 6 % (ref 4.6–6.5)

## 2017-11-10 NOTE — Progress Notes (Signed)
Phone: [phone]  Subjective:  Patient presents today for their annual physical. Chief complaint-noted.   See problem oriented charting- ROS- full  review of systems was completed and negative except for: joint pian, neck pain and stiffness  The following were reviewed and entered/updated in epic: Past Medical History:  Diagnosis Date  . Anal fissure   . Arthritis   . DDD (degenerative disc disease)   . Family history of malignant neoplasm of gastrointestinal tract    mother  . Fatty liver   . GERD (gastroesophageal reflux disease)   . Glaucoma   . Gout   . Hemorrhoids   . Hypertension   . Rectal bleeding   . Sleep apnea   . Stroke [facility](HCC)    in right eye   Patient Active Problem List   Diagnosis Date Noted  . Cervical arthritis 05/26/2017    Priority: Medium  . Elevated blood sugar 08/10/2015    Priority: Medium  . BPH associated with nocturia 01/12/2015    Priority: Medium  . Essential hypertension 11/27/2009    Priority: Medium  . Obstructive sleep apnea 10/29/2009    Priority: Medium  . Gout 01/25/2007    Priority: Medium  . Diarrhea 01/27/2015    Priority: Low  . Back pain with right-sided sciatica 09/02/2013    Priority: Low  . GERD (gastroesophageal reflux disease) 02/18/2011    Priority: Low  . Morbid obesity (HCC) 04/02/2009    Priority: Low  . ERECTILE DYSFUNCTION, ORGANIC 01/25/2007    Priority: Low   Past Surgical History:  Procedure Laterality Date  . EYE SURGERY Right    laser   . GLAUCOMA SURGERY Bilateral   . LUMBAR DISC SURGERY     L5  . PYLOROPLASTY     pyloric stenosis, as infant  . TONSILLECTOMY      Family History  Problem Relation Age of Onset  . Colon cancer Mother   . Breast cancer Mother   . Prostate cancer Father   . Testicular cancer Father   . Stomach cancer Paternal Grandmother   . Diabetes Maternal Grandmother   . Heart disease Maternal Grandmother   . Heart disease Maternal Grandfather   . Other Son    "stomach aneurysm"    Medications- reviewed and updated Current Outpatient Medications  Medication Sig Dispense Refill  . allopurinol (ZYLOPRIM) 300 MG tablet Take 1 tablet (300 mg total) by mouth daily. 90 tablet 3  . aspirin 81 MG tablet Take 81 mg by mouth daily.      . colchicine 0.6 MG tablet Take 2 tablets at first sign of gout flare then repeat in 2 hours 1 tablet, take 1 tablet a day until resolved (or 10 days maximum). 90 tablet 3  . losartan-hydrochlorothiazide (HYZAAR) 100-12.5 MG tablet Take 1 tablet by mouth daily. 90 tablet 1  . Multiple Vitamin (MULTIVITAMIN) tablet Take 1 tablet by mouth. Once or twice a week    . omeprazole (PRILOSEC) 40 MG capsule TAKE 1 CAPSULE BY MOUTH EVERY MORNING. 100 capsule 3  . sildenafil (REVATIO) 20 MG tablet Take 2-5 tablets as needed once every 48 hours for erectile dysfunction 50 tablet 3   No current facility-administered medications for this visit.    Allergies-reviewed and updated No Known Allergies  Social History   Social History Narrative   Married- lost wife Isabelle CourseLydia Dec 2017n liver transplant HCV- he was tested and negative. 2 children prior marriage.       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       Hobbies: out to eat, shopping   Volleyball, cycling, basketball in past.    Family history researc    Objective: BP 118/86 (BP Location: Left Arm, Patient Position: Sitting, Cuff Size: Large)   Pulse 77   Temp 99 F (37.2 C) (Oral)   Ht 6\' 1"  (1.854 m)   Wt (!) 315 lb 9.6 oz (143.2 kg)   SpO2 96%   BMI 41.64 kg/m  Gen: NAD, resting comfortably HEENT: Mucous membranes are moist. Oropharynx normal Neck: no thyromegaly CV: RRR no murmurs rubs or gallops Lungs: CTAB no crackles, wheeze, rhonchi Abdomen: soft/nontender/nondistended/normal bowel sounds. No rebound or guarding.  Ext: no edema Skin: warm, dry Neuro: grossly normal, moves all extremities, PERRLA Rectal: normal tone, diffusely enlarged prostate, no masses  or tenderness  Assessment/Plan:  62 y.o. male presenting for annual physical.  Health Maintenance counseling: 1. Anticipatory guidance: Patient counseled regarding regular dental exams -q6 months, eye exams - Dr Dione Booze twice yearly and Dr. Jerolyn Center once a year, wearing seatbelts.  2. Risk factor reduction:  Advised patient of need for regular exercise and diet rich and fruits and vegetables to reduce risk of heart attack and stroke. Exercise- walking some, has been active trying to move out of his home. Diet-weight down 8 lbs- back to last years weight- states eating slightly better. He is hoping being close to some family members and friends and Jonita Albee that walk when he moves he will be in better position.  Wt Readings from Last 3 Encounters:  11/10/17 (!) 315 lb 9.6 oz (143.2 kg)  05/26/17 (!) 323 lb 12.8 oz (146.9 kg)  04/04/17 (!) 325 lb 12.8 oz (147.8 kg)  3. Immunizations/screenings/ancillary studies- consider shingrix next year when things more stable on home front given his upcoming move Immunization History  Administered Date(s) Administered  . Influenza Split 02/13/2013  . Influenza,inj,Quad PF,6+ Mos 02/08/2016, 01/26/2017  . Influenza-Unspecified 02/13/2014  . Td 01/25/2007  . Tdap 05/26/2017  . Zoster 03/21/2016  4. Prostate cancer screening- trend PSA- has had some fluctuations over last few years. Make sure not trending up today. Low risk rectal exam - some BPH. Nocturia stable once a night  Lab Results  Component Value Date   PSA 1.42 10/27/2016   PSA 0.88 07/30/2015   PSA 1.18 05/05/2014   5. Colon cancer screening - 05/04/15 with 10 year follow up. Biopsy was done but no precancer.  6. Skin cancer screening- no dermatologist. advised regular sunscreen use. Denies worrisome, changing, or new skin lesions.  7. Former smoker- quit 1992 with 15 pack years. Will get AAA at 63 and will get urine today  Status of chronic or acute concerns   Moving back to [place] 45 mins away- he  has been working hard on house hunting and getting his house ready- has been very stressful  Gout- He has been compliant with allopurinol 300mg  since that time. Has colchicine on hand. Has had 0 flares in last year. Could come off hctz if recurrent issue  HTN- controlled on losartan-hctz 100-12.5mg   CBD oil- 1 drop in AM- joints and neck better. Discussed I do not have evidence for or against CBD oil at this point  GERD- compliant with omeprazole 40mg . Update b12 today.   ED- uses prn sildenafil.   Patient remains on ASA. His LDL is 91 and prefers to stay off statin. 10 year risk right at 10%- can continue asa at least through age 33  Insulin resistance-  update a1c today. Have discussed importance of weight loss as above. Remains morbidly obese Lab Results  Component Value Date   HGBA1C 5.8 10/27/2016   No problem-specific Assessment & Plan notes found for this encounter.   Future Appointments  Date Time Provider Department Center  01/11/2018  7:45 AM Sherrie George, MD [facility] None  04/04/2018  9:15 AM Parrett, Virgel Bouquet, NP [facility] None   Return in about 6 months (around 05/12/2018) for follow up- or sooner if needed.  Lab/Order associations: Preventative health care - Plan: CBC, Comprehensive metabolic panel, Lipid panel, Hemoglobin A1c, PSA, POCT Urinalysis Dipstick (Automated), Vitamin B12  Essential hypertension - Plan: CBC, Comprehensive metabolic panel, Lipid panel, POCT Urinalysis Dipstick (Automated)  Idiopathic chronic gout without tophus, unspecified site  Morbid obesity (HCC)  Gastroesophageal reflux disease, esophagitis presence not specified - Plan: Vitamin B12  Screening for prostate cancer - Plan: PSA  Insulin resistance - Plan: Hemoglobin A1c  High risk medication use - Plan: Vitamin B12  Return precautions advised.  Tana Conch, MD

## 2017-11-10 NOTE — Addendum Note (Signed)
Addended by: Felix AhmadiFRANSEN, Pershing [place] A on: 11/10/2017 09:42 AM   Modules accepted: Orders

## 2017-11-10 NOTE — Patient Instructions (Addendum)
Please stop by lab before you go  I want you to set a goal of losing at least 10 lbs in next 6 months.

## 2017-11-11 NOTE — Progress Notes (Signed)
Please let us know by responding to this when you get this mychart message and let us know if you understand the results/directions  Urine normal, b12 normal.  Your CBC was normal (blood counts, infection fighting cells, platelets). Your CMET was normal (kidney, liver, and electrolytes, blood sugar) other than showing you are at risk for diabetes similar to prior years. At risk for diabetes with hemoglobin a1c of 6.0 up from 5.8 last year suggesting risk of diabetes is increasing (at risk from 5.7-6.4). Healthy eating, regular exercise, weight loss advised. Above 6 we could consider metformin to help prevent diabetes. Your cholesterol is reasonable without meds but triglycerides slipping up -weight loss and regular exercise can help Your PSA trend was low risk for prostate cancer  I apologize but I did not order a uric acid as intended- can yall check with lab to see if that can be added on? I dont think it will be able to but just want to check. Luckily no recent gout flares so not as important

## 2017-11-22 DIAGNOSIS — G4733 Obstructive sleep apnea (adult) (pediatric): Secondary | ICD-10-CM | POA: Diagnosis not present

## 2018-01-11 ENCOUNTER — Encounter (INDEPENDENT_AMBULATORY_CARE_PROVIDER_SITE_OTHER): Payer: BLUE CROSS/BLUE SHIELD | Admitting: Ophthalmology

## 2018-01-11 DIAGNOSIS — H43813 Vitreous degeneration, bilateral: Secondary | ICD-10-CM

## 2018-01-11 DIAGNOSIS — I10 Essential (primary) hypertension: Secondary | ICD-10-CM

## 2018-01-11 DIAGNOSIS — H35033 Hypertensive retinopathy, bilateral: Secondary | ICD-10-CM

## 2018-01-11 DIAGNOSIS — H348112 Central retinal vein occlusion, right eye, stable: Secondary | ICD-10-CM

## 2018-01-11 DIAGNOSIS — H2513 Age-related nuclear cataract, bilateral: Secondary | ICD-10-CM

## 2018-01-11 DIAGNOSIS — H348312 Tributary (branch) retinal vein occlusion, right eye, stable: Secondary | ICD-10-CM | POA: Diagnosis not present

## 2018-01-25 DIAGNOSIS — G4733 Obstructive sleep apnea (adult) (pediatric): Secondary | ICD-10-CM | POA: Diagnosis not present

## 2018-02-26 DIAGNOSIS — G4733 Obstructive sleep apnea (adult) (pediatric): Secondary | ICD-10-CM | POA: Diagnosis not present

## 2018-03-03 ENCOUNTER — Other Ambulatory Visit: Payer: Self-pay | Admitting: Family Medicine

## 2018-03-03 DIAGNOSIS — R05 Cough: Secondary | ICD-10-CM

## 2018-03-03 DIAGNOSIS — R053 Chronic cough: Secondary | ICD-10-CM

## 2018-03-21 DIAGNOSIS — Z23 Encounter for immunization: Secondary | ICD-10-CM | POA: Diagnosis not present

## 2018-03-26 ENCOUNTER — Telehealth: Payer: Self-pay | Admitting: *Deleted

## 2018-03-26 NOTE — Telephone Encounter (Signed)
Copied from CRM [id number]. Topic: General - Other >> Mar 26, 2018  9:30 AM Percival Spanish wrote:  Pt said the pharmacy is out of the below med and said maybe the doctor can RX to separate pills     pt has an appt on 03/28/18    losartan-hydrochlorothiazide (HYZAAR) 100-12.5 MG tablet   [facility] [place]

## 2018-03-28 ENCOUNTER — Ambulatory Visit: Payer: BLUE CROSS/BLUE SHIELD | Admitting: Family Medicine

## 2018-03-28 ENCOUNTER — Encounter: Payer: Self-pay | Admitting: Family Medicine

## 2018-03-28 VITALS — BP 130/80 | HR 87 | Temp 98.0°F | Ht 73.0 in | Wt 326.6 lb

## 2018-03-28 DIAGNOSIS — R21 Rash and other nonspecific skin eruption: Secondary | ICD-10-CM | POA: Diagnosis not present

## 2018-03-28 DIAGNOSIS — I10 Essential (primary) hypertension: Secondary | ICD-10-CM | POA: Diagnosis not present

## 2018-03-28 DIAGNOSIS — Z6841 Body Mass Index (BMI) 40.0 and over, adult: Secondary | ICD-10-CM

## 2018-03-28 DIAGNOSIS — M65332 Trigger finger, left middle finger: Secondary | ICD-10-CM | POA: Diagnosis not present

## 2018-03-28 DIAGNOSIS — G4733 Obstructive sleep apnea (adult) (pediatric): Secondary | ICD-10-CM | POA: Diagnosis not present

## 2018-03-28 MED ORDER — HYDROCHLOROTHIAZIDE 12.5 MG PO TABS: 12.5000 mg | ORAL_TABLET | Freq: Every day | ORAL | 3 refills | Status: DC

## 2018-03-28 MED ORDER — LOSARTAN POTASSIUM 100 MG PO TABS: 100.0000 mg | ORAL_TABLET | Freq: Every day | ORAL | 3 refills | Status: DC

## 2018-03-28 MED ORDER — TRIAMCINOLONE ACETONIDE 0.1 % EX CREA: 1.0000 "application " | TOPICAL_CREAM | Freq: Two times a day (BID) | CUTANEOUS | 0 refills | Status: DC

## 2018-03-28 NOTE — Assessment & Plan Note (Signed)
S: controlled on losartan 100-12.5mg  but pharmacy having hard time getting more of this. They hav adequate supply of split doses- 100 losartan and 12.5 hctz BP Readings from Last 3 Encounters:  03/28/18 130/80  11/10/17 118/86  05/26/17 130/76  A/P: We discussed blood pressure goal of <140/90. Continue current meds:  But split into 2 separate pills

## 2018-03-28 NOTE — Progress Notes (Signed)
Subjective:  Ross [name]. is a 62 y.o. year old very pleasant male patient who presents for/with See problem oriented charting ROS- no reported chest pain. See rash ROS below    Past Medical History-  Patient Active Problem List   Diagnosis Date Noted  . Cervical arthritis 05/26/2017    Priority: Medium  . Elevated blood sugar 08/10/2015    Priority: Medium  . BPH associated with nocturia 01/12/2015    Priority: Medium  . Essential hypertension 11/27/2009    Priority: Medium  . Obstructive sleep apnea 10/29/2009    Priority: Medium  . Gout 01/25/2007    Priority: Medium  . Diarrhea 01/27/2015    Priority: Low  . Back pain with right-sided sciatica 09/02/2013    Priority: Low  . GERD (gastroesophageal reflux disease) 02/18/2011    Priority: Low  . Morbid obesity (HCC) 04/02/2009    Priority: Low  . ERECTILE DYSFUNCTION, ORGANIC 01/25/2007    Priority: Low    Medications- reviewed and updated Current Outpatient Medications  Medication Sig Dispense Refill  . allopurinol (ZYLOPRIM) 300 MG tablet Take 1 tablet (300 mg total) by mouth daily. 90 tablet 3  . aspirin 81 MG tablet Take 81 mg by mouth daily.      . colchicine 0.6 MG tablet Take 2 tablets at first sign of gout flare then repeat in 2 hours 1 tablet, take 1 tablet a day until resolved (or 10 days maximum). 90 tablet 3  . Multiple Vitamin (MULTIVITAMIN) tablet Take 1 tablet by mouth. Once or twice a week    . omeprazole (PRILOSEC) 40 MG capsule TAKE 1 CAPSULE BY MOUTH EVERY MORNING. 100 capsule 3  . sildenafil (REVATIO) 20 MG tablet Take 2-5 tablets as needed once every 48 hours for erectile dysfunction 50 tablet 3  . hydrochlorothiazide (HYDRODIURIL) 12.5 MG tablet Take 1 tablet (12.5 mg total) by mouth daily. 90 tablet 3  . losartan (COZAAR) 100 MG tablet Take 1 tablet (100 mg total) by mouth daily. 90 tablet 3  . triamcinolone cream (KENALOG) 0.1 % Apply 1 application topically 2 (two) times daily. For 7-10 days  maximum on itchy areas on legs 160 g 0   No current facility-administered medications for this visit.     Objective: BP 130/80 (BP Location: Left Arm, Patient Position: Sitting, Cuff Size: Large)   Pulse 87   Temp 98 F (36.7 C) (Oral)   Ht 6\' 1"  (1.854 m)   Wt (!) 326 lb 9.6 oz (148.1 kg)   SpO2 96%   BMI 43.09 kg/m  Gen: NAD, resting comfortably CV: RRR no murmurs rubs or gallops Lungs: CTAB no crackles, wheeze, rhonchi Abdomen: soft/nontender/nondistended/normal bowel sounds. No rebound or guarding.  Ext: 1+ edema with venous stasis changes Skin: warm, dry, mild erythema and fine scaling/some excoriation over lower legs particularly on right  Assessment/Plan:  Morbid Obesity Subjective: Weight trending up with move- has had less time to walk and eat well.  Assess/Plan: worsening. Discussed focusing on being weight neutral over holidays at least. Beyond that- Encouraged need for healthy eating, regular exercise, weight loss.   Rash S:history of intermittent rash on bilateral lower legs- used to get similar rash when working and he thought it was heat rash. 2 weeks ago states they were warm, scarlet red. No longer warm and quite as red but now very itchy.  ROS-not ill appearing, no fever/chills. No new medications. Not immunocompromised. No mucus membrane involvement.  A/P:  He was worried about  cellulitis- with multiple rounds in past healing without antibiotics I doubt. We discussed this likely is venous stasis changes that become acutely irritated. No signs of infection today- will trial steroid cream for 7 days and see us back if not improved.   Trigger middle finger of left hand - Plan: Ambulatory referral to Sports Medicine S: left 3rd finger has been triggering for several years- doesn't seem to be getting better- is irritating him more- almost anytime he closes hand gest triggered A/P: over a year of symptoms- im less confident in conservative care at this point. Will  refer to Dr. Berline Choughigby for his opinion and consideration of injection of steroids  Essential hypertension S: controlled on losartan 100-12.5mg  but pharmacy having hard time getting more of this. They hav adequate supply of split doses- 100 losartan and 12.5 hctz BP Readings from Last 3 Encounters:  03/28/18 130/80  11/10/17 118/86  05/26/17 130/76  A/P: We discussed blood pressure goal of <140/90. Continue current meds:  But split into 2 separate pills   Future Appointments  Date Time Provider Department Center  04/04/2018  9:30 AM Parrett, Virgel Bouquetammy S, NP [facility] None  05/21/2018 11:00 AM Shelva Majestic,  O, MD [facility]  04/01/2019  7:45 AM Sherrie GeorgeMatthews, John D, MD [facility] None   Lab/Order associations: Trigger middle finger of left hand - Plan: Ambulatory referral to Sports Medicine  Essential hypertension  Rash  Meds ordered this encounter  Medications  . losartan (COZAAR) 100 MG tablet    Sig: Take 1 tablet (100 mg total) by mouth daily.    Dispense:  90 tablet    Refill:  3  . hydrochlorothiazide (HYDRODIURIL) 12.5 MG tablet    Sig: Take 1 tablet (12.5 mg total) by mouth daily.    Dispense:  90 tablet    Refill:  3  . triamcinolone cream (KENALOG) 0.1 %    Sig: Apply 1 application topically 2 (two) times daily. For 7-10 days maximum on itchy areas on legs    Dispense:  160 g    Refill:  0    Return precautions advised.  Tana Conch , MD

## 2018-03-28 NOTE — Patient Instructions (Addendum)
Trial steroid cream/triamcinolone for the legs. Let us know if not improving.   Once this calms down- try compression stockings to help with prevention.   Sent in blood pressure medicine as 2 pills instead of one  Please schedule a visit with our excellent sports medicine physician Dr. Berline Choughigby before you leave at the check out desk so he can further evaluate your trigger finger

## 2018-03-28 NOTE — Telephone Encounter (Signed)
Patient was seen in office today and this was sent in

## 2018-03-30 ENCOUNTER — Encounter: Payer: Self-pay | Admitting: Sports Medicine

## 2018-03-30 ENCOUNTER — Ambulatory Visit (INDEPENDENT_AMBULATORY_CARE_PROVIDER_SITE_OTHER): Payer: BLUE CROSS/BLUE SHIELD | Admitting: Sports Medicine

## 2018-03-30 ENCOUNTER — Ambulatory Visit: Payer: Self-pay

## 2018-03-30 VITALS — BP 142/92 | HR 75 | Ht 73.0 in | Wt 329.2 lb

## 2018-03-30 DIAGNOSIS — M65332 Trigger finger, left middle finger: Secondary | ICD-10-CM

## 2018-03-30 DIAGNOSIS — M79645 Pain in left finger(s): Secondary | ICD-10-CM

## 2018-03-30 NOTE — Progress Notes (Signed)
Veverly FellsMichael D. Delorise Shinerigby, DO  [facility] [place] [phone]  Ross Babeharles J Pitones Jr. - 62 y.o. male MRN [id number]  Date of birth: 07-Aug-1955  Visit Date: 03/30/2018  PCP: Shelva MajesticHunter, Stephen O, MD   Referred by: Shelva MajesticHunter, Stephen O, MD   Scribe(s) for today's visit: Stevenson ClinchBrandy Coleman, CMA  SUBJECTIVE:  Ross Babeharles J Feldkamp Jr. is here for Initial Assessment (trigger finger middle finger L hand)  Referred by: Dr. Tana ConchStephen Hunter  HPI: His L middle finger symptoms INITIALLY: Began several years ago and MOI is unknown.  Described as mild tenderness and catching, nonradiating Worsened with making a fist.  Improved with rest.  Additional associated symptoms include: She denies swelling around the finger but does note that he has trouble straightening his finger.     At this time symptoms have been worsening over the past year. He has started dropping things d/t finger catching.  He has been taking Advil prn with some relief.   Hx of back surgery, L5, [facility].   REVIEW OF SYSTEMS: Denies night time disturbances. Denies fevers, chills, or night sweats. Denies unexplained weight loss. Denies personal history of cancer. Reports changes in bowel or bladder habits, GI upset, slight incontinence. Denies recent unreported falls. Denies new or worsening dyspnea or wheezing. Denies headaches or dizziness.  Reports numbness, tingling or weakness in the lower extremities.  Denies dizziness or presyncopal episodes Reports lower extremity edema    HISTORY:  Prior history reviewed and updated per electronic medical record.  Social History   Occupational History  . Occupation: Chief of StaffMachine Operator    Employer: [employer]  Tobacco Use  . Smoking status: Former Smoker    Packs/day: 1.00    Years: 20.00    Pack years: 20.00    Types: Cigarettes    Last attempt to quit: 05/17/1991    Years since quitting: 26.8  .  Smokeless tobacco: Never Used  Substance and Sexual Activity  . Alcohol use: Yes    Alcohol/week: 2.0 standard drinks    Types: 2 Standard drinks or equivalent per week    Comment: Social use   . Drug use: No  . Sexual activity: Yes   Social History   Social History Narrative   Married- lost wife Isabelle CourseLydia Dec 2017n liver transplant HCV- he was tested and negative. 2 children prior marriage.       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       Hobbies: out to eat, shopping   Volleyball, cycling, basketball in past.    Family history researc    Past Medical History:  Diagnosis Date  . Anal fissure   . Arthritis   . DDD (degenerative disc disease)   . Family history of malignant neoplasm of gastrointestinal tract    mother  . Fatty liver   . GERD (gastroesophageal reflux disease)   . Glaucoma   . Gout   . Hemorrhoids   . Hypertension   . Rectal bleeding   . Sleep apnea   . Stroke [facility](HCC)    in right eye   Past Surgical History:  Procedure Laterality Date  . EYE SURGERY Right    laser   . GLAUCOMA SURGERY Bilateral   . LUMBAR DISC SURGERY     L5  . PYLOROPLASTY     pyloric stenosis, as infant  . TONSILLECTOMY     family history includes Breast cancer in  his mother; Colon cancer in his mother; Diabetes in his maternal grandmother; Heart disease in his maternal grandfather and maternal grandmother; Other in his son; Prostate cancer in his father; Stomach cancer in his paternal grandmother; Testicular cancer in his father.  DATA OBTAINED & REVIEWED:   Recent Labs    11/10/17 0851  HGBA1C 6.0   Problem  Trigger Middle Finger of Left Hand     OBJECTIVE:  VS:  HT:6\' 1"  (185.4 cm)   WT:(!) 329 lb 3.2 oz (149.3 kg)  BMI:43.44    BP:(!) 142/92  HR:75bpm  TEMP: ( )  RESP:95 %   PHYSICAL EXAM: CONSTITUTIONAL: Well-developed, Well-nourished and In no acute distress PSYCHIATRIC: Alert & appropriately interactive. and Not depressed or anxious  appearing. RESPIRATORY: No increased work of breathing and Trachea Midline EYES: Pupils are equal., EOM intact without nystagmus. and No scleral icterus.  VASCULAR EXAM: Warm and well perfused NEURO: unremarkable  MSK Exam: Left hand  Well aligned, no significant deformity. No overlying skin changes. TTP over A1 pulley of the left third finger   RANGE OF MOTION & STRENGTH  Marked triggering of the left third finger.  Otherwise full flexion-extension of bilateral fingers.   SPECIALITY TESTING:  Palpable nodule appreciated.  Slight osteoarthritic bossing of all MCPs bilaterally but minimal.        ASSESSMENT   1. Pain of left middle finger   2. Trigger middle finger of left hand     PLAN:  Pertinent additional documentation may be included in corresponding procedure notes, imaging studies, problem based documentation and patient instructions.  Procedures:  . US Guided Injection per procedure note  Medications:  No orders of the defined types were placed in this encounter.  Discussion/Instructions: No problem-specific Assessment & Plan notes found for this encounter.  . Should do well with ultrasound-guided Hydro dissection of the A1 pulley.  Triggering has improved following the injection today. . Information regarding general musculoskeletal supplements provided given underlying osteoarthritic changes. . Discussed red flag symptoms that warrant earlier emergent evaluation and patient voices understanding. . Activity modifications and the importance of avoiding exacerbating activities (limiting pain to no more than a 4 / 10 during or following activity) recommended and discussed.  Follow-up:  . Return in about 6 weeks (around 05/11/2018).  . If any lack of improvement: Consider repeat injection     CMA/ATC served as scribe during this visit. History, Physical, and Plan performed by medical provider. Documentation and orders reviewed and attested to.      Andrena Mews, DO    [facility] [facility] Physician

## 2018-03-30 NOTE — Procedures (Signed)
PROCEDURE NOTE:  Ultrasound Guided: Injection: Left Third finger trigger finger, tendon sheath injection Images were obtained and interpreted by myself, Gaspar BiddingMichael , DO  Images have been saved and stored to PACS system. Images obtained on: GE S7 Ultrasound machine    ULTRASOUND FINDINGS:  Marked triggering of the third finger  DESCRIPTION OF PROCEDURE:  The patient's clinical condition is marked by substantial pain and/or significant functional disability. Other conservative therapy has not provided relief, is contraindicated, or not appropriate. There is a reasonable likelihood that injection will significantly improve the patient's pain and/or functional impairment.   After discussing the risks, benefits and expected outcomes of the injection and all questions were reviewed and answered, the patient wished to undergo the above named procedure.  Verbal consent was obtained.  The ultrasound was used to identify the target structure and adjacent neurovascular structures. The skin was then prepped in sterile fashion and the target structure was injected under direct visualization using sterile technique as below:  Single injection performed as below: PREP: Alcohol, Ethel Chloride and 1 cc 1% lidocaine on Insulin Needle APPROACH:direct, single injection, 25g 1.5 in. INJECTATE: 0.5 cc 1% lidocaine, 0.5 cc 0.5% Marcaine and 0.5 cc 40mg /mL DepoMedrol ASPIRATE: None DRESSING: Band-Aid  Post procedural instructions including recommending icing and warning signs for infection were reviewed.    This procedure was well tolerated and there were no complications.   IMPRESSION: Succesful Ultrasound Guided: Injection

## 2018-03-30 NOTE — Patient Instructions (Addendum)
You had an injection today.  Things to be aware of after injection are listed below: . You may experience no significant improvement or even a slight worsening in your symptoms during the first 24 to 48 hours.  After that we expect your symptoms to improve gradually over the next 2 weeks for the medicine to have its maximal effect.  You should continue to have improvement out to 6 weeks after your injection. . Dr. Berline Choughigby recommends icing the site of the injection for 20 minutes  1-2 times the day of your injection . You may shower but no swimming, tub bath or Jacuzzi for 24 hours. . If your bandage falls off this does not need to be replaced.  It is appropriate to remove the bandage after 4 hours. . You may resume light activities as tolerated unless otherwise directed per Dr. Berline Choughigby during your visit  POSSIBLE STEROID SIDE EFFECTS:  Side effects from injectable steroids tend to be less than when taken orally however you may experience some of the symptoms listed below.  If experienced these should only last for a short period of time. Change in menstrual flow  Edema (swelling)  Increased appetite Skin flushing (redness)  Skin rash/acne  Thrush (oral) Yeast vaginitis    Increased sweating  Depression Increased blood glucose levels Cramping and leg/calf  Euphoria (feeling happy)  POSSIBLE PROCEDURE SIDE EFFECTS: The side effects of the injection are usually fairly minimal however if you may experience some of the following side effects that are usually self-limited and will is off on their own.  If you are concerned please feel free to call the office with questions:  Increased numbness or tingling  Nausea or vomiting  Swelling or bruising at the injection site   Please call our office if if you experience any of the following symptoms over the next week as these can be signs of infection:   Fever greater than 100.63F  Significant swelling at the injection site  Significant redness or drainage  from the injection site  If after 2 weeks you are continuing to have worsening symptoms please call our office to discuss what the next appropriate actions should be including the potential for a return office visit or other diagnostic testing.     Tumeric - 500mg  by mouth twice daily Topical Arnica Gel: up to 4 times daily Fish Oil Supplements (Omega-3) - 2000mg  daily

## 2018-03-31 ENCOUNTER — Encounter: Payer: Self-pay | Admitting: Sports Medicine

## 2018-03-31 DIAGNOSIS — M65332 Trigger finger, left middle finger: Secondary | ICD-10-CM | POA: Insufficient documentation

## 2018-04-04 ENCOUNTER — Ambulatory Visit: Payer: BLUE CROSS/BLUE SHIELD | Admitting: Adult Health

## 2018-04-04 ENCOUNTER — Encounter: Payer: Self-pay | Admitting: Adult Health

## 2018-04-04 DIAGNOSIS — G4733 Obstructive sleep apnea (adult) (pediatric): Secondary | ICD-10-CM

## 2018-04-04 NOTE — Patient Instructions (Signed)
Keep up the good work Continue on CPAP at bedtime Work on healthy weight Do not drive a sleepy Follow-up with Dr. Alva in 1 year and as needed 

## 2018-04-04 NOTE — Progress Notes (Signed)
@Patient  ID: Ross [name]., male    DOB: 03-11-1956, 61 y.o.   MRN: [id number]  Chief Complaint  Patient presents with  . Follow-up    OSA    Referring provider: Shelva Majestic, MD  HPI: 62 year old male followed for severe sleep apnea   TEST/EVENTS :  PSG 2011 AHI 19/hr  Home sleep study January 2017 with AHI of 42.4/hr , low sat 61% 4/2017CPAP >>13 cm of H2O.   04/04/2018 Follow up : OSA  Patient presents for a one-year follow-up for severe sleep apnea.  Patient is on CPAP at bedtime.  Patient says he feels rested with no significant daytime sleepiness.  Patient wears his CPAP each night.  Download shows excellent control and compliance.  Average daily usage at 7.5 hours.  Patient is on CPAP 13 7 was H2O.  AHI 0.7.  Positive leaks. Cant live without CPAP . Uses full face mask.     No Known Allergies  Immunization History  Administered Date(s) Administered  . Influenza Split 02/13/2013  . Influenza,inj,Quad PF,6+ Mos 02/08/2016, 01/26/2017  . Influenza-Unspecified 02/13/2014, 03/14/2018  . Td 01/25/2007  . Tdap 05/26/2017  . Zoster 03/21/2016    Past Medical History:  Diagnosis Date  . Anal fissure   . Arthritis   . DDD (degenerative disc disease)   . Family history of malignant neoplasm of gastrointestinal tract    mother  . Fatty liver   . GERD (gastroesophageal reflux disease)   . Glaucoma   . Gout   . Hemorrhoids   . Hypertension   . Rectal bleeding   . Sleep apnea   . Stroke [facility])    in right eye    Tobacco History: Social History   Tobacco Use  Smoking Status Former Smoker  . Packs/day: 1.00  . Years: 20.00  . Pack years: 20.00  . Types: Cigarettes  . Last attempt to quit: 05/17/1991  . Years since quitting: 26.9  Smokeless Tobacco Never Used   Counseling given: Not Answered   Outpatient Medications Prior to Visit  Medication Sig Dispense Refill  . allopurinol (ZYLOPRIM) 300 MG tablet Take 1 tablet (300 mg total) by mouth  daily. 90 tablet 3  . aspirin 81 MG tablet Take 81 mg by mouth daily.      . colchicine 0.6 MG tablet Take 2 tablets at first sign of gout flare then repeat in 2 hours 1 tablet, take 1 tablet a day until resolved (or 10 days maximum). 90 tablet 3  . hydrochlorothiazide (HYDRODIURIL) 12.5 MG tablet Take 1 tablet (12.5 mg total) by mouth daily. 90 tablet 3  . losartan (COZAAR) 100 MG tablet Take 1 tablet (100 mg total) by mouth daily. 90 tablet 3  . Multiple Vitamin (MULTIVITAMIN) tablet Take 1 tablet by mouth. Once or twice a week    . omeprazole (PRILOSEC) 40 MG capsule TAKE 1 CAPSULE BY MOUTH EVERY MORNING. 100 capsule 3  . sildenafil (REVATIO) 20 MG tablet Take 2-5 tablets as needed once every 48 hours for erectile dysfunction 50 tablet 3  . triamcinolone cream (KENALOG) 0.1 % Apply 1 application topically 2 (two) times daily. For 7-10 days maximum on itchy areas on legs (Patient not taking: Reported on 04/04/2018) 160 g 0   No facility-administered medications prior to visit.      Review of Systems  Constitutional:   No  weight loss, night sweats,  Fevers, chills, fatigue, or  lassitude.  HEENT:   No headaches,  Difficulty  swallowing,  Tooth/dental problems, or  Sore throat,                No sneezing, itching, ear ache, nasal congestion, post nasal drip,   CV:  No chest pain,  Orthopnea, PND, swelling in lower extremities, anasarca, dizziness, palpitations, syncope.   GI  No heartburn, indigestion, abdominal pain, nausea, vomiting, diarrhea, change in bowel habits, loss of appetite, bloody stools.   Resp: No shortness of breath with exertion or at rest.  No excess mucus, no productive cough,  No non-productive cough,  No coughing up of blood.  No change in color of mucus.  No wheezing.  No chest wall deformity  Skin: no rash or lesions.  GU: no dysuria, change in color of urine, no urgency or frequency.  No flank pain, no hematuria   MS:  No joint pain or swelling.  No decreased  range of motion.  No back pain.    Physical Exam  BP 126/74 (BP Location: Left Arm, Cuff Size: Large)   Pulse 70   Ht 6' (1.829 m)   Wt (!) 329 lb (149.2 kg)   SpO2 96%   BMI 44.62 kg/m   GEN: A/Ox3; pleasant , NAD, obese    HEENT:  [place]/AT,  EACs-clear, TMs-wnl, NOSE-clear, THROAT-clear, no lesions, no postnasal drip or exudate noted. Class 2-3 MP airway   NECK:  Supple w/ fair ROM; no JVD; normal carotid impulses w/o bruits; no thyromegaly or nodules palpated; no lymphadenopathy.    RESP  Clear  P & A; w/o, wheezes/ rales/ or rhonchi. no accessory muscle use, no dullness to percussion  CARD:  RRR, no m/r/g, no peripheral edema, pulses intact, no cyanosis or clubbing.  GI:   Soft & nt; nml bowel sounds; no organomegaly or masses detected.   Musco: Warm bil, no deformities or joint swelling noted.   Neuro: alert, no focal deficits noted.    Skin: Warm, no lesions or rashes    Lab Results:  CBC  BNP No results found for: BNP  ProBNP No results found for: PROBNP  Imaging:     No flowsheet data found.  No results found for: NITRICOXIDE      Assessment & Plan:   Obstructive sleep apnea Excellent control and compliance on CPAP  Plan  Patient Instructions  Keep up the good work Continue on CPAP at bedtime Work on healthy weight Do not drive a sleepy Follow-up with Dr. Vassie LollAlva in 1 year and as needed     Morbid obesity (HCC) Wt loss      Rubye Oaksammy Parrett, NP 04/04/2018

## 2018-04-04 NOTE — Assessment & Plan Note (Signed)
Excellent control and compliance on CPAP  Plan  Patient Instructions  Keep up the good work Continue on CPAP at bedtime Work on healthy weight Do not drive a sleepy Follow-up with Dr. Vassie LollAlva in 1 year and as needed

## 2018-04-04 NOTE — Addendum Note (Signed)
Addended by: Maurene CapesPOTTS, Copelyn Widmer M on: 04/04/2018 09:34 AM   Modules accepted: Orders

## 2018-04-04 NOTE — Assessment & Plan Note (Signed)
Wt loss  

## 2018-04-29 DIAGNOSIS — R05 Cough: Secondary | ICD-10-CM | POA: Diagnosis not present

## 2018-04-30 DIAGNOSIS — G4733 Obstructive sleep apnea (adult) (pediatric): Secondary | ICD-10-CM | POA: Diagnosis not present

## 2018-05-11 ENCOUNTER — Encounter: Payer: Self-pay | Admitting: Sports Medicine

## 2018-05-11 ENCOUNTER — Ambulatory Visit: Payer: BLUE CROSS/BLUE SHIELD | Admitting: Sports Medicine

## 2018-05-11 VITALS — BP 122/84 | HR 87 | Ht 72.0 in | Wt 328.0 lb

## 2018-05-11 DIAGNOSIS — M47812 Spondylosis without myelopathy or radiculopathy, cervical region: Secondary | ICD-10-CM

## 2018-05-11 DIAGNOSIS — M65332 Trigger finger, left middle finger: Secondary | ICD-10-CM

## 2018-05-11 DIAGNOSIS — M5412 Radiculopathy, cervical region: Secondary | ICD-10-CM | POA: Diagnosis not present

## 2018-05-11 DIAGNOSIS — M722 Plantar fascial fibromatosis: Secondary | ICD-10-CM | POA: Insufficient documentation

## 2018-05-11 MED ORDER — DICLOFENAC SODIUM 2 % TD SOLN: 1.0000 "application " | Freq: Two times a day (BID) | TRANSDERMAL | 0 refills | Status: AC

## 2018-05-11 MED ORDER — DICLOFENAC SODIUM 2 % TD SOLN: 1.0000 "application " | Freq: Two times a day (BID) | TRANSDERMAL | 2 refills | Status: DC

## 2018-05-11 NOTE — Progress Notes (Signed)
Veverly FellsMichael D. Delorise Shinerigby, DO  [facility] [place] [phone]  Lum BabeCharles J Lewman Jr. - 62 y.o. male MRN [id number]  Date of birth: 1955-06-16  Visit Date: 05/11/2018  PCP: Shelva MajesticHunter, Stephen O, MD   Referred by: Shelva MajesticHunter, Stephen O, MD  SUBJECTIVE:   Chief Complaint  Patient presents with  . f/u L middle finger   HPI: Patient is here for evaluation of his left third finger pain.  He is having catching popping personally in the morning.  It is slightly improved since this initially began.  He had an injection on 03/30/2018 and responded well to it.  He is getting occasional pain still mechanical symptoms but overall is doing better.  He is using ibuprofen as needed and CBD oil.  Additionally he mentions that he is being treated for plantar fasciitis and is only having mild improvements.  His neck has also been slightly bothersome for him and he has had ongoing issues with this for quite some time.  Home traction was previously helpful for him.   REVIEW OF SYSTEMS: No significant nighttime awakenings due to this issue. Denies fevers, chills, recent weight gain or weight loss.  No night sweats.  Pt denies any change in bowel or bladder habits, muscle weakness, numbness or falls associated with this pain.  HISTORY:  Prior history reviewed and updated per electronic medical record.  Patient Active Problem List   Diagnosis Date Noted  . Venous stasis dermatitis 05/21/2018  . Plantar fasciitis 05/11/2018  . Trigger middle finger of left hand 03/31/2018  . Cervical arthritis 05/26/2017  . Elevated blood sugar 08/10/2015    Lab Results  Component Value Date   HGBA1C 5.8 10/27/2016  . Fasting cbg near 120   . Diarrhea 01/27/2015    normal colonoscopy 04/2015. GI workup several times   . BPH associated with nocturia 01/12/2015  . Back pain with right-sided sciatica 09/02/2013  . GERD (gastroesophageal reflux disease) 02/18/2011    Prilosec 40 mg     . Essential hypertension 11/27/2009    Due to availability-losartan 100 mg, hydrochlorothiazide 12.5 mg  trialed losartan alone due to gout but caused edema and BP uncontrolled.    . Obstructive sleep apnea 10/29/2009    CPAP 13   . Morbid obesity (HCC) 04/02/2009  . Gout 01/25/2007    Stopped his Allopurinol 300 mg- darkened stool, chronic stomach upset.  But trying to restart this as wants to be on hctz for BP  Tried off HCTZ but BP up, edema increased- responds well to diuretic.    Marland Kitchen. ERECTILE DYSFUNCTION, ORGANIC 01/25/2007    Viagra when necessary     Social History   Occupational History  . Occupation: Chief of StaffMachine Operator    Employer: [employer]  Tobacco Use  . Smoking status: Former Smoker    Packs/day: 1.00    Years: 20.00    Pack years: 20.00    Types: Cigarettes    Last attempt to quit: 05/17/1991    Years since quitting: 27.1  . Smokeless tobacco: Never Used  Substance and Sexual Activity  . Alcohol use: Yes    Alcohol/week: 2.0 standard drinks    Types: 2 Standard drinks or equivalent per week    Comment: Social use   . Drug use: No  . Sexual activity: Yes   Social History   Social History Narrative   Married- lost wife Isabelle CourseLydia Dec 2017n liver transplant HCV- he was tested and negative. 2 children prior  marriage.       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       Hobbies: out to eat, shopping   Volleyball, cycling, basketball in past.    Family history researc     OBJECTIVE:  VS:  HT:6' (182.9 cm)   WT:(!) 328 lb (148.8 kg)  BMI:44.48    BP:122/84  HR:87bpm  TEMP: ( )  RESP:94 %   PHYSICAL EXAM: Adult male. No acute distress.  Alert and appropriate. Cervical range of motion is limited.  He has no significant pain with Spurling's compression test and Lhermitte's compression test.  No significant pain over the trigger finger of the middle finger of the left hand although he does still have some remaining triggering.  This is minimal.    ASSESSMENT:   1. Trigger middle finger of left hand   2. Plantar fasciitis   3. Cervical radiculopathy   4. Cervical arthritis     PROCEDURES:  None  PLAN:  Pertinent additional documentation may be included in corresponding procedure notes, imaging studies, problem based documentation and patient instructions.  No problem-specific Assessment & Plan notes found for this encounter.   He has improved with this.  We will send in a prescription for Pennsaid for him to use given the slight discomfort and triggering it remains.  He would like to defer any other intervention at this time.  Otherwise please see instructions per AVS for cervical traction pillow and plantar fasciitis.   Meds ordered this encounter  Medications  . Diclofenac Sodium (PENNSAID) 2 % SOLN    Sig: Place 1 application onto the skin 2 (two) times daily.    Dispense:  112 g    Refill:  2    Home Phone      [phone] Mobile          [phone]   . Diclofenac Sodium (PENNSAID) 2 % SOLN    Sig: Place 1 application onto the skin 2 (two) times daily for 1 day.    Dispense:  8 g    Refill:  0     Return if symptoms worsen or fail to improve.          Andrena MewsMichael D Alie Hardgrove, DO    [facility] Physician

## 2018-05-11 NOTE — Patient Instructions (Addendum)
Try using Tumeric - 500mg  by mouth twice daily - it is a good natural anti-inflammatory  For your plantar fasciitis try adding in the heel drops where you are standing on a step.  Try doing 15 of these with your knee bent and your knee straight.   Neck Cervical Traction Collar, Inflatable Dr. Berline Choughigby recommends that you look into using an over the counter cervical traction pillow.  This can be a very effective self-help tool for certain individuals with neck issues.   The pillow should be used for 10-15 minutes at a time up to 3-4 times per day.   We do not recommend sleeping in this device however it can be effectively used as a travel pillow.   For general use, you should inflate it until you feel a gentle pull and some relief of the pressure you are experiencing in your neck and/or upper back.  There are many versions available online through both [place] and Eb[employer].  You can typically find one that costs less than $15.  The below is a clickable link (through MyChart) to what is available on [employer]. Neck Traction Oh[place]u Neck Cervical Traction Collar Device for Neck and Back Pain Relief, Inflatable Spine Alignment Pillow, Grey   by Arlina Robeshuhu Direct Learn more: AlmostHot.glhttps://www.amazon.com/dp/B07G5Y1J2K/ref=cm_sw_em_r_mt_dp_U_8FXwCb7YVV0HY   Instructions for Duexis, Pennsaid and Vimovo:  Your prescription will be filled through a participating [facility] mail order pharmacy.  You will receive a phone call or text from one of the participating pharmacies which can be located in any state in the [place].  You must communicate directly with them to have this medication filled.  When the pharmacy contacts you, they will need your mailing address (for shipment of the medication) andy they will need payment information if you have a copay (typically no more than $10). If you have not heard from them 2-3 days after your appointment with Dr. Berline Choughigby, contact [facility] directly at [phone].   Pennsaid  instructions: You have been given a sample/prescription for Pennsaid, a topical medication.     You are to apply this gel to your injured body part twice daily (morning and evening).   A little goes a long way so you can use about a pea-sized amount for each area.   Spread this small amount over the area into a thin film and let it dry.   Be sure that you do not rub the gel into your skin for more than 10 or 15 seconds otherwise it can irritate you skin.    Once you apply the gel, please do not put any other lotion or clothing in contact with that area for 30 minutes to allow the gel to absorb into your skin.   Some people are sensitive to the medication and can develop a sunburn-like rash.  If you have only mild symptoms it is okay to continue to use the medication but if you have any breakdown of your skin you should discontinue its use and please let us know.   If you have been written a prescription for Pennsaid, you will receive a pump bottle of this topical gel through a mail order pharmacy.  The instructions on the bottle will say to apply two pumps twice a day which may be too much gel for your particular area so use the pea-sized amount as your guide.

## 2018-05-21 ENCOUNTER — Encounter: Payer: Self-pay | Admitting: Family Medicine

## 2018-05-21 ENCOUNTER — Ambulatory Visit: Payer: BLUE CROSS/BLUE SHIELD | Admitting: Family Medicine

## 2018-05-21 VITALS — BP 124/82 | HR 77 | Temp 98.4°F | Ht 72.0 in | Wt 331.0 lb

## 2018-05-21 DIAGNOSIS — J329 Chronic sinusitis, unspecified: Secondary | ICD-10-CM

## 2018-05-21 DIAGNOSIS — B9689 Other specified bacterial agents as the cause of diseases classified elsewhere: Secondary | ICD-10-CM

## 2018-05-21 DIAGNOSIS — I10 Essential (primary) hypertension: Secondary | ICD-10-CM | POA: Diagnosis not present

## 2018-05-21 DIAGNOSIS — I872 Venous insufficiency (chronic) (peripheral): Secondary | ICD-10-CM | POA: Insufficient documentation

## 2018-05-21 MED ORDER — LOSARTAN POTASSIUM 100 MG PO TABS: 100.0000 mg | ORAL_TABLET | Freq: Every day | ORAL | 3 refills | Status: DC

## 2018-05-21 MED ORDER — AMOXICILLIN-POT CLAVULANATE 875-125 MG PO TABS: 1.0000 | ORAL_TABLET | Freq: Two times a day (BID) | ORAL | 0 refills | Status: AC

## 2018-05-21 MED ORDER — OMEPRAZOLE 40 MG PO CPDR: DELAYED_RELEASE_CAPSULE | ORAL | 3 refills | Status: DC

## 2018-05-21 MED ORDER — ALLOPURINOL 300 MG PO TABS: 300.0000 mg | ORAL_TABLET | Freq: Every day | ORAL | 3 refills | Status: DC

## 2018-05-21 MED ORDER — HYDROCHLOROTHIAZIDE 12.5 MG PO TABS: 12.5000 mg | ORAL_TABLET | Freq: Every day | ORAL | 3 refills | Status: DC

## 2018-05-21 NOTE — Assessment & Plan Note (Signed)
S: Patient with what appeared to be inflamed venous stasis skin changes last visit-we trialed triamcinolone cream for the legs with plans to start compression stockings after that.  He has vaseline intensive care but he uses this sparingly.  A/P: Appears better than last visit-but he would like further improvement.  I encouraged patient to use his Vaseline intensive care on a twice daily basis particularly during the winter.  He can do 7 days of triamcinolone right now and then cover with Vaseline intensive care.    We also discussed in the long run importance of wearing compression stockings as well as weight loss.  Patient is going to try to get back on his walking program-this was derailed by girlfriend needing stents and then him getting sick while in the hospital trying to care for her

## 2018-05-21 NOTE — Progress Notes (Signed)
Subjective:  Ross BabeCharles J Jolliff Jr. is a 63 y.o. year old very pleasant male patient who presents for/with See problem oriented charting ROS-itching on lower legs.  Stable edema.  No chest pain or shortness of breath reported.  Does have sinus congestion and pressure.  Past Medical History-  Patient Active Problem List   Diagnosis Date Noted  . Cervical arthritis 05/26/2017    Priority: Medium  . Elevated blood sugar 08/10/2015    Priority: Medium  . BPH associated with nocturia 01/12/2015    Priority: Medium  . Essential hypertension 11/27/2009    Priority: Medium  . Obstructive sleep apnea 10/29/2009    Priority: Medium  . Gout 01/25/2007    Priority: Medium  . Diarrhea 01/27/2015    Priority: Low  . Back pain with right-sided sciatica 09/02/2013    Priority: Low  . GERD (gastroesophageal reflux disease) 02/18/2011    Priority: Low  . Morbid obesity (HCC) 04/02/2009    Priority: Low  . ERECTILE DYSFUNCTION, ORGANIC 01/25/2007    Priority: Low  . Venous stasis dermatitis 05/21/2018  . Plantar fasciitis 05/11/2018  . Trigger middle finger of left hand 03/31/2018    Medications- reviewed and updated Current Outpatient Medications  Medication Sig Dispense Refill  . allopurinol (ZYLOPRIM) 300 MG tablet Take 1 tablet (300 mg total) by mouth daily. 90 tablet 3  . aspirin 81 MG tablet Take 81 mg by mouth daily.      . colchicine 0.6 MG tablet Take 2 tablets at first sign of gout flare then repeat in 2 hours 1 tablet, take 1 tablet a day until resolved (or 10 days maximum). 90 tablet 3  . Diclofenac Sodium (PENNSAID) 2 % SOLN Place 1 application onto the skin 2 (two) times daily. 112 g 2  . hydrochlorothiazide (HYDRODIURIL) 12.5 MG tablet Take 1 tablet (12.5 mg total) by mouth daily. 90 tablet 3  . losartan (COZAAR) 100 MG tablet Take 1 tablet (100 mg total) by mouth daily. 90 tablet 3  . Multiple Vitamin (MULTIVITAMIN) tablet Take 1 tablet by mouth. Once or twice a week    .  omeprazole (PRILOSEC) 40 MG capsule TAKE 1 CAPSULE BY MOUTH EVERY MORNING. 100 capsule 3  . sildenafil (REVATIO) 20 MG tablet Take 2-5 tablets as needed once every 48 hours for erectile dysfunction 50 tablet 3  . triamcinolone cream (KENALOG) 0.1 % Apply 1 application topically 2 (two) times daily. For 7-10 days maximum on itchy areas on legs 160 g 0  . amoxicillin-clavulanate (AUGMENTIN) 875-125 MG tablet Take 1 tablet by mouth 2 (two) times daily for 7 days. 14 tablet 0   No current facility-administered medications for this visit.     Objective: BP 124/82 (BP Location: Left Arm, Patient Position: Sitting, Cuff Size: Large)   Pulse 77   Temp 98.4 F (36.9 C) (Oral)   Ht 6' (1.829 m)   Wt (!) 331 lb (150.1 kg)   SpO2 96%   BMI 44.89 kg/m  Gen: NAD, resting comfortably CV: RRR no murmurs rubs or gallops Lungs: CTAB no crackles, wheeze, rhonchi Abdomen: soft/nontender/nondistended/normal bowel sounds. No rebound or guarding.  Ext: 1+ pitting edema.  Venous stasis skin changes- perhaps 8 to 10 cm up from sock line there is some scaling and mild excoriation.  Some varicose veins noted. Skin: warm, dry Neuro: grossly normal, moves all extremities  Assessment/Plan:  Bacterial sinusitis  s: Patient was diagnosed 3 weeks ago with bronchitis and bacterial sinusitis- treated with azithromycin. Breathing  and cough are better but still blowing out green/yellow chunks mixed with blood.  Sinus pressure and congestion simply or not improving despite treatment. A/P: I suspect patient had a case of bacterial sinusitis that was resistant to azithromycin-for that reason we will add on Augmentin for a week.  Asked him to update me if not improving further with this regimen   Future Appointments  Date Time Provider Department Center  11/12/2018 11:00 AM Shelva MajesticHunter, Alford Gamero O, MD [facility]  04/01/2019  7:45 AM Sherrie GeorgeMatthews, John D, MD [facility] None  Next visit should be physical  Meds ordered this  encounter  Medications  . amoxicillin-clavulanate (AUGMENTIN) 875-125 MG tablet    Sig: Take 1 tablet by mouth 2 (two) times daily for 7 days.    Dispense:  14 tablet    Refill:  0  . allopurinol (ZYLOPRIM) 300 MG tablet    Sig: Take 1 tablet (300 mg total) by mouth daily.    Dispense:  90 tablet    Refill:  3  . hydrochlorothiazide (HYDRODIURIL) 12.5 MG tablet    Sig: Take 1 tablet (12.5 mg total) by mouth daily.    Dispense:  90 tablet    Refill:  3  . losartan (COZAAR) 100 MG tablet    Sig: Take 1 tablet (100 mg total) by mouth daily.    Dispense:  90 tablet    Refill:  3  . omeprazole (PRILOSEC) 40 MG capsule    Sig: TAKE 1 CAPSULE BY MOUTH EVERY MORNING.    Dispense:  100 capsule    Refill:  3   Return precautions advised.  Tana ConchStephen Albany Winslow, MD

## 2018-05-21 NOTE — Patient Instructions (Addendum)
11/11/2018 or later for physical  No changes today  I am believing for you that 2020 will be the year you get back into regular walking and will not be set back!   Use the vaseline cream twice a day to keep legs hydrated. Can try triamcinolone for a week to calm down the inflammation on lower legs- need 10 day break after using for 7 days

## 2018-05-21 NOTE — Assessment & Plan Note (Signed)
S: controlled on losartan 100 mg and hydrochlorothiazide 12.5 mg- used to be combination pill now separated BP Readings from Last 3 Encounters:  05/21/18 124/82  05/11/18 122/84  04/04/18 126/74  A/P: We discussed blood pressure goal of <140/90. Continue current meds.  We know hydrochlorothiazide is not the best choice due to gout but off of this he had significant worsening of edema and poorly controlled blood pressure.

## 2018-05-30 DIAGNOSIS — G4733 Obstructive sleep apnea (adult) (pediatric): Secondary | ICD-10-CM | POA: Diagnosis not present

## 2018-06-08 DIAGNOSIS — H2513 Age-related nuclear cataract, bilateral: Secondary | ICD-10-CM | POA: Diagnosis not present

## 2018-06-08 DIAGNOSIS — H40053 Ocular hypertension, bilateral: Secondary | ICD-10-CM | POA: Diagnosis not present

## 2018-06-29 DIAGNOSIS — G4733 Obstructive sleep apnea (adult) (pediatric): Secondary | ICD-10-CM | POA: Diagnosis not present

## 2018-07-06 ENCOUNTER — Encounter: Payer: Self-pay | Admitting: Family Medicine

## 2018-07-06 ENCOUNTER — Ambulatory Visit: Payer: BLUE CROSS/BLUE SHIELD | Admitting: Family Medicine

## 2018-07-06 ENCOUNTER — Ambulatory Visit (INDEPENDENT_AMBULATORY_CARE_PROVIDER_SITE_OTHER): Payer: BLUE CROSS/BLUE SHIELD

## 2018-07-06 VITALS — BP 110/60 | HR 85 | Temp 97.7°F | Ht 72.0 in | Wt 333.6 lb

## 2018-07-06 DIAGNOSIS — M47817 Spondylosis without myelopathy or radiculopathy, lumbosacral region: Secondary | ICD-10-CM | POA: Diagnosis not present

## 2018-07-06 DIAGNOSIS — M545 Low back pain, unspecified: Secondary | ICD-10-CM

## 2018-07-06 DIAGNOSIS — G8929 Other chronic pain: Secondary | ICD-10-CM

## 2018-07-06 LAB — POC URINALSYSI DIPSTICK (AUTOMATED)
Bilirubin, UA: NEGATIVE
Blood, UA: NEGATIVE
Glucose, UA: NEGATIVE
Ketones, UA: NEGATIVE
Leukocytes, UA: NEGATIVE
Nitrite, UA: NEGATIVE
PROTEIN UA: NEGATIVE
Spec Grav, UA: >=1.03 — AB (ref 1.010–1.025)
Urobilinogen, UA: 0.2 E.U./dL
pH, UA: 5.5 (ref 5.0–8.0)

## 2018-07-06 NOTE — Patient Instructions (Addendum)
Any constipation?  I am not sure what is causing your pain yet- could be coming from the low back.  Lets get x-rays as well as blood work and urine.  If you have new or worsening symptoms please see [place] back  Please stop by lab before you go If you do not have mychart- we will call you about results within 5 business days of [place] receiving them.  If you have mychart- we will send your results within 3 business days of [place] receiving them.  If abnormal or we want to clarify a result, we will call or mychart you to make sure you receive the message.  If you have questions or concerns or don't hear within 5-7 days, please send [place] a message or call us.

## 2018-07-06 NOTE — Progress Notes (Signed)
Phone [phone]   Subjective:  Ross [name]. is a 63 y.o. year old very pleasant male patient who presents for/with See problem oriented charting ROS-no fever chills.  No nausea vomiting.  No fecal or urinary incontinence.  No leg weakness.  No constipation or hard stool.  Some weak stream but not new- has been present at least a year  Past Medical History-  Patient Active Problem List   Diagnosis Date Noted  . Cervical arthritis 05/26/2017    Priority: Medium  . Elevated blood sugar 08/10/2015    Priority: Medium  . BPH associated with nocturia 01/12/2015    Priority: Medium  . Essential hypertension 11/27/2009    Priority: Medium  . Obstructive sleep apnea 10/29/2009    Priority: Medium  . Gout 01/25/2007    Priority: Medium  . Diarrhea 01/27/2015    Priority: Low  . Back pain with right-sided sciatica 09/02/2013    Priority: Low  . GERD (gastroesophageal reflux disease) 02/18/2011    Priority: Low  . Morbid obesity (HCC) 04/02/2009    Priority: Low  . ERECTILE DYSFUNCTION, ORGANIC 01/25/2007    Priority: Low  . Venous stasis dermatitis 05/21/2018  . Plantar fasciitis 05/11/2018  . Trigger middle finger of left hand 03/31/2018    Medications- reviewed and updated Current Outpatient Medications  Medication Sig Dispense Refill  . allopurinol (ZYLOPRIM) 300 MG tablet Take 1 tablet (300 mg total) by mouth daily. 90 tablet 3  . aspirin 81 MG tablet Take 81 mg by mouth daily.      . colchicine 0.6 MG tablet Take 2 tablets at first sign of gout flare then repeat in 2 hours 1 tablet, take 1 tablet a day until resolved (or 10 days maximum). 90 tablet 3  . Diclofenac Sodium (PENNSAID) 2 % SOLN Place 1 application onto the skin 2 (two) times daily. 112 g 2  . hydrochlorothiazide (HYDRODIURIL) 12.5 MG tablet Take 1 tablet (12.5 mg total) by mouth daily. 90 tablet 3  . losartan (COZAAR) 100 MG tablet Take 1 tablet (100 mg total) by mouth daily. 90 tablet 3  . Multiple  Vitamin (MULTIVITAMIN) tablet Take 1 tablet by mouth. Once or twice a week    . omeprazole (PRILOSEC) 40 MG capsule TAKE 1 CAPSULE BY MOUTH EVERY MORNING. 100 capsule 3  . sildenafil (REVATIO) 20 MG tablet Take 2-5 tablets as needed once every 48 hours for erectile dysfunction 50 tablet 3  . triamcinolone cream (KENALOG) 0.1 % Apply 1 application topically 2 (two) times daily. For 7-10 days maximum on itchy areas on legs 160 g 0   No current facility-administered medications for this visit.      Objective:  BP 110/60 (BP Location: Left Arm, Patient Position: Sitting)   Pulse 85   Temp 97.7 F (36.5 C) (Oral)   Ht 6' (1.829 m)   Wt (!) 333 lb 9.6 oz (151.3 kg)   SpO2 94%   BMI 45.24 kg/m  Gen: NAD, resting comfortably CV: RRR no murmurs rubs or gallops Lungs: CTAB no crackles, wheeze, rhonchi Abdomen: soft/nontender-except for mild to moderate pain with deep palpation in left lower quadrant/nondistended/normal bowel sounds. No rebound or guarding.  MSK: Patient tender to palpation over paraspinous muscles bilaterally but worse on the left.  No midline pain. Ext: no edema Skin: warm, dry Neuro: 5 out of 5 strength bilateral lower extremities.  No saddle anesthesia    Assessment and Plan    Chronic bilateral low back pain without  sciatica - Plan: POCT Urinalysis Dipstick (Automated), DG Lumbar Spine Complete, PSA, Comprehensive metabolic panel, CBC, CBC, Comprehensive metabolic panel, PSA S: getting some deep left lower quadrant achiness. Also aching across the low back- seems to be both sides. Has had infected prostate before and dad with prostate cancer so wanted to have that checked. Pain seems worse on the left.   Thinks it has been going on about 6 weeks at least. Seems to be getting ga little worse. Slight trouble sleeping due to the pain. Denies change in mattress.   In the past when this has happened- felt better with walking after a few weeks. He is planning on engaging in  walking.   He is having good regular bowel movements. Lab Results  Component Value Date   PSA 1.17 11/10/2017   PSA 1.42 10/27/2016   PSA 0.88 07/30/2015  A/P: we will get lumbar films, Cbc, cmp, psa as well as UA for further evaluation. My strongest suspicion is lumbar spine arthritis though radiation to abdomen is atypical. If abdominal pain worsens or persists over next 6 months would consider imaging. No obvious prostatitis on exam and PSA. UA not concerning for UTI. Pain not severe so doubt kidney stone. I have seen some low level diverticulitis in the past over period of time but I still think pain is not significant enough to suggest that.   Future Appointments  Date Time Provider Department Center  11/12/2018 11:00 AM Shelva Majestic, MD [facility]  04/01/2019  7:45 AM Sherrie George, MD [facility] None   Lab/Order associations: Chronic bilateral low back pain without sciatica - Plan: POCT Urinalysis Dipstick (Automated), DG Lumbar Spine Complete, PSA, Comprehensive metabolic panel, CBC, CBC, Comprehensive metabolic panel, PSA, POCT Urinalysis Dipstick (Automated), CANCELED: CBC, CANCELED: Comprehensive metabolic panel, CANCELED: PSA  Time Stamp The duration of face-to-face time during this visit was greater than 25 minutes. Greater than 50% of this time was spent in counseling, explanation of diagnosis, planning of further management, and/or coordination of care including discussing potential causes, discussing reasons for workup, discussing return precautions, reviewing exam findings  Return precautions advised.  Tana Conch, MD

## 2018-07-07 LAB — COMPREHENSIVE METABOLIC PANEL
AG RATIO: 1.8 (calc) (ref 1.0–2.5)
ALT: 29 U/L (ref 9–46)
AST: 21 U/L (ref 10–35)
Albumin: 4.4 g/dL (ref 3.6–5.1)
Alkaline phosphatase (APISO): 63 U/L (ref 35–144)
BUN: 18 mg/dL (ref 7–25)
CO2: 27 mmol/L (ref 20–32)
Calcium: 9.5 mg/dL (ref 8.6–10.3)
Chloride: 103 mmol/L (ref 98–110)
Creat: 1.06 mg/dL (ref 0.70–1.25)
Globulin: 2.4 g/dL (calc) (ref 1.9–3.7)
Glucose, Bld: 124 mg/dL — ABNORMAL HIGH (ref 65–99)
Potassium: 3.6 mmol/L (ref 3.5–5.3)
Sodium: 140 mmol/L (ref 135–146)
Total Bilirubin: 0.7 mg/dL (ref 0.2–1.2)
Total Protein: 6.8 g/dL (ref 6.1–8.1)

## 2018-07-07 LAB — CBC
HEMATOCRIT: 37.9 % — AB (ref 38.5–50.0)
Hemoglobin: 13.4 g/dL (ref 13.2–17.1)
MCH: 31.9 pg (ref 27.0–33.0)
MCHC: 35.4 g/dL (ref 32.0–36.0)
MCV: 90.2 fL (ref 80.0–100.0)
MPV: 12.1 fL (ref 7.5–12.5)
Platelets: 179 10*3/uL (ref 140–400)
RBC: 4.2 10*6/uL (ref 4.20–5.80)
RDW: 12.6 % (ref 11.0–15.0)
WBC: 11.2 10*3/uL — ABNORMAL HIGH (ref 3.8–10.8)

## 2018-07-07 LAB — PSA: PSA: 1 ng/mL (ref <=4.0)

## 2018-07-09 DIAGNOSIS — I10 Essential (primary) hypertension: Secondary | ICD-10-CM | POA: Diagnosis not present

## 2018-07-09 DIAGNOSIS — M109 Gout, unspecified: Secondary | ICD-10-CM | POA: Diagnosis not present

## 2018-07-09 DIAGNOSIS — K219 Gastro-esophageal reflux disease without esophagitis: Secondary | ICD-10-CM | POA: Diagnosis not present

## 2018-07-09 DIAGNOSIS — M545 Low back pain: Secondary | ICD-10-CM | POA: Diagnosis not present

## 2018-07-09 DIAGNOSIS — Z23 Encounter for immunization: Secondary | ICD-10-CM | POA: Diagnosis not present

## 2018-07-16 ENCOUNTER — Ambulatory Visit: Payer: BLUE CROSS/BLUE SHIELD | Admitting: Family Medicine

## 2018-07-16 ENCOUNTER — Encounter: Payer: Self-pay | Admitting: Family Medicine

## 2018-07-16 VITALS — BP 134/80 | HR 78 | Temp 98.1°F | Ht 72.0 in | Wt 334.2 lb

## 2018-07-16 DIAGNOSIS — J329 Chronic sinusitis, unspecified: Secondary | ICD-10-CM | POA: Diagnosis not present

## 2018-07-16 DIAGNOSIS — B9689 Other specified bacterial agents as the cause of diseases classified elsewhere: Secondary | ICD-10-CM

## 2018-07-16 MED ORDER — AMOXICILLIN-POT CLAVULANATE 875-125 MG PO TABS: 1.0000 | ORAL_TABLET | Freq: Two times a day (BID) | ORAL | 0 refills | Status: DC

## 2018-07-16 MED ORDER — IPRATROPIUM BROMIDE 0.06 % NA SOLN: 2.0000 | Freq: Four times a day (QID) | NASAL | 0 refills | Status: DC

## 2018-07-16 NOTE — Patient Instructions (Signed)
Start the atrovent and augmentin.  Please stay well hydrated.  You can take tylenol and/or motrin as needed for low grade fever and pain.  Please let me know if your symptoms worsen or fail to improve.  Take care, Dr Parker  

## 2018-07-16 NOTE — Progress Notes (Signed)
   Chief Complaint:  Ross [name]. is a 63 y.o. male who presents for same day appointment with a chief complaint of sinus congestion.   Assessment/Plan:  Sinusitis Given that symptoms have been persistent for the past week and are worsening, will start course of Augmentin today.  Also start Atrovent nasal spray.  Encouraged good oral hydration.  Continue over-the-counter analgesics as needed.  Discussed reasons to return to care.  Follow-up as needed.     Subjective:  HPI:  Sinus Congestion, acute problem Started about a week ago ago. Worsened over that time.  About a week ago was that the dentist office and consider hygienist had several URI symptoms including sneezing cough.  Thinks that he acquired her infection.  His associated symptoms include sore throat, cough, and body aches. No fevers or chills. Cough is non productive. No treatments tried. No other obvious alleviating or aggravating factors.   ROS: Per HPI  PMH: He reports that he quit smoking about 27 years ago. His smoking use included cigarettes. He has a 20.00 pack-year smoking history. He has never used smokeless tobacco. He reports current alcohol use of about 2.0 standard drinks of alcohol per week. He reports that he does not use drugs.      Objective:  Physical Exam: BP 134/80 (BP Location: Left Arm, Patient Position: Sitting, Cuff Size: Large)   Pulse 78   Temp 98.1 F (36.7 C) (Oral)   Ht 6' (1.829 m)   Wt (!) 334 lb 3.2 oz (151.6 kg)   SpO2 96%   BMI 45.33 kg/m   Gen: NAD, resting comfortably HEENT: TMs with clear effusion.  OP erythematous.  Nasal coast erythematous and boggy bilaterally. CV: Regular rate and rhythm with no murmurs appreciated Pulm: Normal work of breathing, clear to auscultation bilaterally with no crackles, wheezes, or rhonchi      Caleb M. Jimmey Ralph, MD 07/16/2018 4:18 PM

## 2018-07-24 ENCOUNTER — Encounter: Payer: Self-pay | Admitting: Family Medicine

## 2018-07-24 ENCOUNTER — Ambulatory Visit: Payer: BLUE CROSS/BLUE SHIELD | Admitting: Family Medicine

## 2018-07-24 VITALS — BP 110/68 | HR 91 | Temp 98.3°F | Ht 72.0 in | Wt 334.0 lb

## 2018-07-24 DIAGNOSIS — B9689 Other specified bacterial agents as the cause of diseases classified elsewhere: Secondary | ICD-10-CM

## 2018-07-24 DIAGNOSIS — J329 Chronic sinusitis, unspecified: Secondary | ICD-10-CM | POA: Diagnosis not present

## 2018-07-24 MED ORDER — ALBUTEROL SULFATE HFA 108 (90 BASE) MCG/ACT IN AERS: 2.0000 | INHALATION_SPRAY | Freq: Four times a day (QID) | RESPIRATORY_TRACT | 0 refills | Status: DC | PRN

## 2018-07-24 MED ORDER — AZITHROMYCIN 250 MG PO TABS: ORAL_TABLET | ORAL | 0 refills | Status: DC

## 2018-07-24 NOTE — Progress Notes (Signed)
Phone [phone]   Subjective:  Ross Robtoy. is a 63 y.o. year old very pleasant male patient who presents for/with See problem oriented charting ROS-no fever or chills.  Does have sinus pressure and congestion.  Does have chest congestion and wheeze.  Past Medical History-  Patient Active Problem List   Diagnosis Date Noted  . Cervical arthritis 05/26/2017    Priority: Medium  . Elevated blood sugar 08/10/2015    Priority: Medium  . BPH associated with nocturia 01/12/2015    Priority: Medium  . Essential hypertension 11/27/2009    Priority: Medium  . Obstructive sleep apnea 10/29/2009    Priority: Medium  . Gout 01/25/2007    Priority: Medium  . Diarrhea 01/27/2015    Priority: Low  . Back pain with right-sided sciatica 09/02/2013    Priority: Low  . GERD (gastroesophageal reflux disease) 02/18/2011    Priority: Low  . Morbid obesity (HCC) 04/02/2009    Priority: Low  . ERECTILE DYSFUNCTION, ORGANIC 01/25/2007    Priority: Low  . Venous stasis dermatitis 05/21/2018  . Plantar fasciitis 05/11/2018  . Trigger middle finger of left hand 03/31/2018    Medications- reviewed and updated Current Outpatient Medications  Medication Sig Dispense Refill  . allopurinol (ZYLOPRIM) 300 MG tablet Take 1 tablet (300 mg total) by mouth daily. 90 tablet 3  . aspirin 81 MG tablet Take 81 mg by mouth daily.      . colchicine 0.6 MG tablet Take 2 tablets at first sign of gout flare then repeat in 2 hours 1 tablet, take 1 tablet a day until resolved (or 10 days maximum). 90 tablet 3  . Diclofenac Sodium (PENNSAID) 2 % SOLN Place 1 application onto the skin 2 (two) times daily. 112 g 2  . hydrochlorothiazide (HYDRODIURIL) 12.5 MG tablet Take 1 tablet (12.5 mg total) by mouth daily. 90 tablet 3  . ipratropium (ATROVENT) 0.06 % nasal spray Place 2 sprays into both nostrils 4 (four) times daily. 15 mL 0  . losartan (COZAAR) 100 MG tablet Take 1 tablet (100 mg total) by mouth daily. 90  tablet 3  . Multiple Vitamin (MULTIVITAMIN) tablet Take 1 tablet by mouth. Once or twice a week    . omeprazole (PRILOSEC) 40 MG capsule TAKE 1 CAPSULE BY MOUTH EVERY MORNING. 100 capsule 3  . sildenafil (REVATIO) 20 MG tablet Take 2-5 tablets as needed once every 48 hours for erectile dysfunction 50 tablet 3  . albuterol (PROVENTIL HFA;VENTOLIN HFA) 108 (90 Base) MCG/ACT inhaler Inhale 2 puffs into the lungs every 6 (six) hours as needed for wheezing or shortness of breath. 1 Inhaler 0  . azithromycin (ZITHROMAX) 250 MG tablet Take 2 tabs on day 1, then 1 tab daily until finished 6 tablet 0   No current facility-administered medications for this visit.      Objective:  BP 110/68 (BP Location: Left Arm, Patient Position: Sitting)   Pulse 91   Temp 98.3 F (36.8 C) (Oral)   Ht 6' (1.829 m)   Wt (!) 334 lb (151.5 kg)   SpO2 91%   BMI 45.30 kg/m  Gen: NAD, resting comfortably Sinus tenderness noted.  Nasal turbinates erythematous and edematous-particularly on the right side CV: RRR no murmurs rubs or gallops Lungs: Patient with intermittent wheezing.  No crackles or rhonchi Abdomen: soft/nontender/nondistended Ext: no edema Skin: warm, dry    Assessment and Plan   Bacterial sinusitis S: Patient was seen by our office last week-diagnosed with sinusitis  and started on Augmentin.  Treated due to double sickening.  He was having sinus pressure and discharge- he had some improvement on the augmentin that he took. Current symptoms are worse in morning and evenings still- still with a lot of phlegm- feels like it is in his upper chest. Feels like a cough and a tickle. Yesterday morning blew out some yellow/brown but today so far has been clear.   Using sinus irrigation and that seems helpful- twice a day. Just finished antibiotic 2 days ago- has not worsened since being off.   He also complains of some upper chest congestion as well as wheezing. A/P: Sinusitis with only partial remission  on Augmentin-has done well on azithromycin in the past so we opted to retrial that though discussed potential for resistance.  This may also be a case of bronchitis- we will give some albuterol for the wheezing portion.  Azithromycin will give reasonable coverage for atypical bacteria if this is 1 of the 5% cases of bronchitis that are bacterial. From AVS "  Patient Instructions  You have a stubborn sinus infection. You did well with azithromycin in the past- lets retrial that. We considered prednisone - perhaps would do this if symptoms don't clear with azithromycin but want to avoid if possible given potential for weight gain and raising sugars  If this is in fact bronchitis- may take 3-6 weeks to clear no matter what we do. We will trial an inhaler to see if that helps with wheeze and cough  See [place] back for new or worsening symptoms   "  Future Appointments  Date Time Provider Department Center  11/12/2018 11:00 AM Shelva Majestic, MD [facility]  04/01/2019  7:45 AM Sherrie George, MD [facility] None   Lab/Order associations: Bacterial sinusitis  Meds ordered this encounter  Medications  . azithromycin (ZITHROMAX) 250 MG tablet    Sig: Take 2 tabs on day 1, then 1 tab daily until finished    Dispense:  6 tablet    Refill:  0  . albuterol (PROVENTIL HFA;VENTOLIN HFA) 108 (90 Base) MCG/ACT inhaler    Sig: Inhale 2 puffs into the lungs every 6 (six) hours as needed for wheezing or shortness of breath.    Dispense:  1 Inhaler    Refill:  0    Return precautions advised.  Tana Conch, MD

## 2018-07-24 NOTE — Patient Instructions (Addendum)
You have a stubborn sinus infection. You did well with azithromycin in the past- lets retrial that. We considered prednisone - perhaps would do this if symptoms don't clear with azithromycin but want to avoid if possible given potential for weight gain and raising sugars  If this is in fact bronchitis- may take 3-6 weeks to clear no matter what we do. We will trial an inhaler to see if that helps with wheeze and cough  See [place] back for new or worsening symptoms

## 2018-08-07 ENCOUNTER — Other Ambulatory Visit: Payer: Self-pay | Admitting: Family Medicine

## 2018-08-08 ENCOUNTER — Ambulatory Visit: Payer: Self-pay | Admitting: *Deleted

## 2018-08-08 NOTE — Telephone Encounter (Signed)
Patient is calling to let Dr Durene Cal know that he is having fatigue and low grade temperature. Patient states he has been careful about exposure- but he is very concerned about his under lying medical condition and history of sinus infection/persistant cough. Patient states he did have some chest symptoms last night with trouble sleeping. Reviewed COVID-19 protocol- monitor/treat symptoms and if breathing symptoms should get worse he needs to go to ED. He voices understanding. Told patient I would let Dr hunter know about his symptoms and see if ha has any other advisement for him at this time.  Reason for Disposition . [1] Fever AND [2] no signs of serious infection or localizing symptoms (all other triage questions negative)  Additional Information . Commented on: [1] Fever or feeling feverish AND [2] within 14 Days of COVID-19 EXPOSURE (Close Contact)    Low grade fever, dry cough- beginning to have possible respiratory symptoms. No known exposure  Answer Assessment - Initial Assessment Questions 1. TEMPERATURE: "What is the most recent temperature?"  "How was it measured?"      99.8, oral thermometer 2. ONSET: "When did the fever start?"      Today- patient started feeling tired yesterday- fatigued today 3. SYMPTOMS: "Do you have any other symptoms besides the fever?"  (e.g., colds, headache, sore throat, earache, cough, rash, diarrhea, vomiting, abdominal pain)     Fatigue, dry cough, feel more labored with breathing- trouble sleeping last night- using CPAP 4. CAUSE: If there are no symptoms, ask: "What do you think is causing the fever?"      Possible viral changes 5. CONTACTS: "Does anyone else in the family have an infection?"     Patient has been passing sinus infection around for months 6. TREATMENT: "What have you done so far to treat this fever?" (e.g., medications)     Nothing so far 7. IMMUNOCOMPROMISE: "Do you have of the following: diabetes, HIV positive, splenectomy, cancer  chemotherapy, chronic steroid treatment, transplant patient, etc."     Patient is concerned about his current health issues 8. PREGNANCY: "Is there any chance you are pregnant?" "When was your last menstrual period?"     n/a 9. TRAVEL: "Have you traveled out of the country in the last month?" (e.g., travel history, exposures)     No travel  Protocols used: FEVER-A-AH, CORONAVIRUS (COVID-19) EXPOSURE-A-AH

## 2018-08-08 NOTE — Telephone Encounter (Signed)
See note

## 2018-08-09 ENCOUNTER — Telehealth: Payer: Self-pay | Admitting: Family Medicine

## 2018-08-09 ENCOUNTER — Encounter: Payer: Self-pay | Admitting: Family Medicine

## 2018-08-09 ENCOUNTER — Ambulatory Visit (INDEPENDENT_AMBULATORY_CARE_PROVIDER_SITE_OTHER): Payer: BLUE CROSS/BLUE SHIELD | Admitting: Family Medicine

## 2018-08-09 VITALS — BP 152/92 | HR 88 | Temp 97.6°F | Wt 322.0 lb

## 2018-08-09 DIAGNOSIS — R5383 Other fatigue: Secondary | ICD-10-CM | POA: Diagnosis not present

## 2018-08-09 DIAGNOSIS — R05 Cough: Secondary | ICD-10-CM

## 2018-08-09 DIAGNOSIS — I10 Essential (primary) hypertension: Secondary | ICD-10-CM

## 2018-08-09 NOTE — Telephone Encounter (Signed)
Patient unable to make contact via webex for appointment with Dr. Durene Cal. Conferenced in to office.

## 2018-08-09 NOTE — Patient Instructions (Addendum)
Phone visit.

## 2018-08-09 NOTE — Assessment & Plan Note (Signed)
S: controlled poorly today-patient admits he forgot to take his medicine so far today-see above medications BP Readings from Last 3 Encounters:  08/09/18 (!) 152/88  08/09/18 (!) 152/92  07/24/18 110/68  A/P: Advised patient to take his medication consistently and let us know if blood pressure remains high in coming days

## 2018-08-09 NOTE — Telephone Encounter (Signed)
Maddy please advise

## 2018-08-09 NOTE — Telephone Encounter (Signed)
Patient has been called and resent email.

## 2018-08-09 NOTE — Telephone Encounter (Signed)
Spoke to pt and he stated that the [facility] nurse advised him to go to the ED if sx persist or worsen. Pt stated that his last temp was  of 99.8 and has taken Tylenol and rested. Pt stated that he is til feeling weak and has 2 weeks sx of sinus infection. Pt claimed he is still recovering from bronchitis. Pt has a cough but denies chills. PT was advised again that if having increase fever, chills or nausea to seek help immediately. Pt was also told to self monitor and self quarantine. Pt verbalized understanding. Pt has been set up with a web visit. Pt does not have an e-mail so he will use someone else.  [email or web address] Front desk notified.

## 2018-08-09 NOTE — Progress Notes (Signed)
Phone [phone]   Subjective:  Virtual visit via phone  Our team/I connected with Ross [name]. on 08/09/18 at  2:20 PM EDT by phone (patient did not have equipment for webex) and verified that I am speaking with the correct person using two identifiers.  Location patient: [facility] Location provider: [facility], office Persons participating in the virtual visit:  Patient  Time on phone with patient: 16 minutes  Our team/I discussed the limitations of evaluation and management by telemedicine and the availability of in person appointments. In light of current covid-19 pandemic, patient also understands that we are trying to protect them by minimizing in office contact if at all possible.  The patient expressed consent for telemedicine visit and agreed to proceed.   ROS- no chest pain- other than some soreness with coughing. No measured fever- peak 99.6.  Feels mildly winded.   Past Medical History-  Patient Active Problem List   Diagnosis Date Noted  . Cervical arthritis 05/26/2017    Priority: Medium  . Elevated blood sugar 08/10/2015    Priority: Medium  . BPH associated with nocturia 01/12/2015    Priority: Medium  . Essential hypertension 11/27/2009    Priority: Medium  . Obstructive sleep apnea 10/29/2009    Priority: Medium  . Gout 01/25/2007    Priority: Medium  . Diarrhea 01/27/2015    Priority: Low  . Back pain with right-sided sciatica 09/02/2013    Priority: Low  . GERD (gastroesophageal reflux disease) 02/18/2011    Priority: Low  . Morbid obesity ([place]) 04/02/2009    Priority: Low  . ERECTILE DYSFUNCTION, ORGANIC 01/25/2007    Priority: Low  . Venous stasis dermatitis 05/21/2018  . Plantar fasciitis 05/11/2018  . Trigger middle finger of left hand 03/31/2018    Medications- reviewed and updated Current Outpatient Medications  Medication Sig Dispense Refill  . albuterol (PROVENTIL HFA;VENTOLIN HFA) 108 (90 Base) MCG/ACT inhaler Inhale 2 puffs into  the lungs every 6 (six) hours as needed for wheezing or shortness of breath. 1 Inhaler 0  . allopurinol (ZYLOPRIM) 300 MG tablet Take 1 tablet (300 mg total) by mouth daily. 90 tablet 3  . aspirin 81 MG tablet Take 81 mg by mouth daily.      . colchicine 0.6 MG tablet Take 2 tablets at first sign of gout flare then repeat in 2 hours 1 tablet, take 1 tablet a day until resolved (or 10 days maximum). 90 tablet 3  . Diclofenac Sodium (PENNSAID) 2 % SOLN Place 1 application onto the skin 2 (two) times daily. 112 g 2  . hydrochlorothiazide (HYDRODIURIL) 12.5 MG tablet Take 1 tablet (12.5 mg total) by mouth daily. 90 tablet 3  . ipratropium (ATROVENT) 0.06 % nasal spray PLACE 2 SPRAYS INTO BOTH NOSTRILS 4 (FOUR) TIMES DAILY. 15 mL 0  . losartan (COZAAR) 100 MG tablet Take 1 tablet (100 mg total) by mouth daily. 90 tablet 3  . Multiple Vitamin (MULTIVITAMIN) tablet Take 1 tablet by mouth. Once or twice a week    . omeprazole (PRILOSEC) 40 MG capsule TAKE 1 CAPSULE BY MOUTH EVERY MORNING. 100 capsule 3  . sildenafil (REVATIO) 20 MG tablet Take 2-5 tablets as needed once every 48 hours for erectile dysfunction 50 tablet 3   No current facility-administered medications for this visit.      Objective:  BP (!) 152/92   Pulse 88   Temp 97.6 F (36.4 C) (Oral)   Wt (!) 322 lb (146.1 kg)  BMI 43.67 kg/m  No obvious distress by phone Nonlabored breathing Normal speech    Assessment and Plan   #Fatigue, cough S:  For the last 2 days has felt very fatigued. Feels slightly better today. Yesterday was running temperature to 99.6- higher than his normal. Feels mildly winded- but has been through 2 rounds of sinus infection recently. Has lingering cough from when I saw him most recently. We had discussed this could be bronchitis.   He felt better on the azithromycin- cough seemed to ease up a bit. Was having some chest tightness but it felt like things would break up once he was on azithromycin.   No  known contacts with covid 19. Son is going to grocery store and leaving supplies in garage.  A/P: We discussed last visit possible bronchitis-I think that is the cause of his ongoing symptoms.  We discussed possible total duration of 4 to 6 weeks- in light of current coronavirus pandemic-we discussed watching for symptoms such as shortness of breath.  Recommended he follow current guidelines for self quarantine as well  As of 08/04/2018 [facility] is no longer recommending that isolation orders be routinely issued for all confirmed cases. All persons with fever and respiratory symptoms (including those with laboratory-confirmed COVID-19) should isolate themselves until the below conditions are met: o At least 7 days since symptom onset and o ?72 hours after symptom resolution (absence of fever without the use of fever-reducing medication and improvement in respiratory symptoms)  . [facility] is no longer recommending that quarantine orders be routinely issued for close contacts to confirmed cases. Persons who have had close contact with a person with respiratory illness are encouraged to stay home to the extent possible and monitor themselves for symptoms.   I am going to plan on checking on him on Monday to see how he is doing  Current guidelines recommend patients self isolate o At least 7 days since symptom onset and o ?72 hours after symptom resolution (absence of fever without the use of fever-reducing medication and improvement in respiratory symptoms)  Future Appointments  Date Time Provider [place]  11/12/2018 11:00 AM Marin Olp, MD [facility]  04/01/2019  7:45 AM Hayden Pedro, MD [facility] None    Return precautions advised.  Garret Reddish, MD

## 2018-08-09 NOTE — Progress Notes (Addendum)
duplicate

## 2018-08-09 NOTE — Telephone Encounter (Signed)
FYI Pt also express concerns of possible Covid-19 but denies traveling and possible contact with anyone who's traveled. Pt has been scheduled for this afternoon for web visit.

## 2018-08-09 NOTE — Telephone Encounter (Signed)
Pt states he has a video appt with Dr. Durene Cal and has not received instructions via email. Wanting to verify Email was given correctly: [email or web address]

## 2018-08-10 NOTE — Addendum Note (Signed)
Addended by: Shelva Majestic on: 08/10/2018 08:06 AM   Modules accepted: Level of Service

## 2018-08-10 NOTE — Progress Notes (Signed)
There was an email sent yesterday with this new feature. I will email it to you now.

## 2018-08-16 ENCOUNTER — Other Ambulatory Visit: Payer: Self-pay | Admitting: Family Medicine

## 2018-09-18 ENCOUNTER — Other Ambulatory Visit: Payer: Self-pay | Admitting: Family Medicine

## 2018-09-18 DIAGNOSIS — R05 Cough: Secondary | ICD-10-CM

## 2018-09-18 DIAGNOSIS — R053 Chronic cough: Secondary | ICD-10-CM

## 2018-11-12 ENCOUNTER — Encounter: Payer: BLUE CROSS/BLUE SHIELD | Admitting: Family Medicine

## 2019-03-10 ENCOUNTER — Other Ambulatory Visit: Payer: Self-pay | Admitting: Family Medicine

## 2019-03-10 DIAGNOSIS — R05 Cough: Secondary | ICD-10-CM

## 2019-03-10 DIAGNOSIS — R053 Chronic cough: Secondary | ICD-10-CM

## 2019-03-25 ENCOUNTER — Other Ambulatory Visit: Payer: Self-pay

## 2019-03-25 ENCOUNTER — Encounter: Payer: Self-pay | Admitting: Family Medicine

## 2019-03-25 ENCOUNTER — Encounter: Payer: Self-pay | Admitting: *Deleted

## 2019-03-25 ENCOUNTER — Ambulatory Visit (INDEPENDENT_AMBULATORY_CARE_PROVIDER_SITE_OTHER): Payer: Medicare Other | Admitting: *Deleted

## 2019-03-25 DIAGNOSIS — Z23 Encounter for immunization: Secondary | ICD-10-CM | POA: Diagnosis not present

## 2019-03-25 NOTE — Progress Notes (Signed)
Per orders of Dr. Yong Channel, injection of Prevnar 13 0.5 ml  given by Anselmo Pickler in right deltoid. Patient tolerated injection well.

## 2019-03-26 ENCOUNTER — Ambulatory Visit (INDEPENDENT_AMBULATORY_CARE_PROVIDER_SITE_OTHER): Payer: Medicare Other | Admitting: Pulmonary Disease

## 2019-03-26 ENCOUNTER — Encounter: Payer: Self-pay | Admitting: Pulmonary Disease

## 2019-03-26 DIAGNOSIS — G4733 Obstructive sleep apnea (adult) (pediatric): Secondary | ICD-10-CM | POA: Diagnosis not present

## 2019-03-26 NOTE — Assessment & Plan Note (Signed)
Weight loss encouraged 

## 2019-03-26 NOTE — Assessment & Plan Note (Signed)
Events are well controlled on CPAP 13 cm and he is very compliant. He has noted much improvement in daytime somnolence and fatigue due to CPAP He does have a large leak and we discussed possibility of using an AirFit F30 mask in the future  Weight loss encouraged, compliance with goal of at least 4-6 hrs every night is the expectation. Advised against medications with sedative side effects Cautioned against driving when sleepy - understanding that sleepiness will vary on a day to day basis

## 2019-03-26 NOTE — Progress Notes (Signed)
   Subjective:    Patient ID: Ross [name]., male    DOB: [birth year], 63 y.o.   MRN: [id number]  HPI  I connected with  Ross [name]. on 03/26/19 by phone and verified that I am speaking with the correct person using two identifiers.   I discussed the limitations of evaluation and management by telemedicine. The patient expressed understanding and agreed to proceed.     63  yo for FU ofsevere OSA   He has been maintained on CPAP now for several years and this is certainly helped improve his daytime somnolence and fatigue. He denies any problems with his full facemask or pressure. He is very compliant. Download was reviewed and compliance was objectively confirmed, no residual events on 13 cm, large leak-but this does not seem to wake him up.  He does not have significant dryness.  He has gained weight, unquantified amount since the pandemic started. He is masking and social distancing.  He had lost his employer insurance and had to go on COBRA and was relying on supplies that he had accumulated over the past year but now he is on Medicare and is able to get supplies again through [place] tests/ events reviewed  PSG 2011 AHI 19/hr  Home sleep study January 2017 with AHI of 42.4/hr , low sat 61% 4/2017CPAP >>13 cm of H2O.  Review of Systems neg for any significant sore throat, dysphagia, itching, sneezing, nasal congestion or excess/ purulent secretions, fever, chills, sweats, unintended wt loss, pleuritic or exertional cp, hempoptysis, orthopnea pnd or change in chronic leg swelling. Also denies presyncope, palpitations, heartburn, abdominal pain, nausea, vomiting, diarrhea or change in bowel or urinary habits, dysuria,hematuria, rash, arthralgias, visual complaints, headache, numbness weakness or ataxia.     Objective:   Physical Exam  televisit      Assessment & Plan:    Total time x 21 mins

## 2019-04-01 ENCOUNTER — Encounter (INDEPENDENT_AMBULATORY_CARE_PROVIDER_SITE_OTHER): Payer: BLUE CROSS/BLUE SHIELD | Admitting: Ophthalmology

## 2019-04-18 ENCOUNTER — Other Ambulatory Visit: Payer: Self-pay

## 2019-04-19 ENCOUNTER — Ambulatory Visit (INDEPENDENT_AMBULATORY_CARE_PROVIDER_SITE_OTHER): Payer: Medicare Other | Admitting: Family Medicine

## 2019-04-19 ENCOUNTER — Encounter: Payer: Self-pay | Admitting: Family Medicine

## 2019-04-19 VITALS — BP 136/82 | HR 85 | Temp 98.7°F | Ht 72.0 in | Wt 337.0 lb

## 2019-04-19 DIAGNOSIS — R739 Hyperglycemia, unspecified: Secondary | ICD-10-CM

## 2019-04-19 DIAGNOSIS — N401 Enlarged prostate with lower urinary tract symptoms: Secondary | ICD-10-CM

## 2019-04-19 DIAGNOSIS — M1A00X0 Idiopathic chronic gout, unspecified site, without tophus (tophi): Secondary | ICD-10-CM

## 2019-04-19 DIAGNOSIS — R351 Nocturia: Secondary | ICD-10-CM

## 2019-04-19 DIAGNOSIS — M47812 Spondylosis without myelopathy or radiculopathy, cervical region: Secondary | ICD-10-CM | POA: Diagnosis not present

## 2019-04-19 DIAGNOSIS — I10 Essential (primary) hypertension: Secondary | ICD-10-CM

## 2019-04-19 DIAGNOSIS — M25561 Pain in right knee: Secondary | ICD-10-CM

## 2019-04-19 DIAGNOSIS — R1032 Left lower quadrant pain: Secondary | ICD-10-CM

## 2019-04-19 DIAGNOSIS — R1031 Right lower quadrant pain: Secondary | ICD-10-CM

## 2019-04-19 MED ORDER — ALLOPURINOL 300 MG PO TABS: 300.0000 mg | ORAL_TABLET | Freq: Every day | ORAL | 3 refills | Status: DC

## 2019-04-19 MED ORDER — LOSARTAN POTASSIUM-HCTZ 100-12.5 MG PO TABS: 1.0000 | ORAL_TABLET | Freq: Every day | ORAL | 3 refills | Status: DC

## 2019-04-19 MED ORDER — OMEPRAZOLE 40 MG PO CPDR: DELAYED_RELEASE_CAPSULE | ORAL | 3 refills | Status: DC

## 2019-04-19 NOTE — Patient Instructions (Addendum)
For his right knee- seems to be improving. May have some inflammation in pes anserine bursa- he is going to try icing to see if it will help more than heat- he can also use aleve as needed  No obvious prostate infection and your risk of prostate cancer is low based on prior PSA checks  Recommended follow up: Return in about 6 months (around 10/18/2019) for physical or sooner if needed.

## 2019-04-19 NOTE — Progress Notes (Signed)
Phone [phone] In person visit   Subjective:   Ross [name]. is a 63 y.o. year old very pleasant male patient who presents for/with See problem oriented charting Chief Complaint  Patient presents with  . Follow-up  . knee pain    ROS-does have some right knee pain.  Intermittent groin pain.  No fever or chill.  No cough congestion.  This visit occurred during the SARS-CoV-2 public health emergency.  Safety protocols were in place, including screening questions prior to the visit, additional usage of staff PPE, and extensive cleaning of exam room while observing appropriate contact time as indicated for disinfecting solutions.   Past Medical History-  Patient Active Problem List   Diagnosis Date Noted  . Cervical arthritis 05/26/2017    Priority: Medium  . Elevated blood sugar 08/10/2015    Priority: Medium  . BPH associated with nocturia 01/12/2015    Priority: Medium  . Essential hypertension 11/27/2009    Priority: Medium  . Obstructive sleep apnea 10/29/2009    Priority: Medium  . Gout 01/25/2007    Priority: Medium  . Diarrhea 01/27/2015    Priority: Low  . Back pain with right-sided sciatica 09/02/2013    Priority: Low  . GERD (gastroesophageal reflux disease) 02/18/2011    Priority: Low  . Morbid obesity ([place]) 04/02/2009    Priority: Low  . ERECTILE DYSFUNCTION, ORGANIC 01/25/2007    Priority: Low  . Venous stasis dermatitis 05/21/2018  . Plantar fasciitis 05/11/2018  . Trigger middle finger of left hand 03/31/2018    Medications- reviewed and updated Current Outpatient Medications  Medication Sig Dispense Refill  . allopurinol (ZYLOPRIM) 300 MG tablet Take 1 tablet (300 mg total) by mouth daily. 90 tablet 3  . aspirin 81 MG tablet Take 81 mg by mouth daily.      . colchicine 0.6 MG tablet Take 2 tablets at first sign of gout flare then repeat in 2 hours 1 tablet, take 1 tablet a day until resolved (or 10 days maximum). 90 tablet 3  . Diclofenac  Sodium (PENNSAID) 2 % SOLN Place 1 application onto the skin 2 (two) times daily. 112 g 2  . ipratropium (ATROVENT) 0.06 % nasal spray PLACE 2 SPRAYS INTO BOTH NOSTRILS 4 (FOUR) TIMES DAILY. 15 mL 0  . losartan-hydrochlorothiazide (HYZAAR) 100-12.5 MG tablet Take 1 tablet by mouth daily. 90 tablet 3  . Multiple Vitamin (MULTIVITAMIN) tablet Take 1 tablet by mouth. Once or twice a week    . omeprazole (PRILOSEC) 40 MG capsule TAKE 1 CAPSULE BY MOUTH EVERY MORNING. 100 capsule 3  . sildenafil (REVATIO) 20 MG tablet Take 2-5 tablets as needed once every 48 hours for erectile dysfunction 50 tablet 3   No current facility-administered medications for this visit.      Objective:  BP 136/82   Pulse 85   Temp 98.7 F (37.1 C)   Ht 6' (1.829 m)   Wt (!) 337 lb (152.9 kg)   SpO2 96%   BMI 45.71 kg/m  Gen: NAD, resting comfortably  CV: RRR  Lungs: nonlabored, normal respiratory rate Abdomen: soft/nondistended  Ext: Trace edema bilaterally Skin: warm, dry MSK: Some pain along right pes anserine bursa without obvious edema or erythema.  Patient with good range of motion of bilateral hips and without groin pain with motion.  Stinchfield negative.    Assessment and Plan  #Right knee Pain/bilateral groin pain S:Pt complains of right knee pain a-his knee has been bothering him for a  month. He has been taking 600mg  of advil with minimum relief  Initially along with a heating pad. Last week swelling and pain started to go down some. Does have some lingering pain around what appears to be pes anserine bursa- doesn't bother him at rest but when he gets up to get moving really bothers him. Patient does have a history of gout and is on allopurinol 300 mg.  Denies the knee on the right ever being hot/red but did get swollen.  No history of recent knee films  Patient also feels a pain in his lateral hips down into bilateral groin/hips. He states it is a pretty regular achiness but severity only mild. Patient  also complains of lower back at times and he is not sure If that's prostate related. Patient does have a history of BPH with nocturia.  He also has a history of right back pain with sciatica.  He complained of low back pain in February and updated x-rays at that time showed multiple level degenerative changes most notably at L4-L5.  He denies symptoms being worse since that time. A/P: For his right knee- seems to be improving. May have some inflammation in pes anserine bursa- he is going to try icing to see if it will help more than heat- he can also use aleve as needed.  - could be gout flare possibly but he is compliant with allopurinol (update uric acid next labs)  Patient's main concern with his groin pain/hip pain was if this could be a sign of prostate infection or prostate cancer.  We reviewed prior PSA trend which was low risk in February for prostate cancer.  His rectal exam today was nontender but still mildly enlarged.  We discussed I did not think the probability of prostate cancer was high based on these findings.  His hip exam was reassuring today-could have some mild arthritis causing the groin pain-no obvious hernias noted.  #Gout S: Denies any recent flares on allopurinol 300 mg.  Fortunately he has tolerated this trial of allopurinol-had issues in the past with dark in stool and chronic stomach upset Lab Results  Component Value Date   LABURIC 6.6 10/27/2016  A/P: I do not strongly suspect knee pain or hip pain are related to gout.  We will update a uric acid level at physical which I advised within  6 months.  I suspect uric acid will be under 6  #hypertension S: compliant with losartan hydrochlorothiazide 100-12.5 mg.  He reports good control at home- Reports home numbers can even be in the 120s BP Readings from Last 3 Encounters:  04/19/19 136/82  08/09/18 (!) 152/88  08/09/18 (!) 152/92  A/P:  Stable. Continue current medications.    #Cervical arthritis S:Mild intermittent  right arm numbness laying down.  Ongoing and intermittent issue. A/P: We discussed possible referral to sports medicine or trial of prednisone-patient would like to avoid at this time as he feels his symptoms are rather mild.  His last A1c was also in prediabetes range so he would like to avoid prednisone    # Obesity morbid/hyperglycemia  S: Unfortunately with COVID-19 weight has trended up significantly.  He is not exercising regularly.  He admits to dietary indiscretions-admits to stress eating  Last A1c 6.0 Wt Readings from Last 3 Encounters:  04/19/19 (!) 337 lb (152.9 kg)  08/09/18 (!) 322 lb (146.1 kg)  08/09/18 (!) 322 lb (146.1 kg)   A/P: Poor control of weight and we discussed how this also  increases his risk of COVID-19 complications-he is very concerned about COVID-19 and I counseled him on avoidance measures but also the importance of weight loss to lower his risk if he does contract the disease.  He is hoping to get a vaccine sometime in the next 3 to 6 months-I am hopeful for the same -Encouraged need for healthy eating, regular exercise, weight loss.    Patient also has increased risk of diabetes-healthy eating and regular exercise should help reduce his risk.  He comes back for a physical we should update an A1c.   Recommended follow up: Return in about 6 months (around 10/18/2019) for physical or sooner if needed.  Lab/Order associations:   ICD-10-CM   1. Idiopathic chronic gout without tophus, unspecified site  M1A.00X0   2. Essential hypertension  I10 losartan-hydrochlorothiazide (HYZAAR) 100-12.5 MG tablet  3. Cervical arthritis  M47.812   4. Morbid obesity (HCC)  E66.01   5. Elevated blood sugar  R73.9   6. BPH associated with nocturia  N40.1    R35.1   7. Right knee pain, unspecified chronicity  M25.561   8. Bilateral groin pain  R10.31    R10.32     Meds ordered this encounter  Medications  . omeprazole (PRILOSEC) 40 MG capsule    Sig: TAKE 1 CAPSULE BY  MOUTH EVERY MORNING.    Dispense:  100 capsule    Refill:  3  . losartan-hydrochlorothiazide (HYZAAR) 100-12.5 MG tablet    Sig: Take 1 tablet by mouth daily.    Dispense:  90 tablet    Refill:  3  . allopurinol (ZYLOPRIM) 300 MG tablet    Sig: Take 1 tablet (300 mg total) by mouth daily.    Dispense:  90 tablet    Refill:  3    Return precautions advised.  Tana Conch, MD

## 2019-04-20 NOTE — Assessment & Plan Note (Signed)
S: Denies any recent flares on allopurinol 300 mg.  Fortunately he has tolerated this trial of allopurinol-had issues in the past with dark in stool and chronic stomach upset Lab Results  Component Value Date   LABURIC 6.6 10/27/2016  A/P: I do not strongly suspect knee pain or hip pain are related to gout.  We will update a uric acid level at physical which I advised within  6 months.  I suspect uric acid will be under 6

## 2019-06-20 ENCOUNTER — Ambulatory Visit (INDEPENDENT_AMBULATORY_CARE_PROVIDER_SITE_OTHER): Payer: Medicare Other | Admitting: Family Medicine

## 2019-06-20 ENCOUNTER — Encounter: Payer: Self-pay | Admitting: Family Medicine

## 2019-06-20 VITALS — BP 136/86 | HR 76 | Temp 98.2°F | Ht 72.0 in | Wt 340.8 lb

## 2019-06-20 DIAGNOSIS — H9222 Otorrhagia, left ear: Secondary | ICD-10-CM

## 2019-06-20 DIAGNOSIS — H9202 Otalgia, left ear: Secondary | ICD-10-CM | POA: Diagnosis not present

## 2019-06-20 NOTE — Progress Notes (Signed)
Phone [phone] In person visit   Subjective:   Ross [name]. is a 64 y.o. year old very pleasant male patient who presents for/with See problem oriented charting Chief Complaint  Patient presents with  . Follow-up  . ear pain   This visit occurred during the SARS-CoV-2 public health emergency.  Safety protocols were in place, including screening questions prior to the visit, additional usage of staff PPE, and extensive cleaning of exam room while observing appropriate contact time as indicated for disinfecting solutions.   Past Medical History-  Patient Active Problem List   Diagnosis Date Noted  . Cervical arthritis 05/26/2017    Priority: Medium  . Elevated blood sugar 08/10/2015    Priority: Medium  . BPH associated with nocturia 01/12/2015    Priority: Medium  . Essential hypertension 11/27/2009    Priority: Medium  . Obstructive sleep apnea 10/29/2009    Priority: Medium  . Gout 01/25/2007    Priority: Medium  . Diarrhea 01/27/2015    Priority: Low  . Back pain with right-sided sciatica 09/02/2013    Priority: Low  . GERD (gastroesophageal reflux disease) 02/18/2011    Priority: Low  . Morbid obesity ([place]) 04/02/2009    Priority: Low  . ERECTILE DYSFUNCTION, ORGANIC 01/25/2007    Priority: Low  . Venous stasis dermatitis 05/21/2018  . Plantar fasciitis 05/11/2018  . Trigger middle finger of left hand 03/31/2018    Medications- reviewed and updated Current Outpatient Medications  Medication Sig Dispense Refill  . allopurinol (ZYLOPRIM) 300 MG tablet Take 1 tablet (300 mg total) by mouth daily. 90 tablet 3  . aspirin 81 MG tablet Take 81 mg by mouth daily.      . colchicine 0.6 MG tablet Take 2 tablets at first sign of gout flare then repeat in 2 hours 1 tablet, take 1 tablet a day until resolved (or 10 days maximum). 90 tablet 3  . Diclofenac Sodium (PENNSAID) 2 % SOLN Place 1 application onto the skin 2 (two) times daily. 112 g 2  . ipratropium  (ATROVENT) 0.06 % nasal spray PLACE 2 SPRAYS INTO BOTH NOSTRILS 4 (FOUR) TIMES DAILY. 15 mL 0  . losartan-hydrochlorothiazide (HYZAAR) 100-12.5 MG tablet Take 1 tablet by mouth daily. 90 tablet 3  . Multiple Vitamin (MULTIVITAMIN) tablet Take 1 tablet by mouth. Once or twice a week    . omeprazole (PRILOSEC) 40 MG capsule TAKE 1 CAPSULE BY MOUTH EVERY MORNING. 100 capsule 3  . sildenafil (REVATIO) 20 MG tablet Take 2-5 tablets as needed once every 48 hours for erectile dysfunction 50 tablet 3   No current facility-administered medications for this visit.     Objective:  BP 140/90   Pulse 76   Temp 98.2 F (36.8 C)   Ht 6' (1.829 m)   Wt (!) 340 lb 12.8 oz (154.6 kg)   SpO2 97%   BMI 46.22 kg/m  Gen: NAD, resting comfortably Right TM normal. Left ear canal with excoriation in 1 particular are- appears to have bled from that area with streak of blood down towards tympanic membrane- unable to visualize membrane. Canal appears smaller on left compared to right Neuro: muffled hearing    Assessment and Plan  # Left Ear Pain/blood in ear S: pt c/o left ear pain. States it has been bothering him for about a month. States he ruptured this ear drum 40 years ago. Pt states this morning blood was running out of his ear and it is ringing and feels  like its full of cotton and starting to ache. Pain level rated 1/10.  Does use q tips slightly on inside of year. A few weeks ago felt slightly tender when he used q tips- a few weeks ago notes slight redness A/P:  64 year old male with muffled hearing, cotton sensation, mild pain in left ear with blood in ear canal and inability to see tympanic membrane with history of ruptured membrane on that side. I see an excoriated area in left canal that I suspect is source of bleeding. With that being said I cannot visualize tympanic membrane to make sure its safe to irrigate. I told him I thought it would be risky to irrigate without visualization so we opted to  refer to ENT for their expert opinion/advanced equipment. I do think this likely is all caused by bleeding in the canal that has run up against tympanic membrane but to be safe- refer as above.   -advised strongly no more q tips!   Recommended follow up:  Welcome to [facility] in June  Future Appointments  Date Time Provider Department Center  10/18/2019  1:20 PM Shelva Majestic, MD [facility]    Lab/Order associations:   ICD-10-CM   1. Blood in ear canal, left  H92.22 Ambulatory referral to ENT  2. Left ear pain  H92.02    Return precautions advised.  Tana Conch, MD

## 2019-06-20 NOTE — Patient Instructions (Addendum)
Sit tight- we will see if we can get you into ENT doctor tomorrow or Monday at latest. If worsening symptoms please let us know.

## 2019-06-21 ENCOUNTER — Telehealth: Payer: Self-pay

## 2019-06-21 NOTE — Telephone Encounter (Signed)
Called office app made for Monday patient informed re faxed to office. I have called patient and let know about appointment no questions at this time.

## 2019-06-21 NOTE — Telephone Encounter (Signed)
JoEllen is currently working on getting this scheduled for pt.

## 2019-06-21 NOTE — Telephone Encounter (Signed)
Patient want to check the status of referral because he haven't received a call from ent doctor. Please call patient back

## 2019-06-25 ENCOUNTER — Ambulatory Visit (INDEPENDENT_AMBULATORY_CARE_PROVIDER_SITE_OTHER): Payer: Medicare Other | Admitting: Otolaryngology

## 2019-07-08 ENCOUNTER — Telehealth: Payer: Self-pay

## 2019-07-08 NOTE — Telephone Encounter (Signed)
Patient is having trouble with his knee. Patient states that he didn't fall but when he try to walk or add pressure to his knee it is painful.the only appts that available for this week is same day appt is it okay to add patient one of these slots?

## 2019-07-08 NOTE — Telephone Encounter (Signed)
Return patient call &Patient declined appt with Dr. Durene Cal 07/15/19 . pt states that he has schedule an appt with Sport medicine on 07/10/19.

## 2019-07-08 NOTE — Telephone Encounter (Signed)
Yes, that's fine 

## 2019-07-10 ENCOUNTER — Ambulatory Visit: Payer: Self-pay

## 2019-07-10 ENCOUNTER — Ambulatory Visit (INDEPENDENT_AMBULATORY_CARE_PROVIDER_SITE_OTHER): Payer: Medicare Other | Admitting: Family Medicine

## 2019-07-10 ENCOUNTER — Other Ambulatory Visit: Payer: Self-pay

## 2019-07-10 ENCOUNTER — Ambulatory Visit (INDEPENDENT_AMBULATORY_CARE_PROVIDER_SITE_OTHER): Payer: Medicare Other

## 2019-07-10 ENCOUNTER — Encounter: Payer: Self-pay | Admitting: Family Medicine

## 2019-07-10 VITALS — BP 132/80 | HR 80 | Ht 72.0 in | Wt 338.0 lb

## 2019-07-10 DIAGNOSIS — M25561 Pain in right knee: Secondary | ICD-10-CM | POA: Diagnosis not present

## 2019-07-10 MED ORDER — CELECOXIB 200 MG PO CAPS: ORAL_CAPSULE | ORAL | 2 refills | Status: DC

## 2019-07-10 NOTE — Patient Instructions (Signed)
Thank you for coming in today. Ok to try celebrex.  Ok to take with tylenol.  Recheck with me in about 1 week.  Use the walker as needed.  Let me know sooner if not doing well.  Call or go to the ER if you develop a large red swollen joint with extreme pain or oozing puss.

## 2019-07-10 NOTE — Progress Notes (Signed)
Subjective:    CC: R knee pain  I, Molly Weber, LAT, ATC, am serving as scribe for Dr. Clementeen Graham.  HPI: Pt is a 64 y/o male presenting w/ c/o R knee pain x1 week right above the knee cap. He rates his pain at a 8/10 and describes his pain as aching and when standing it takes his breath away and tender to the touch. Had taken his car in to the shop and realized that the seat was up too much when he went to get out of the car twisted his knee and felt like a deep stretch in the knee. No bruising, but did have swelling. Tried advil, heat, and ice. Is using a walker to ambulate.    Pertinent review of Systems: No fevers or chills.  Relevant historical information: Hypertension, sleep apnea, morbid obesity, gout.   Objective:    Vitals:   07/10/19 1321  BP: 132/80  Pulse: 80  SpO2: 96%   General: Well Developed, well nourished, and in no acute distress.   MSK: Right knee: Large effusion no erythema no obvious deformity. Range of motion 10-70 degrees. Diffusely tender to palpation across the anterior aspect of the knee specifically the quad tendon insertion on the patella.  Nontender lateral and medial joint line. Strength diminished to knee extension due to pain.  Normal strength to flexion. Patient has significant guarding with ligament testing.  Lab and Radiology Results  X-ray images right knee obtained today personally and independently reviewed No acute fractures.  Moderate degenerative changes throughout.  Osteophyte at inferior patellar pole. Await formal radiology review  Diagnostic Limited MSK Ultrasound of: Right knee: Quad tendon appears to be intact.  Moderate joint effusion present. Patellar tendon intact. Medial meniscus and medial joint line narrowed with partial extrusion of medial meniscus with hypoechoic change throughout mid substance of meniscus indicating tear. Lateral joint line narrowed partially extruded meniscus with hypoechoic change in  meniscus indicating tear. No Baker's cyst present posterior knee. Impression: Knee effusion with degenerative changes in knee including probable degenerative meniscus tears.  Procedure: Real-time Ultrasound Guided Injection of right knee Device: Philips Affiniti 50G Images permanently stored and available for review in the ultrasound unit. Verbal informed consent obtained.  Discussed risks and benefits of procedure. Warned about infection bleeding damage to structures skin hypopigmentation and fat atrophy among others. Patient expresses understanding and agreement Time-out conducted.   Noted no overlying erythema, induration, or other signs of local infection.   Skin prepped in a sterile fashion.   Local anesthesia: Topical Ethyl chloride.   With sterile technique and under real time ultrasound guidance:  40 mg of Kenalog and 4 mL of Marcaine injected easily.   Completed without difficulty   Pain moderately resolved suggesting accurate placement of the medication.   Advised to call if fevers/chills, erythema, induration, drainage, or persistent bleeding.   Images permanently stored and available for review in the ultrasound unit.  Impression: Technically successful ultrasound guided injection.          Impression and Recommendations:    Assessment and Plan: 64 y.o. male with right knee pain..  Pain occurred after a twisting injury.  Fortunately no severe ligament injury seen today.  However patient has considerable guarding with exam possibly decreasing the accuracy of these diagnostic musculoskeletal test.  Likely cause of pain is meniscus tear or exacerbation of pre-existing meniscus tear versus activation of DJD.  Plan for steroid injection as above and relative rest.  Limited Celebrex for  pain control.  Check back in about a week we will repeat exam at that point.   Orders Placed This Encounter  Procedures  . DG Knee 4 Views W/Patella Right    Standing Status:   Future     Number of Occurrences:   1    Standing Expiration Date:   09/06/2020    Order Specific Question:   Reason for Exam (SYMPTOM  OR DIAGNOSIS REQUIRED)    Answer:   eval knee pain. Suspect patella avulsion?    Order Specific Question:   Preferred imaging location?    Answer:   [facility]    Order Specific Question:   Radiology Contrast Protocol - do NOT remove file path    Answer:   [email or web address]  . [place] LIMITED JOINT SPACE STRUCTURES LOW RIGHT(NO LINKED CHARGES)    Order Specific Question:   Reason for Exam (SYMPTOM  OR DIAGNOSIS REQUIRED)    Answer:   right knee pain    Order Specific Question:   Preferred imaging location?    Answer:   [place]   Meds ordered this encounter  Medications  . celecoxib (CELEBREX) 200 MG capsule    Sig: One to 2 tablets by mouth daily as needed for pain.    Dispense:  60 capsule    Refill:  2    Discussed warning signs or symptoms. Please see discharge instructions. Patient expresses understanding.   The above documentation has been reviewed and is accurate and complete Lynne Leader

## 2019-07-11 NOTE — Progress Notes (Signed)
Medium arthritis present in the knee.  No fracture visible.

## 2019-07-17 ENCOUNTER — Other Ambulatory Visit: Payer: Self-pay

## 2019-07-17 ENCOUNTER — Encounter: Payer: Self-pay | Admitting: Family Medicine

## 2019-07-17 ENCOUNTER — Ambulatory Visit (INDEPENDENT_AMBULATORY_CARE_PROVIDER_SITE_OTHER): Payer: Medicare Other | Admitting: Family Medicine

## 2019-07-17 VITALS — BP 134/80 | HR 97 | Ht 72.0 in | Wt 338.0 lb

## 2019-07-17 DIAGNOSIS — M25561 Pain in right knee: Secondary | ICD-10-CM | POA: Diagnosis not present

## 2019-07-17 NOTE — Patient Instructions (Signed)
Thank you for coming in today. I am happy that you are feeling better.  Ok to use voltaren gel up to 4x daily for knee pain.  Recheck in about 1 month.  Return sooner or contact me sooner if needed.

## 2019-07-17 NOTE — Progress Notes (Signed)
I, Christoper Fabian, LAT, ATC, am serving as scribe for Dr. Clementeen Graham.  Ross [name]. is a 64 y.o. male who presents to [facility] at [facility] ([place]) today for f/u of R knee pain that began about 2 weeks ago after suffering a twisting injury to his R knee while getting out of his car.  He rates his pain at an 8/10 and reported swelling in his R knee.  He was last seen by Dr. [name] on 07/10/19 and had a R knee injection and was prescribed Celebrex.  Since his last visit, pt reports that he is feeling a lot better. Patient is ambulating without the walker. The pain is medial R knee and back of knee.   Diagnostic imaging: R knee XR- 07/10/19  Pertinent review of systems: No fevers or chills.  Relevant historical information: Hypertension sleep apnea morbid obesity.   Exam:  BP 134/80 (BP Location: Left Arm, Patient Position: Sitting, Cuff Size: Normal)   Pulse 97   Ht 6' (1.829 m)   Wt (!) 338 lb (153.3 kg)   SpO2 98%   BMI 45.84 kg/m  General: Well Developed, well nourished, and in no acute distress.   MSK:: Right knee Moderate effusion otherwise normal-appearing. Range of motion 0-120 degrees with crepitation. Mildly tender palpation medial joint line. Stable ligamentous exam.  No laxity to anterior posterior drawer testing valgus varus stress testing. Mildly positive medial McMurray's test negative laterally. Intact flexion and extension strength.    Lab and Radiology Results DG Knee 4 Views W/Patella Right  Result Date: 07/10/2019 CLINICAL DATA:  Knee pain for 7 days. Twisting injury. Question patellar injury. EXAM: RIGHT KNEE - COMPLETE 4+ VIEW COMPARISON:  The knee is located. FINDINGS: The knee is located. Moderate-sized effusion is present. Degenerative changes are noted in the patellofemoral component. No acute or healing fracture is present. IMPRESSION: 1. Moderate-sized effusion without acute or healing fracture. Internal derangement is not excluded. 2.  Degenerative changes in the patellofemoral component. Electronically Signed   By: Marin Roberts M.D.   On: 07/10/2019 21:23   [place] LIMITED JOINT SPACE STRUCTURES LOW RIGHT(NO LINKED CHARGES)  Result Date: 07/16/2019 Diagnostic Limited MSK Ultrasound of: Right knee: Quad tendon appears to be intact.  Moderate joint effusion present. Patellar tendon intact. Medial meniscus and medial joint line narrowed with partial extrusion of medial meniscus with hypoechoic change throughout mid substance of meniscus indicating tear. Lateral joint line narrowed partially extruded meniscus with hypoechoic change in meniscus indicating tear. No Baker's cyst present posterior knee. Impression: Knee effusion with degenerative changes in knee including probable degenerative meniscus tears.  Procedure: Real-time Ultrasound Guided Injection of right knee Device: Philips Affiniti 50G Images permanently stored and available for review in the ultrasound unit. Verbal informed consent obtained. Discussed risks and benefits of procedure. Warned about infection bleeding damage to structures skin hypopigmentation and fat atrophy among others. Patient expresses understanding and agreement Time-out conducted.  Noted no overlying erythema, induration, or other signs of local infection.  Skin prepped in a sterile fashion.  Local anesthesia: Topical Ethyl chloride.  With sterile technique and under real time ultrasound guidance: 40 mg of Kenalog and 4 mL of Marcaine injected easily.  Completed without difficulty  Pain moderately resolved suggesting accurate placement of the medication.  Advised to call if fevers/chills, erythema, induration, drainage, or persistent bleeding.  Images permanently stored and available for review in the ultrasound unit. Impression: Technically successful ultrasound guided injection.  [name], personally (  independently) visualized and performed the interpretation of the images attached in this  note.     Assessment and Plan: 64 y.o. male with right knee pain after rotational injury.  Concern for medial meniscus tear in the setting of existing DJD.  Patient has had significant improvement following steroid injection in clinic last week.  Fortunately today exam no obvious laxity felt with physical exam.  Plan to proceed with relative rest and recheck back in about 4 weeks.  If not improving at that point would proceed with MR to evaluate for possible meniscus tear and for potential surgical planning.   Total encounter time 20 minutes including charting time date of service.  Discussed physical exam findings next steps and backup plan.   Discussed warning signs or symptoms. Please see discharge instructions. Patient expresses understanding.   The above documentation has been reviewed and is accurate and complete Lynne Leader

## 2019-08-01 ENCOUNTER — Telehealth: Payer: Self-pay | Admitting: Family Medicine

## 2019-08-01 NOTE — Telephone Encounter (Signed)
Pt still is having trouble with his knee, new spot below kneecap is very tender. He has an appt for April 5 but was wondering if he could come in earlier for another injection, or what you would recommend. I was hesitant to schedule him as I was unsure if the timeline was appropriate for another injection.

## 2019-08-02 NOTE — Telephone Encounter (Signed)
Okay to schedule with me sooner.  Please schedule an appointment for next week.

## 2019-08-02 NOTE — Telephone Encounter (Signed)
Moved appt from 4/5 to 3/22

## 2019-08-05 ENCOUNTER — Other Ambulatory Visit: Payer: Self-pay

## 2019-08-05 ENCOUNTER — Encounter: Payer: Self-pay | Admitting: Family Medicine

## 2019-08-05 ENCOUNTER — Ambulatory Visit: Payer: Self-pay

## 2019-08-05 ENCOUNTER — Ambulatory Visit (INDEPENDENT_AMBULATORY_CARE_PROVIDER_SITE_OTHER): Payer: Medicare Other | Admitting: Family Medicine

## 2019-08-05 VITALS — BP 140/88 | HR 76 | Ht 72.0 in | Wt 339.4 lb

## 2019-08-05 DIAGNOSIS — M25561 Pain in right knee: Secondary | ICD-10-CM | POA: Diagnosis not present

## 2019-08-05 NOTE — Progress Notes (Signed)
I, Christoper Fabian, LAT, ATC, am serving as scribe for Dr. Clementeen Graham.  Ross [name]. is a 64 y.o. male who presents to [facility] at [facility] today for f/u of R knee pain.  He was last seen by Dr.Flornce Record on 07/17/19 and was feeling improved after having a R knee injection on 07/10/19.  He was prescribed Celebrex and advised to use Voltaren gel.  Since his last visit, pt reports worsening of his R knee pain and reports a new area of pain just inferior and medial to the insertion of the patellar tendon onto the tibia.  Pt states that his R knee pain worsens w/ walking and he also reports that his R knee has given way multiple times.  He states that he has had to resume using a walker for ambulation.  He denies any R knee swelling or mechanical symptoms.  He is planning on going on vacation at the beach in mid April.  Diagnostic imaging: R knee XR- 07/10/19  Pertinent review of systems: No fevers or chills  Relevant historical information: Obesity   Exam:  BP 140/88 (BP Location: Left Arm, Patient Position: Sitting, Cuff Size: Large)   Pulse 76   Ht 6' (1.829 m)   Wt (!) 339 lb 6.4 oz (154 kg)   SpO2 96%   BMI 46.03 kg/m  General: Well Developed, well nourished, and in no acute distress.   MSK: Right knee: Normal-appearing without severe effusion. Some swelling at Pes anserine area. Range of motion 0-100 degrees with crepitation. Tender palpation proximal medial anterior tibia at pes anserine bursa area. Intact flexion and extension strength some pain with resisted flexion. Guarding with stability exam testing nondiagnostic.    Lab and Radiology Results  Diagnostic Limited MSK Ultrasound of: Right knee Quad tendon intact mild effusion. Patellar tendon intact normal-appearing Pes anserine bursa area with area of hypoechoic fluid tracking.  Area of maximal tenderness. No bony cortical defects in this region. Impression: Pes anserine bursitis  Procedure:  Real-time Ultrasound Guided Injection of right knee pes anserine bursa Device: Philips Affiniti 50G Images permanently stored and available for review in the ultrasound unit. Verbal informed consent obtained.  Discussed risks and benefits of procedure. Warned about infection bleeding damage to structures skin hypopigmentation and fat atrophy among others. Patient expresses understanding and agreement Time-out conducted.   Noted no overlying erythema, induration, or other signs of local infection.   Skin prepped in a sterile fashion.   Local anesthesia: Topical Ethyl chloride.   With sterile technique and under real time ultrasound guidance:  40 mg of Kenalog and 2 mL of Marcaine injected easily.   Completed without difficulty   Pain immediately resolved suggesting accurate placement of the medication.   Advised to call if fevers/chills, erythema, induration, drainage, or persistent bleeding.   Images permanently stored and available for review in the ultrasound unit.  Impression: Technically successful ultrasound guided injection.        Assessment and Plan: 64 y.o. male with right knee pain after injury.  Patient thought to have exacerbation of DJD and/or meniscus injury.  He had steroid injection previously which worked quite well until recently.  He is now having new anterior medial proximal knee pain.  Physical exam and ultrasound examination support Pez anserine bursitis.  Patient had moderate benefit in pain immediately following injection.  Plan to continue home exercise program and watchful waiting.  We will recheck back mid April prior to his vacation to make  a determination.  If not better would proceed with MRI likely.  Additionally we discussed weight management.  Patient has BMI of 45.  Recommend weight loss.  He is already think about doing [employer]ystem which I think is a good idea.   PDMP not reviewed this encounter. Orders Placed This Encounter  Procedures  . [place] LIMITED  JOINT SPACE STRUCTURES LOW RIGHT(NO LINKED CHARGES)    Order Specific Question:   Reason for Exam (SYMPTOM  OR DIAGNOSIS REQUIRED)    Answer:   R knee pain    Order Specific Question:   Preferred imaging location?    Answer:   [facility]   No orders of the defined types were placed in this encounter.    Discussed warning signs or symptoms. Please see discharge instructions. Patient expresses understanding.   The above documentation has been reviewed and is accurate and complete Lynne Leader

## 2019-08-05 NOTE — Patient Instructions (Signed)
Thank you for coming in today. Please recheck with me prior to vacation.  I think this is Pes Bursitis.    Pes Anserine Bursitis  The pes anserine is an area on the inside of your knee, just below the joint, that is cushioned by a fluid-filled sac (bursa). Pes anserine bursitis is a condition that happens when the bursa gets swollen and irritated. The condition causes knee pain. What are the causes? This condition may be caused by:  Making the same movement over and over.  A direct hit (trauma) to the inside of the leg. What increases the risk? You are more likely to develop this condition if you:  Are a runner.  Play sports that involve a lot of running and quick side-to-side movements (cutting).  Are an athlete who plays contact sports.  Swim using an inward angle of the knee, such as with the breaststroke.  Have tight hamstring muscles.  Are a woman.  Are overweight.  Have flat feet.  Have diabetes or osteoarthritis. What are the signs or symptoms? Symptoms of this condition include:  Knee pain that gets better with rest and worse with activities like climbing stairs, walking, running, or getting in and out of a chair.  Swelling.  Warmth.  Tenderness when pressing at the inside of the lower leg, just below the knee. How is this diagnosed? This condition may be diagnosed based on:  Your symptoms.  Your medical history.  A physical exam. ? During your physical exam, your health care provider will press on the tendon attachment to see if you feel pain. ? Your health care provider may also check your hip and knee motion and strength.  Tests to check for swelling and fluid buildup in the bursa and to look at muscles, bones, and tendons. These tests might include: ? X-rays. ? MRI. ? Ultrasound. How is this treated? This condition may be treated by:  Resting your knee. You may be told to raise (elevate) your knee while resting.  Avoiding activities that  cause pain.  Icing the inside of your knee.  Sleeping with a pillow between your knees. This will cushion your injured knee.  Taking medicine by mouth (orally) to reduce pain and swelling or having medicine injected into your knee.  Doing strengthening and stretching exercises (physical therapy). If these treatments do not work or if the condition keeps coming back, you may need to have surgery to remove the bursa. Follow these instructions at home: Managing pain, stiffness, and swelling   If directed, put ice on the injured area. ? Put ice in a plastic bag. ? Place a towel between your skin and the bag. ? Leave the ice on for 20 minutes, 2-3 times a day.  Elevate the injured area above the level of your heart while you are sitting or lying down. Activity  Return to your normal activities as told by your health care provider. Ask your health care provider what activities are safe for you.  Do exercises as told by your health care provider. General instructions  Take over-the-counter and prescription medicines only as told by your health care provider.  Sleep with a pillow between your knees.  Do not use any products that contain nicotine or tobacco, such as cigarettes, e-cigarettes, and chewing tobacco. These can delay healing. If you need help quitting, ask your health care provider.  If you are overweight, work with your health care provider and a dietitian to set a weight-loss goal that is healthy  and reasonable for you.  Keep all follow-up visits as told by your health care provider. This is important. How is this prevented?  When exercising, make sure that you: ? Warm up and stretch before being active. ? Cool down and stretch after being active. ? Give your body time to rest between periods of activity. ? Use equipment that fits you. ? Are safe and responsible while being active to avoid falls. ? Do at least 150 minutes of moderate-intensity exercise each week, such  as brisk walking or water aerobics. ? Maintain physical fitness, including:  Strength.  Flexibility.  Cardiovascular fitness.  Endurance. ? Maintain a healthy weight. Contact a health care provider if:  Your symptoms do not improve.  Your symptoms get worse. Summary  Pes anserine bursitis is a condition that happens when the fluid-filled sac (bursa) at the inside of your knee gets swollen and irritated. The condition causes knee pain.  Treatment for pes anserine bursitis may include resting your knee, icing the inside of your knee, sleeping with a pillow between your knees, taking medicine by mouth or by injection, and doing strengthening and stretching exercises (physical therapy).  Follow instructions for managing pain, stiffness, and swelling.  Take over-the-counter and prescription medicines only as told by your health care provider. This information is not intended to replace advice given to you by your health care provider. Make sure you discuss any questions you have with your health care provider. Document Revised: 08/23/2018 Document Reviewed: 10/11/2017 Elsevier Patient Education  [place].    Pes Anserine Bursitis Rehab Ask your health care provider which exercises are safe for you. Do exercises exactly as told by your health care provider and adjust them as directed. It is normal to feel mild stretching, pulling, tightness, or discomfort as you do these exercises. Stop right away if you feel sudden pain or your pain gets worse. Do not begin these exercises until told by your health care provider. Stretching and range-of-motion exercises These exercises warm up your muscles and joints and improve the movement and flexibility of your knee. These exercises also help to relieve pain and stiffness. Hamstring stretch, doorway  1. Lie on your back in front of a doorway with your left / right leg resting against the wall and your other leg flat on the floor in the  doorway. There should be a slight bend in your left / right knee. 2. Straighten your left / right knee. You should feel a stretch behind your knee or thigh (hamstring). If you do not, scoot your buttocks closer to the door. 3. Hold this position for __________ seconds. Repeat __________ times. Complete this exercise __________ times a day. Seated stretch This exercise is sometimes called hamstrings and adductors stretch. 1. Sit on the floor with your legs stretched wide. Keep your knees straight during this exercise. 2. Keeping your head and back in a straight line, bend at your waist to reach for your left foot (position A). You should feel a stretch in your right inner thigh (adductors). 3. Hold for __________ seconds. Then slowly return to the upright position. 4. Keeping your head and back in a straight line, bend at your waist to reach forward (position B). You should feel a stretch behind both of your thighs or knees (hamstrings). 5. Hold for __________ seconds. Then slowly return to the upright position. 6. Keeping your head and back in a straight line, bend at your waist to reach for your right foot (position C).  You should feel a stretch in your left inner thigh (adductors). 7. Hold for __________ seconds. Then slowly return to the upright position. Repeat __________ times. Complete this exercise __________ times a day. Quadriceps, prone  1. Lie on your abdomen on a firm surface, such as a bed or padded floor (prone position). 2. Bend your left / right knee and hold your ankle. If you cannot reach your ankle or pant leg, loop a belt around your foot and grab the belt instead. 3. Gently pull your heel toward your buttocks. Your knee should not slide out to the side. You should feel a stretch in the front of your thigh and knee (quadriceps). 4. Hold this position for __________ seconds. Repeat __________ times. Complete this exercise __________ times a day. Strengthening exercises These  exercises build strength and endurance in your knee and hip. Endurance is the ability to use your muscles for a long time, even after they get tired. Quadriceps terminal knee extension 1. Secure a long loop of rubber exercise band around a sturdy object like a table leg. 2. Put the band behind your left / right knee. Step back from where the band is secured to put tension on the band. 3. Slowly bend your left / right knee. Keep your left / right foot flat on the floor. 4. Tighten the muscle in the front of your thigh (quadriceps) and push back against the band to straighten your knee until it is completely straight (terminal extension). 5. Hold this position for __________ seconds. 6. Return to having your left / right knee bent. Repeat __________ times. Complete this exercise __________ times a day. Straight leg raises, side-lying This exercise strengthens the muscles that rotate the leg at the hip and move it away from your body (hip abductors). 1. Lie on your side with your left / right leg in the top position. Lie so your head, shoulder, hip, and knee line up. You may bend your bottom knee to help you balance. 2. Lift your top leg 4-6 inches (10-15 cm) while keeping your toes pointed straight ahead. 3. Hold this position for __________ seconds. 4. Slowly lower your leg to the starting position. 5. Allow your muscles to relax completely after each repetition. Repeat __________ times. Complete this exercise __________ times a day. This information is not intended to replace advice given to you by your health care provider. Make sure you discuss any questions you have with your health care provider. Document Revised: 08/23/2018 Document Reviewed: 02/22/2018 Elsevier Patient Education  2020 [employer].

## 2019-08-19 ENCOUNTER — Ambulatory Visit: Payer: Medicare Other | Admitting: Family Medicine

## 2019-08-23 ENCOUNTER — Ambulatory Visit (INDEPENDENT_AMBULATORY_CARE_PROVIDER_SITE_OTHER): Payer: Medicare Other | Admitting: Family Medicine

## 2019-08-23 ENCOUNTER — Other Ambulatory Visit: Payer: Self-pay

## 2019-08-23 VITALS — BP 118/82 | HR 83 | Ht 72.0 in | Wt 326.0 lb

## 2019-08-23 DIAGNOSIS — Z6841 Body Mass Index (BMI) 40.0 and over, adult: Secondary | ICD-10-CM

## 2019-08-23 DIAGNOSIS — M25561 Pain in right knee: Secondary | ICD-10-CM

## 2019-08-23 NOTE — Progress Notes (Addendum)
   Bruce Donath, am serving as a scribe for Dr. [name] This visit occurred during the SARS-CoV-2 public health emergency.  Safety protocols were in place, including screening questions prior to the visit, additional usage of staff PPE, and extensive cleaning of exam room while observing appropriate contact time as indicated for disinfecting solutions.   Ross [name]. is a 64 y.o. male who presents to [facility] at [facility] today for f/u of R knee pain.  He was last seen by Dr. [name] on 08/05/19 w/ worsening pain, particularly w/ walking and multiple episodes of giving way.  He had a R knee pes anserine injection at his last visit.  Since his last visit, pt reports that his pain dissipated 4-5 days after the last injection. Patient has started [employer] and is losing weight.  He is going on vacation to the beach next week and very happy that his knee pain is much improved.  Diagnostic imaging: R knee XR- 07/10/19  Pertinent review of systems: No fevers or chills  Relevant historical information: Hypertension and obesity.   Exam:  BP 118/82   Pulse 83   Ht 6' (1.829 m)   Wt (!) 326 lb (147.9 kg)   SpO2 98%   BMI 44.21 kg/m  General: Well Developed, well nourished, and in no acute distress.   MSK: Right knee normal-appearing nontender normal motion       Assessment and Plan: 64 y.o. male with right knee pain significant improvement following injection at last visit.  Additionally patient is embarking on a weight loss journey which should hopefully also reduce his knee pain.  Spent some time today's discussing weight management strategies.  Recheck back with me as needed.  Congratulations on patient on losing weight.  This will also up his blood pressure and other medical issues.   PDMP not reviewed this encounter. No orders of the defined types were placed in this encounter.  No orders of the defined types were placed in this  encounter.    Discussed warning signs or symptoms. Please see discharge instructions. Patient expresses understanding.   The above documentation has been reviewed and is accurate and complete Clementeen Graham   Total encounter time 20 minutes including charting time date of service. Backup plan for knee pain and weight loss strategy discussion

## 2019-08-23 NOTE — Patient Instructions (Signed)
Thank you for coming in today. Recheck as needed.  Keep working on weight loss.  Enjoy the beach.

## 2019-09-01 IMAGING — DX DG LUMBAR SPINE COMPLETE 4+V
5 series · 5 of 5 positions shown · non-contrast
Comparison: CT 11/14/2014

CLINICAL DATA: Back pain

EXAM:
LUMBAR SPINE - COMPLETE 4+ VIEW

[lumbar spine ap]
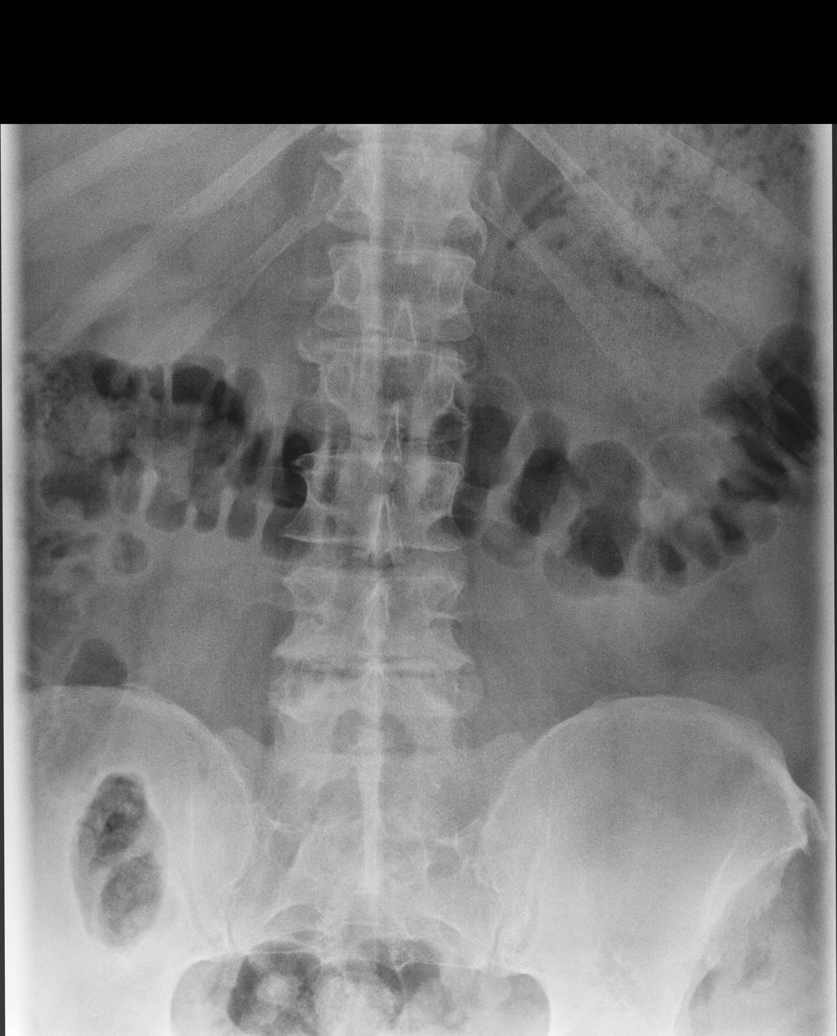

[sacrum lat]
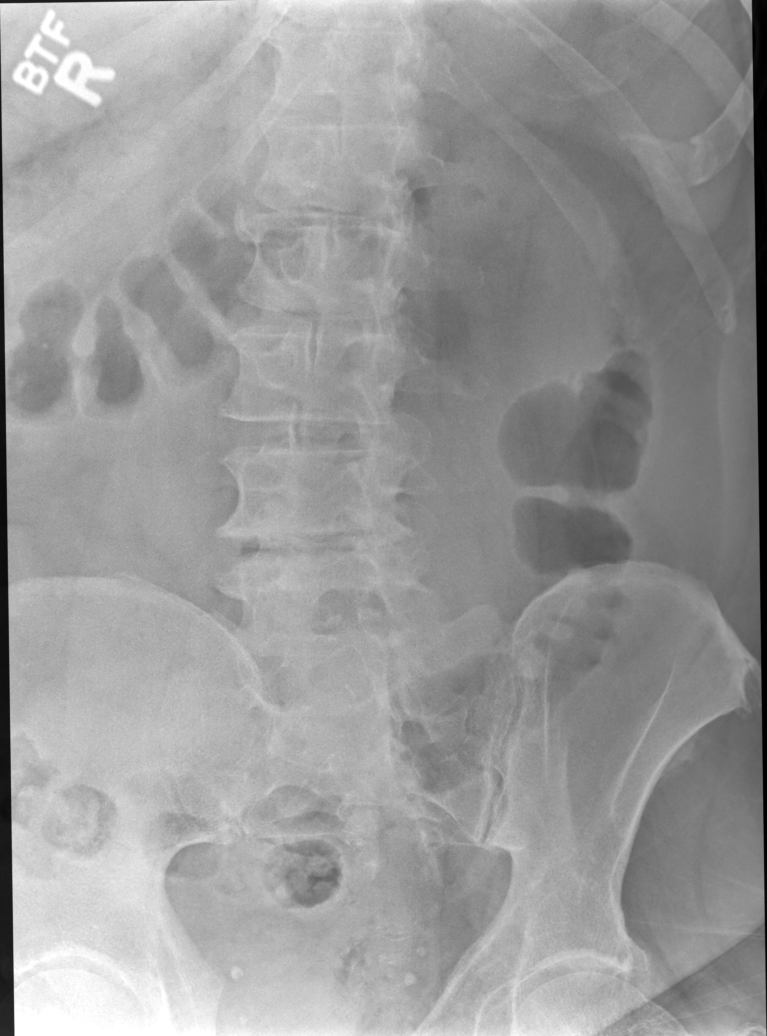

[lumbar spine lat (1 of 3)]
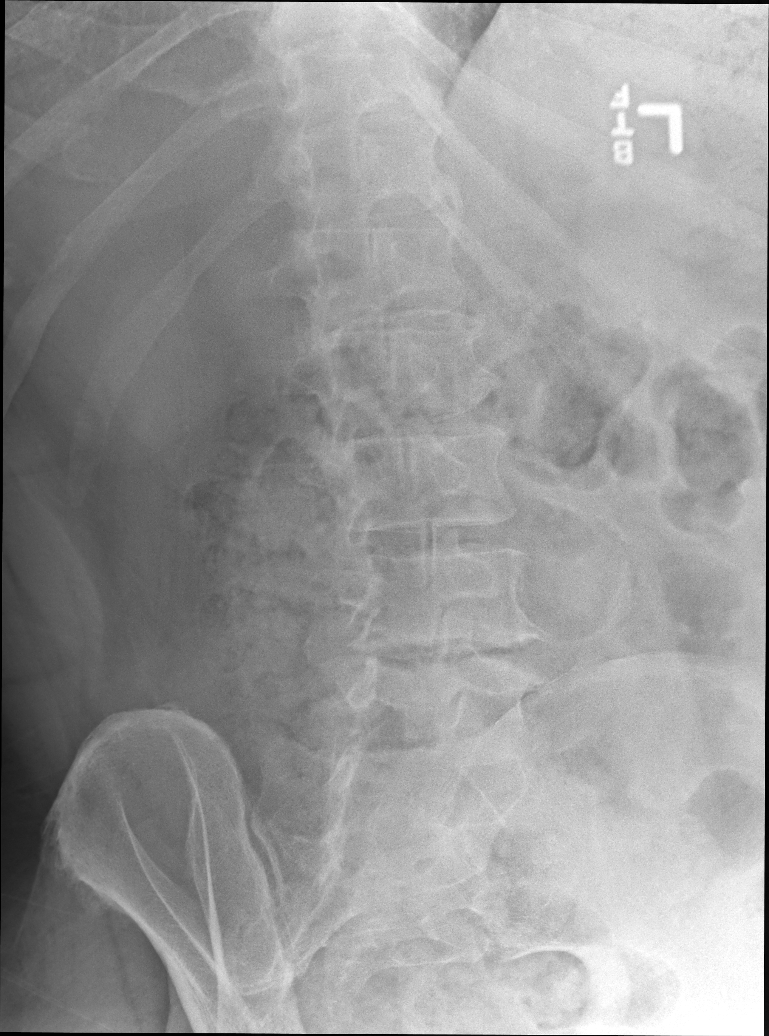

[lumbar spine lat (2 of 3)]
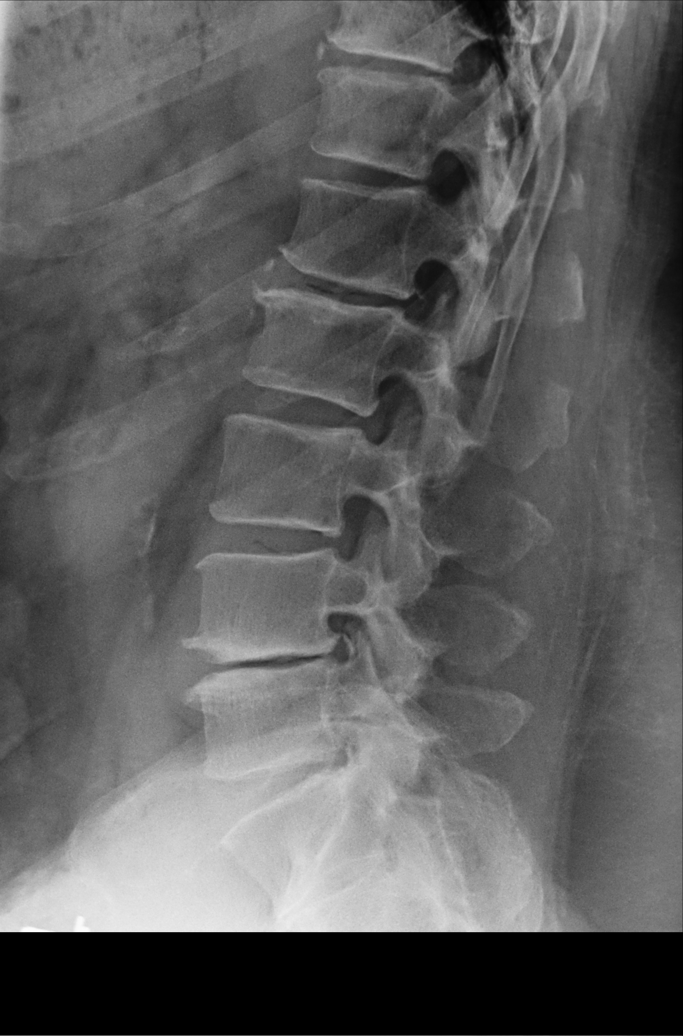

[lumbar spine lat (3 of 3)]
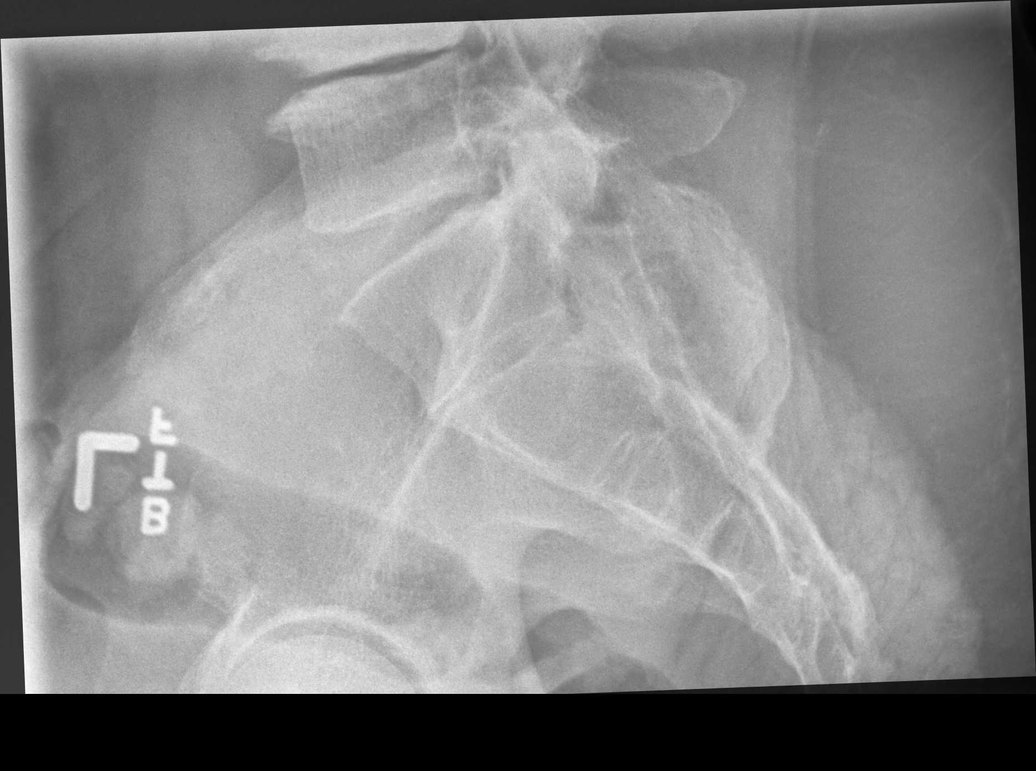

[5 of 5 positions shown; findings below may reference images not displayed]

FINDINGS: Lumbar alignment within normal limits. Vertebral body heights are
normal. Moderate degenerative change at L1-L2 and L4-L5. Mild vacuum
disc at L3-L4. Mild degenerative change at L5-S1.
IMPRESSION: Multiple level degenerative change, most notable at L4-L5. No acute
osseous abnormality.

## 2019-09-16 ENCOUNTER — Telehealth: Payer: Self-pay | Admitting: Pulmonary Disease

## 2019-09-16 DIAGNOSIS — G4733 Obstructive sleep apnea (adult) (pediatric): Secondary | ICD-10-CM

## 2019-09-16 NOTE — Telephone Encounter (Signed)
Called spoke with patient who reported a recent insurance change and difficulty with [employer] since.  Patient would like to switch to [facility] for CPAP.  Last office 11.10.2020 with Dr Vassie Loll and documented excellent compliance.  Order placed. Patient aware that if insurance requires another face-to-face visit he will receive a call to schedule this (patient believes the Nov 2020 appt will be sufficient).  Nothing further needed at this time; will sign off.  Routing to Dr Vassie Loll to make him aware.

## 2019-09-17 ENCOUNTER — Telehealth: Payer: Self-pay | Admitting: Pulmonary Disease

## 2019-09-17 NOTE — Telephone Encounter (Signed)
Left message for patient to call back.   Will go ahead and send a copy of his sleep study to [place] for him.

## 2019-09-18 ENCOUNTER — Telehealth: Payer: Self-pay | Admitting: Pulmonary Disease

## 2019-09-18 NOTE — Telephone Encounter (Signed)
Copy of pt's sleep study was sent to [facility] at pt's request. Attempted to call pt to let him know this had been taken care of but unable to reach. Left pt a detailed message per his request stating that this was taken care of for him. Nothing further needed.

## 2019-09-18 NOTE — Telephone Encounter (Signed)
Called [address] and spoke with [place]. Burna Mortimer is needing to have pt's 06/04/15 HST faxed to them. HST has been faxed to provided fax number. Nothing further needed.

## 2019-09-18 NOTE — Telephone Encounter (Signed)
Pt returning call.  [phone].  Please send copy of sleep study to  [phone].   AttnBurna Mortimer.

## 2019-09-18 NOTE — Telephone Encounter (Signed)
Would be available for the next 1 1/2-2 hours.  [phone] cell, but may not work in the area.  Can leave detailed message on voice mail at home number.

## 2019-10-09 NOTE — Progress Notes (Deleted)
Phone: [phone]   Subjective:  Patient presents today for their Welcome to Medicare Exam    Preventive Screening-Counseling & Management  Vision screen: *** No exam data present  Advanced directives: ***  Modifiable Risk Factors/behavioral risk assessment/psychosocial risk assessment Regular exercise: *** Diet: ***  Wt Readings from Last 3 Encounters:  08/23/19 (!) 326 lb (147.9 kg)  08/05/19 (!) 339 lb 6.4 oz (154 kg)  07/17/19 (!) 338 lb (153.3 kg)   Smoking Status: ***Never Smoker Second Hand Smoking status: ***No smokers in home Alcohol intake: *** per week  Cardiac risk factors:  advanced age (older than 78 for men, 30 for women) *** *** Hyperlipidemia *** *** Hypertension *** No diabetes. *** Lab Results  Component Value Date   HGBA1C 6.0 11/10/2017   Family History: ***  Family History  Problem Relation Age of Onset  . Colon cancer Mother   . Breast cancer Mother   . Prostate cancer Father   . Testicular cancer Father   . Stomach cancer Paternal Grandmother   . Diabetes Maternal Grandmother   . Heart disease Maternal Grandmother   . Heart disease Maternal Grandfather   . Other Son        "stomach aneurysm"    Depression Screen/risk evaluation Risk factors: ***.. PHQ2 0 *** Depression screen [facility] 2/9 06/20/2019 04/19/2019 07/06/2018 03/28/2018 01/26/2017  Decreased Interest 0 0 0 0 0  Down, Depressed, Hopeless 0 1 0 0 1  PHQ - 2 Score 0 1 0 0 1    Functional ability and level of safety Mobility assessment: *** timed get up and go <12 seconds Activities of Daily Living- Independent in ADLs (toileting, bathing, dressing, transferring, eating) and in IADLs (shopping, housekeeping, managing own medications, and handling finances)*** Home Safety: Loose rugs (***), smoke detectors (***), small pets (***), grab bars (***), stairs (***), life-alert system (***) Hearing Difficulties: ***-patient declines Fall Risk: ***None  Fall Risk  01/26/2017 10/08/2015  10/30/2014  Falls in the past year? No No No   Opioid use history: *** no long term opioids use Self assessment of health status: ***  Required Immunizations needed today:  *** Immunization History  Administered Date(s) Administered  . Fluad Quad(high Dose 65+) 03/25/2019  . Influenza Split 02/13/2013  . Influenza,inj,Quad PF,6+ Mos 02/08/2016, 01/26/2017  . Influenza-Unspecified 02/13/2014, 03/14/2018  . Moderna SARS-COVID-2 Vaccination 05/20/2019  . Pneumococcal Conjugate-13 03/25/2019  . Td 01/25/2007  . Tdap 05/26/2017  . Zoster 03/21/2016   Health Maintenance  Topic Date Due  . COVID-19 Vaccine (2 - Moderna 2-dose series) 06/17/2019  . Flu Shot  12/15/2019  . Pneumonia vaccines (2 of 2 - PPSV23) 03/24/2020  . Colon Cancer Screening  05/03/2025  . Tetanus Vaccine  05/27/2027  .  Hepatitis C: One time screening is recommended by Center for Disease Control  (CDC) for  adults born from [birth year] through 1967.   Completed  . HIV Screening  Completed    Screening tests-  Health Maintenance Due  Topic Date Due  . COVID-19 Vaccine (2 - Moderna 2-dose series) 06/17/2019   1. Colon cancer screening- *** 2. Lung Cancer screening- *** 3. Skin cancer screening- *** 4. Prostate cancer screening Lab Results  Component Value Date   PSA 1.0 07/06/2018   PSA 1.17 11/10/2017   PSA 1.42 10/27/2016    4. Cervical cancer screening- *** 5. Breast cancer screening- ***  The following were reviewed and entered/updated in epic: Past Medical History:  Diagnosis Date  . Anal fissure   .  Arthritis   . DDD (degenerative disc disease)   . Family history of malignant neoplasm of gastrointestinal tract    mother  . Fatty liver   . GERD (gastroesophageal reflux disease)   . Glaucoma   . Gout   . Hemorrhoids   . Hypertension   . Rectal bleeding   . Sleep apnea   . Stroke [facility])    in right eye   Patient Active Problem List   Diagnosis Date Noted  . Venous stasis dermatitis 05/21/2018   . Plantar fasciitis 05/11/2018  . Trigger middle finger of left hand 03/31/2018  . Cervical arthritis 05/26/2017  . Elevated blood sugar 08/10/2015  . Diarrhea 01/27/2015  . BPH associated with nocturia 01/12/2015  . Back pain with right-sided sciatica 09/02/2013  . GERD (gastroesophageal reflux disease) 02/18/2011  . Essential hypertension 11/27/2009  . Obstructive sleep apnea 10/29/2009  . Morbid obesity ([place]) 04/02/2009  . Gout 01/25/2007  . ERECTILE DYSFUNCTION, ORGANIC 01/25/2007   Past Surgical History:  Procedure Laterality Date  . EYE SURGERY Right    laser   . GLAUCOMA SURGERY Bilateral   . LUMBAR DISC SURGERY     L5  . PYLOROPLASTY     pyloric stenosis, as infant  . TONSILLECTOMY      Family History  Problem Relation Age of Onset  . Colon cancer Mother   . Breast cancer Mother   . Prostate cancer Father   . Testicular cancer Father   . Stomach cancer Paternal Grandmother   . Diabetes Maternal Grandmother   . Heart disease Maternal Grandmother   . Heart disease Maternal Grandfather   . Other Son        "stomach aneurysm"    Medications- reviewed and updated Current Outpatient Medications  Medication Sig Dispense Refill  . allopurinol (ZYLOPRIM) 300 MG tablet Take 1 tablet (300 mg total) by mouth daily. 90 tablet 3  . aspirin 81 MG tablet Take 81 mg by mouth daily.      . celecoxib (CELEBREX) 200 MG capsule One to 2 tablets by mouth daily as needed for pain. 60 capsule 2  . colchicine 0.6 MG tablet Take 2 tablets at first sign of gout flare then repeat in 2 hours 1 tablet, take 1 tablet a day until resolved (or 10 days maximum). 90 tablet 3  . Diclofenac Sodium (PENNSAID) 2 % SOLN Place 1 application onto the skin 2 (two) times daily. 112 g 2  . ipratropium (ATROVENT) 0.06 % nasal spray PLACE 2 SPRAYS INTO BOTH NOSTRILS 4 (FOUR) TIMES DAILY. 15 mL 0  . losartan-hydrochlorothiazide (HYZAAR) 100-12.5 MG tablet Take 1 tablet by mouth daily. 90 tablet 3  .  Multiple Vitamin (MULTIVITAMIN) tablet Take 1 tablet by mouth. Once or twice a week    . omeprazole (PRILOSEC) 40 MG capsule TAKE 1 CAPSULE BY MOUTH EVERY MORNING. 100 capsule 3  . sildenafil (REVATIO) 20 MG tablet Take 2-5 tablets as needed once every 48 hours for erectile dysfunction 50 tablet 3   No current facility-administered medications for this visit.    Allergies-reviewed and updated No Known Allergies  Social History   Socioeconomic History  . Marital status: Married    Spouse name: Alma Friendly  . Number of children: 3  . Years of education: Not on file  . Highest education level: Not on file  Occupational History  . 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  Tobacco Use  . Smoking status: Former Smoker  Packs/day: 1.00    Years: 20.00    Pack years: 20.00    Types: Cigarettes    Quit date: 05/17/1991    Years since quitting: 28.4  . Smokeless tobacco: Never Used  Substance and Sexual Activity  . Alcohol use: Yes    Alcohol/week: 2.0 standard drinks    Types: 2 Standard drinks or equivalent per week    Comment: Social use   . Drug use: No  . Sexual activity: Yes  Other Topics Concern  . Not on file  Social History Narrative   Married- lost wife Isabelle Course Dec 2017n liver transplant HCV- he was tested and negative. 2 children prior marriage.       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       Hobbies: out to eat, shopping   Volleyball, cycling, basketball in past.    Family history researc   Social Determinants of Health   Financial Resource Strain:   . Difficulty of Paying Living Expenses:   Food Insecurity:   . Worried About Programme researcher, broadcasting/film/video in the Last Year:   . Barista in the Last Year:   Transportation Needs:   . Freight forwarder (Medical):   Marland Kitchen Lack of Transportation (Non-Medical):   Physical Activity:   . Days of Exercise per Week:   . Minutes of Exercise per Session:   Stress:   . Feeling of Stress :   Social  Connections:   . Frequency of Communication with Friends and Family:   . Frequency of Social Gatherings with Friends and Family:   . Attends Religious Services:   . Active Member of Clubs or Organizations:   . Attends Banker Meetings:   Marland Kitchen Marital Status:    Objective  Objective:  There were no vitals taken for this visit. Gen: NAD, resting comfortably HEENT: Mucous membranes are moist. Oropharynx normal Neck: no thyromegaly CV: RRR no murmurs rubs or gallops Lungs: CTAB no crackles, wheeze, rhonchi Abdomen: soft/nontender/nondistended/normal bowel sounds. No rebound or guarding.  Ext: no edema Skin: warm, dry Neuro: grossly normal, moves all extremities, PERRLA   Assessment and Plan:   Welcome to Medicare exam completed-  1. Educated, counseled and referred based on above elements 2. Educated, counseled and referred as appropriate for preventative needs 3. Discussed and documented a written plan for preventiative services and screenings with personalized health advice- After Visit Summary was given to patient which included this plan *** 4. EKG offered [id number]- patient ***  Status of chronic or acute concerns  #Gout S: *** flares in *** on Colchicine 0.6Mg , Allopurinol 300Mg  Lab Results  Component Value Date   LABURIC 6.6 10/27/2016   A/P:***   # GERD S:***  B12 levels related to PPI use: Prilosec 40Mg  Lab Results  Component Value Date   VITAMINB12 419 11/10/2017   A/P: ***   #hypertension S: medication: Losartan-HCTZ 100-12.5MG  Home readings #s: *** BP Readings from Last 3 Encounters:  08/23/19 118/82  08/05/19 140/88  07/17/19 134/80  A/P: ***     ***uric acid level, a1c.  Consider B12 with PPI  No problem-specific Assessment & Plan notes found for this encounter.   Recommended follow up: *** Future Appointments  Date Time Provider Department Center  10/18/2019  1:20 PM 09/16/19, MD [facility]     Lab/Order  associations: No diagnosis found.  No orders of the defined types were placed in this encounter.   Return precautions advised. 12/18/2019,  CMA

## 2019-10-18 ENCOUNTER — Encounter: Payer: Medicare Other | Admitting: Family Medicine

## 2019-10-22 ENCOUNTER — Other Ambulatory Visit: Payer: Self-pay | Admitting: Family Medicine

## 2020-01-30 ENCOUNTER — Other Ambulatory Visit: Payer: Self-pay

## 2020-01-30 ENCOUNTER — Ambulatory Visit (INDEPENDENT_AMBULATORY_CARE_PROVIDER_SITE_OTHER): Payer: Medicare Other | Admitting: Family Medicine

## 2020-01-30 ENCOUNTER — Encounter: Payer: Self-pay | Admitting: Family Medicine

## 2020-01-30 VITALS — BP 128/76 | HR 76 | Temp 97.6°F | Ht 72.0 in | Wt 332.0 lb

## 2020-01-30 DIAGNOSIS — Z0000 Encounter for general adult medical examination without abnormal findings: Secondary | ICD-10-CM | POA: Diagnosis not present

## 2020-01-30 DIAGNOSIS — M1A00X0 Idiopathic chronic gout, unspecified site, without tophus (tophi): Secondary | ICD-10-CM

## 2020-01-30 DIAGNOSIS — Z136 Encounter for screening for cardiovascular disorders: Secondary | ICD-10-CM | POA: Diagnosis not present

## 2020-01-30 DIAGNOSIS — Z23 Encounter for immunization: Secondary | ICD-10-CM

## 2020-01-30 DIAGNOSIS — Z79899 Other long term (current) drug therapy: Secondary | ICD-10-CM

## 2020-01-30 DIAGNOSIS — Z125 Encounter for screening for malignant neoplasm of prostate: Secondary | ICD-10-CM

## 2020-01-30 DIAGNOSIS — R351 Nocturia: Secondary | ICD-10-CM

## 2020-01-30 DIAGNOSIS — I10 Essential (primary) hypertension: Secondary | ICD-10-CM

## 2020-01-30 DIAGNOSIS — R739 Hyperglycemia, unspecified: Secondary | ICD-10-CM

## 2020-01-30 DIAGNOSIS — Z87891 Personal history of nicotine dependence: Secondary | ICD-10-CM | POA: Diagnosis not present

## 2020-01-30 LAB — POCT GLYCOSYLATED HEMOGLOBIN (HGB A1C): Hemoglobin A1C: 5.8 % — AB (ref 4.0–5.6)

## 2020-01-30 MED ORDER — OMEPRAZOLE 40 MG PO CPDR
DELAYED_RELEASE_CAPSULE | ORAL | 3 refills | Status: DC
Start: 2020-01-30 — End: 2020-08-04

## 2020-01-30 MED ORDER — COLCHICINE 0.6 MG PO TABS: ORAL_TABLET | ORAL | 3 refills | Status: AC

## 2020-01-30 MED ORDER — LOSARTAN POTASSIUM-HCTZ 100-12.5 MG PO TABS: 1.0000 | ORAL_TABLET | Freq: Every day | ORAL | 3 refills | Status: DC

## 2020-01-30 MED ORDER — SILDENAFIL CITRATE 20 MG PO TABS: ORAL_TABLET | ORAL | 3 refills | Status: AC

## 2020-01-30 MED ORDER — ALLOPURINOL 300 MG PO TABS: 300.0000 mg | ORAL_TABLET | Freq: Every day | ORAL | 3 refills | Status: AC

## 2020-01-30 NOTE — Addendum Note (Signed)
Addended by: Manuela Schwartz on: 01/30/2020 04:44 PM   Modules accepted: Orders

## 2020-01-30 NOTE — Patient Instructions (Addendum)
Health Maintenance Due  Topic Date Due   COVID-19 Vaccine (2 - Moderna 2-dose series)- please call us with date of 2nd vaccine 06/17/2019   INFLUENZA VACCINE - high dose today 12/15/2019   You had zostavax but also could get shingrix. Please check with your pharmacy to see if they have the shingrix vaccine. If they do- please get this immunization and update [place] by phone call or mychart with dates you receive the vaccine  We will call you within two weeks about your referral for AAA screening which will be an ultrasound. If you do not hear within 3 weeks, give [place] a call.   Please stop by lab before you go If you have mychart- we will send your results within 3 business days of [place] receiving them.  If you do not have mychart- we will call you about results within 5 business days of [place] receiving them.  *please note we are currently using [facility] which has a longer processing time than [place] typically so labs may not come back as quickly as in the past *please also note that you will see labs on mychart as soon as they post. I will later go in and write notes on them- will say "notes from Dr. Durene Cal"     Mr. Fesler , Thank you for taking time to come for your Medicare Wellness Visit. I appreciate your ongoing commitment to your health goals. Please review the following plan we discussed and let me know if I can assist you in the future.   These are the goals we discussed: 1. Restart nutrasystem but also get a plan in place once you come of nutrasystem to maintain weight loss 2. Stay on lookout for formal recommendations about booster for covid 19   This is a list of the screening recommended for you and due dates:  Health Maintenance  Topic Date Due   COVID-19 Vaccine (2 - Moderna 2-dose series) 06/17/2019   Flu Shot  12/15/2019   Pneumonia vaccines (2 of 2 - PPSV23) 03/24/2020   Colon Cancer Screening  05/03/2025   Tetanus Vaccine  05/27/2027    Hepatitis C: One time screening  is recommended by Center for Disease Control  (CDC) for  adults born from [birth year] through 1967.   Completed   HIV Screening  Completed    Recommended follow up: Return in about 6 months (around 07/29/2020) for follow up- or sooner if needed.

## 2020-01-30 NOTE — Addendum Note (Signed)
Addended by: Manuela Schwartz on: 01/30/2020 04:03 PM   Modules accepted: Orders

## 2020-01-30 NOTE — Progress Notes (Signed)
Phone: [phone]   Subjective:  Patient presents today for their Welcome to Medicare Exam    Preventive Screening-Counseling & Management  Vision screen:   Hearing Screening   125Hz  250Hz  500Hz  1000Hz  2000Hz  3000Hz  4000Hz  6000Hz  8000Hz   Right ear:           Left ear:           Comments: Left ear- 101 Right er- 102   Visual Acuity Screening   Right eye Left eye Both eyes  Without correction:     With correction: 20/30 20/25    Advanced directives: Full code. Is going to work with his brother on will and also on HCPOA/living will- brother is a Clinical research associatelawyer  Modifiable Risk Factors/behavioral risk assessment/psychosocial risk assessment Regular exercise: working with Dr. Denyse Amassorey  On knee/hip issues- has had 2 shots in the knee and apparently they are even considering replacement. Has made some home adjustments- safe step tub and new shower with nonslip floors and grab bars. Has stair lift installed Diet: nutrasystem earlier this year and lost 20 something pounds  And then regained Wt Readings from Last 3 Encounters:  01/30/20 (!) 332 lb (150.6 kg)  08/23/19 (!) 326 lb (147.9 kg)  08/05/19 (!) 339 lb 6.4 oz (154 kg)   Smoking Status: former Smoker quit in 1990s. Get AAA scan Second Hand Smoking status: No smokers in home Alcohol intake: 0-5 per week  Cardiac risk factors:  advanced age (older than 4155 for men, 6665 for women) g mild Hyperlipidemia - based off LDL above 70. hed be interested in coronary calcium scoring potentially- will get lab stoday. Last 10 year ascvd risk of around 14% Lab Results  Component Value Date   CHOL 158 11/10/2017   HDL 42.10 11/10/2017   LDLCALC 79 11/10/2017   TRIG 181.0 (H) 11/10/2017   CHOLHDL 4 11/10/2017  treated Hypertension  Lab Results  Component Value Date   CHOL 158 11/10/2017   HDL 42.10 11/10/2017   LDLCALC 79 11/10/2017   TRIG 181.0 (H) 11/10/2017   CHOLHDL 4 11/10/2017  No diabetes. Monitoring prediabetes Lab Results  Component  Value Date   HGBA1C 6.0 11/10/2017   Family History: grandparents only  Family History  Problem Relation Age of Onset  . Colon cancer Mother   . Breast cancer Mother   . Prostate cancer Father   . Testicular cancer Father   . Stomach cancer Paternal Grandmother   . Diabetes Maternal Grandmother   . Heart disease Maternal Grandmother   . Heart disease Maternal Grandfather   . Other Son        "stomach aneurysm"    Depression Screen/risk evaluation Risk factors: stress from covid.Marland Kitchen. PHQ2 0  [facility] 01/30/2020 06/20/2019 04/19/2019 07/06/2018 03/28/2018  Decreased Interest 0 0 0 0 0  Down, Depressed, Hopeless 0 0 1 0 0  PHQ - 2 Score 0 0 1 0 0    Functional ability and level of safety Mobility assessment:  timed get up and go <12 seconds Activities of Daily Living- Independent in ADLs (toileting, bathing, dressing, transferring, eating) and in IADLs (shopping, housekeeping- hires someone because he doesn't like it but he can, managing own medications, and handling finances) Home Safety: Loose rugs (a few- cautioned about), smoke detectors (up to date), small pets (1 small pet- discussed risks- 15 lb terrier), grab bars (yes), stairs (yes but has chair lift if needed- not using yet), life-alert system (has one in the home) Hearing Difficulties: -patient declines  Fall Risk: None  Fall Risk  01/30/2020 01/26/2017 10/08/2015 10/30/2014  Falls in the past year? 0 No No No  Number falls in past yr: 0 - - -  Injury with Fall? 0 - - -   Opioid use history:  no long term opioids use Self assessment of health status: "good except for weight"  Required Immunizations needed today:  Discussed shingrix. Flu shot today. Discussed potential covid 19 booster. He will send [place] date of 2nd covid 19 immuniation Immunization History  Administered Date(s) Administered  . Fluad Quad(high Dose 65+) 03/25/2019  . Influenza Split 02/13/2013  . Influenza,inj,Quad PF,6+ Mos 02/08/2016,  01/26/2017  . Influenza-Unspecified 02/13/2014, 03/14/2018  . Moderna SARS-COVID-2 Vaccination 05/20/2019  . Pneumococcal Conjugate-13 03/25/2019  . Td 01/25/2007  . Tdap 05/26/2017  . Zoster 03/21/2016   Health Maintenance  Topic Date Due  . COVID-19 Vaccine (2 - Moderna 2-dose series) 06/17/2019  . Flu Shot  12/15/2019  . Pneumonia vaccines (2 of 2 - PPSV23) 03/24/2020  . Colon Cancer Screening  05/03/2025  . Tetanus Vaccine  05/27/2027  .  Hepatitis C: One time screening is recommended by Center for Disease Control  (CDC) for  adults born from [birth year] through 1967.   Completed  . HIV Screening  Completed    Screening tests-  1. Colon cancer screening- 05/04/2015 with 10 year repeat 2. Lung Cancer screening- not a candidate 3. Skin cancer screening- has never seen dermatology. Denies spots on his skin he is concerned about. Sunscreen encouraged 4. Prostate cancer screening- low risk prior PSA trend. Dad with prostate cancer- we will check toda Lab Results  Component Value Date   PSA 1.0 07/06/2018   PSA 1.17 11/10/2017   PSA 1.42 10/27/2016   The following were reviewed and entered/updated in epic: Past Medical History:  Diagnosis Date  . Anal fissure   . Arthritis   . DDD (degenerative disc disease)   . Family history of malignant neoplasm of gastrointestinal tract    mother  . Fatty liver   . GERD (gastroesophageal reflux disease)   . Glaucoma   . Gout   . Hemorrhoids   . Hypertension   . Rectal bleeding   . Sleep apnea   . Stroke [facility])    in right eye   Patient Active Problem List   Diagnosis Date Noted  . Cervical arthritis 05/26/2017    Priority: Medium  . Elevated blood sugar 08/10/2015    Priority: Medium  . BPH associated with nocturia 01/12/2015    Priority: Medium  . Essential hypertension 11/27/2009    Priority: Medium  . Obstructive sleep apnea 10/29/2009    Priority: Medium  . Gout 01/25/2007    Priority: Medium  . Diarrhea 01/27/2015     Priority: Low  . Back pain with right-sided sciatica 09/02/2013    Priority: Low  . GERD (gastroesophageal reflux disease) 02/18/2011    Priority: Low  . Morbid obesity (HCC) 04/02/2009    Priority: Low  . ERECTILE DYSFUNCTION, ORGANIC 01/25/2007    Priority: Low  . Venous stasis dermatitis 05/21/2018  . Plantar fasciitis 05/11/2018  . Trigger middle finger of left hand 03/31/2018   Past Surgical History:  Procedure Laterality Date  . EYE SURGERY Right    laser   . GLAUCOMA SURGERY Bilateral   . LUMBAR DISC SURGERY     L5  . PYLOROPLASTY     pyloric stenosis, as infant  . TONSILLECTOMY      Family History  Problem Relation Age of Onset  . Colon cancer Mother   . Breast cancer Mother   . Prostate cancer Father   . Testicular cancer Father   . Stomach cancer Paternal Grandmother   . Diabetes Maternal Grandmother   . Heart disease Maternal Grandmother   . Heart disease Maternal Grandfather   . Other Son        "stomach aneurysm"    Medications- reviewed and updated Current Outpatient Medications  Medication Sig Dispense Refill  . allopurinol (ZYLOPRIM) 300 MG tablet Take 1 tablet (300 mg total) by mouth daily. 90 tablet 3  . aspirin 81 MG tablet Take 81 mg by mouth daily.      . colchicine 0.6 MG tablet Take 2 tablets at first sign of gout flare then repeat in 2 hours 1 tablet, take 1 tablet a day until resolved (or 10 days maximum). 90 tablet 3  . Diclofenac Sodium (PENNSAID) 2 % SOLN Place 1 application onto the skin 2 (two) times daily. (Patient taking differently: Place 1 application onto the skin as needed. ) 112 g 2  . losartan-hydrochlorothiazide (HYZAAR) 100-12.5 MG tablet Take 1 tablet by mouth daily. 90 tablet 3  . Multiple Vitamin (MULTIVITAMIN) tablet Take 1 tablet by mouth. Once or twice a week    . omeprazole (PRILOSEC) 40 MG capsule TAKE 1 CAPSULE BY MOUTH EVERY MORNING. 90 capsule 3  . sildenafil (REVATIO) 20 MG tablet Take 2-5 tablets as needed once  every 48 hours for erectile dysfunction 50 tablet 3   No current facility-administered medications for this visit.    Allergies-reviewed and updated No Known Allergies  Social History   Socioeconomic History  . Marital status: Married    Spouse name: Isabelle Course  . Number of children: 3  . Years of education: Not on file  . Highest education level: Not on file  Occupational History  . 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  Tobacco Use  . Smoking status: Former Smoker    Packs/day: 1.00    Years: 20.00    Pack years: 20.00    Types: Cigarettes    Quit date: 05/17/1991    Years since quitting: 28.7  . Smokeless tobacco: Never Used  Substance and Sexual Activity  . Alcohol use: Yes    Alcohol/week: 2.0 standard drinks    Types: 2 Standard drinks or equivalent per week    Comment: Social use   . Drug use: No  . Sexual activity: Yes  Other Topics Concern  . Not on file  Social History Narrative   Married- lost wife Isabelle Course Dec 2017n liver transplant HCV- he was tested and negative. 2 children prior marriage.       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       Hobbies: out to eat, shopping   Volleyball, cycling, basketball in past.    Family history researc   Social Determinants of Health   Financial Resource Strain:   . Difficulty of Paying Living Expenses: Not on file  Food Insecurity:   . Worried About Programme researcher, broadcasting/film/video in the Last Year: Not on file  . Ran Out of Food in the Last Year: Not on file  Transportation Needs:   . Lack of Transportation (Medical): Not on file  . Lack of Transportation (Non-Medical): Not on file  Physical Activity:   . Days of Exercise per Week: Not on file  . Minutes of Exercise per Session: Not on file  Stress:   .  Feeling of Stress : Not on file  Social Connections:   . Frequency of Communication with Friends and Family: Not on file  . Frequency of Social Gatherings with Friends and Family: Not on file  . Attends  Religious Services: Not on file  . Active Member of Clubs or Organizations: Not on file  . Attends Banker Meetings: Not on file  . Marital Status: Not on file   Objective  Objective:  BP 128/76   Pulse 76   Temp 97.6 F (36.4 C) (Temporal)   Ht 6' (1.829 m)   Wt (!) 332 lb (150.6 kg)   SpO2 97%   BMI 45.03 kg/m  Gen: NAD, resting comfortably HEENT: Mucous membranes are moist. Oropharynx normal Neck: no thyromegaly CV: RRR no murmurs rubs or gallops Lungs: CTAB no crackles, wheeze, rhonchi Abdomen: soft/nontender/nondistended/normal bowel sounds. No rebound or guarding.  Ext: no edema Skin: warm, dry Neuro: grossly normal, moves all extremities, PERRLA    Assessment and Plan:   Welcome to Medicare exam completed-  1. Educated, counseled and referred based on above elements 2. Educated, counseled and referred as appropriate for preventative needs 3. Discussed and documented a written plan for preventiative services and screenings with personalized health advice- After Visit Summary was given to patient which included this plan  4. EKG offered [id number]- patient declines for now  Status of chronic or acute concerns   #HTN well controlled- continue current meds- losartan hctz 100-12.5mg   #HLD- reviewed newer guidelines of LDL under 70- update lipids and he wants to do coronary calcium scoring if LDL over 70 and risk above 7.5%. also get aspirin 81mg   #Gout- stable on allopurinol without flares  #had been on celebrex fo rknee issues- warned of renal risks. Can use voltaren gel  #gerd on omeprazole 40mg - discussed trying f0mg  but he declines for now  #ED_ refill sildenafil  Recommended follow up: 6 month follow up    Lab/Order associations:   ICD-10-CM   1. Preventative health care  Z00.00   2. Former smoker  Z87.891 A[employer]A MEDICARE SCREENING  3. Screening for AAA (abdominal aortic aneurysm)  Z13.6 [employer]A MEDICARE SCREENING  4. Idiopathic chronic  gout without tophus, unspecified site  M1A.00X0 Uric acid  5. High risk medication use  Z79.899 Vitamin B12  6. Screening for prostate cancer  Z12.5 PSA  7. Elevated blood sugar  R73.9 Hemoglobin A1c  8. Essential hypertension  I10 CBC With Differential/Platelet    COMPLETE METABOLIC PANEL WITH GFR    Lipid Panel (Refl)    losartan-hydrochlorothiazide (HYZAAR) 100-12.5 MG tablet  9. Nocturia  R35.1 PSA    Meds ordered this encounter  Medications  . omeprazole (PRILOSEC) 40 MG capsule    Sig: TAKE 1 CAPSULE BY MOUTH EVERY MORNING.    Dispense:  90 capsule    Refill:  3  . allopurinol (ZYLOPRIM) 300 MG tablet    Sig: Take 1 tablet (300 mg total) by mouth daily.    Dispense:  90 tablet    Refill:  3  . colchicine 0.6 MG tablet    Sig: Take 2 tablets at first sign of gout flare then repeat in 2 hours 1 tablet, take 1 tablet a day until resolved (or 10 days maximum).    Dispense:  90 tablet    Refill:  3  . losartan-hydrochlorothiazide (HYZAAR) 100-12.5 MG tablet    Sig: Take 1 tablet by mouth daily.    Dispense:  90 tablet  Refill:  3  . sildenafil (REVATIO) 20 MG tablet    Sig: Take 2-5 tablets as needed once every 48 hours for erectile dysfunction    Dispense:  50 tablet    Refill:  3    Return precautions advised. Tana Conch, MD

## 2020-01-31 ENCOUNTER — Telehealth: Payer: Self-pay | Admitting: *Deleted

## 2020-01-31 LAB — CBC WITH DIFFERENTIAL/PLATELET
Absolute Monocytes: 505 cells/uL (ref 200–950)
Basophils Absolute: 61 cells/uL (ref 0–200)
Basophils Relative: 0.6 %
Eosinophils Absolute: 222 cells/uL (ref 15–500)
Eosinophils Relative: 2.2 %
HCT: 41.5 % (ref 38.5–50.0)
Hemoglobin: 13.8 g/dL (ref 13.2–17.1)
Lymphs Abs: 5515 cells/uL — ABNORMAL HIGH (ref 850–3900)
MCH: 31.4 pg (ref 27.0–33.0)
MCHC: 33.3 g/dL (ref 32.0–36.0)
MCV: 94.5 fL (ref 80.0–100.0)
MPV: 11.5 fL (ref 7.5–12.5)
Monocytes Relative: 5 %
Neutro Abs: 3798 cells/uL (ref 1500–7800)
Neutrophils Relative %: 37.6 %
Platelets: 193 10*3/uL (ref 140–400)
RBC: 4.39 10*6/uL (ref 4.20–5.80)
RDW: 13 % (ref 11.0–15.0)
Total Lymphocyte: 54.6 %
WBC: 10.1 10*3/uL (ref 3.8–10.8)

## 2020-01-31 LAB — COMPLETE METABOLIC PANEL WITH GFR
AG Ratio: 1.8 (calc) (ref 1.0–2.5)
ALT: 25 U/L (ref 9–46)
AST: 30 U/L (ref 10–35)
Albumin: 4.5 g/dL (ref 3.6–5.1)
Alkaline phosphatase (APISO): 64 U/L (ref 35–144)
BUN: 15 mg/dL (ref 7–25)
CO2: 28 mmol/L (ref 20–32)
Calcium: 9.6 mg/dL (ref 8.6–10.3)
Chloride: 102 mmol/L (ref 98–110)
Creat: 0.9 mg/dL (ref 0.70–1.25)
GFR, Est African American: 104 mL/min/{1.73_m2} (ref >=60)
GFR, Est Non African American: 89 mL/min/{1.73_m2} (ref >=60)
Globulin: 2.5 g/dL (calc) (ref 1.9–3.7)
Glucose, Bld: 89 mg/dL (ref 65–99)
Potassium: 4.5 mmol/L (ref 3.5–5.3)
Sodium: 140 mmol/L (ref 135–146)
Total Bilirubin: 0.9 mg/dL (ref 0.2–1.2)
Total Protein: 7 g/dL (ref 6.1–8.1)

## 2020-01-31 LAB — LIPID PANEL (REFL)
Cholesterol: 177 mg/dL (ref <200)
HDL: 46 mg/dL (ref >=40)
LDL Cholesterol (Calc): 103 mg/dL (calc) — ABNORMAL HIGH
Non-HDL Cholesterol (Calc): 131 mg/dL (calc) — ABNORMAL HIGH (ref <130)
Total CHOL/HDL Ratio: 3.8 (calc) (ref <5.0)
Triglycerides: 166 mg/dL — ABNORMAL HIGH (ref <150)

## 2020-01-31 LAB — HEMOGLOBIN A1C
Hgb A1c MFr Bld: 5.9 % of total Hgb — ABNORMAL HIGH (ref <5.7)
Mean Plasma Glucose: 123 (calc)
eAG (mmol/L): 6.8 (calc)

## 2020-01-31 LAB — URIC ACID: Uric Acid, Serum: 6.8 mg/dL (ref 4.0–8.0)

## 2020-01-31 LAB — PSA: PSA: 1.03 ng/mL (ref <=4.0)

## 2020-01-31 LAB — VITAMIN B12: Vitamin B-12: 1255 pg/mL — ABNORMAL HIGH (ref 200–1100)

## 2020-01-31 NOTE — Telephone Encounter (Signed)
FYI: Pt will try diet and exercise first, do not want to start Metformin at this time. Will F/U in 6 month    Date of birth verified by patient  Lab results given,Pt verbalized understanding   You are at risk for diabetes with hemoglobin a1c of 5.8 (at risk from 5.7-6.4). Healthy eating, regular exercise, weight loss advised.   The point-of-care A1c tends to run lower than the phlebotomy A1c so this may be as high as 6.3-very close to diabetes-need to be aggressive about dietary changes and weight loss. We can also start Metformin 500 mg daily #30 with 5 refill if you would like to help lower your risk of diabetes

## 2020-01-31 NOTE — Telephone Encounter (Signed)
Interestingly they were able to run the A1c on the phlebotomy check in point of care and phlebotomy checks were rather close-since both levels are under 6 we should stay off Metformin anyway I believe so I think working on lifestyle is the right choice

## 2020-02-19 ENCOUNTER — Ambulatory Visit
Admission: RE | Admit: 2020-02-19 | Discharge: 2020-02-19 | Disposition: A | Discharge status: Home / Self Care | Payer: Medicare Other | Source: Ambulatory Visit | Attending: Family Medicine | Admitting: Family Medicine

## 2020-02-19 DIAGNOSIS — Z136 Encounter for screening for cardiovascular disorders: Secondary | ICD-10-CM

## 2020-02-19 DIAGNOSIS — Z87891 Personal history of nicotine dependence: Secondary | ICD-10-CM

## 2020-03-06 ENCOUNTER — Other Ambulatory Visit: Payer: Self-pay

## 2020-03-06 ENCOUNTER — Ambulatory Visit (INDEPENDENT_AMBULATORY_CARE_PROVIDER_SITE_OTHER): Payer: Medicare Other | Admitting: Physician Assistant

## 2020-03-06 ENCOUNTER — Encounter: Payer: Self-pay | Admitting: Physician Assistant

## 2020-03-06 VITALS — BP 138/80 | HR 79 | Temp 98.4°F | Ht 72.0 in | Wt 336.5 lb

## 2020-03-06 DIAGNOSIS — M79671 Pain in right foot: Secondary | ICD-10-CM | POA: Diagnosis not present

## 2020-03-06 MED ORDER — DICLOFENAC SODIUM 75 MG PO TBEC: 75.0000 mg | DELAYED_RELEASE_TABLET | Freq: Two times a day (BID) | ORAL | 0 refills | Status: DC

## 2020-03-06 NOTE — Patient Instructions (Signed)
It was great to see you!  Start oral diclofenac -- take twice daily.  This will replace ibuprofen.  An order for an xray has been put in for you. To get your xray, you can walk in at the [facility] location without a scheduled appointment.  The address is [address] It is across the street from [facility]. X-ray is located in the basement.  Hours of operation are M-F 8:30am to 5:00pm. Please note that they are closed for lunch between 12:30 and 1:00pm.  I will be in touch with your xray results and the plan!  Have a great weekend!

## 2020-03-06 NOTE — Progress Notes (Signed)
Ross [name]. is a 64 y.o. male here for a new problem.  I acted as a Neurosurgeon for [employer], PA-C Corky Mull, LPN   History of Present Illness:   Chief Complaint  Patient presents with  . Foot Pain    HPI   Foot pain Pt c/o right foot pain on the outer side of foot, swollen. Pt is taking Advil with some relief. Takes 2-3 in the morning, and has done this for most days in the past several weeks. Takes daily prilosec. Has hx of gout but this doesn't feel that way. He is able to bear weight but has some pain if walking for too long. Cannot recall but may have been wearing flip flops and everted his foot at one point -- unsure of exact date.   Past Medical History:  Diagnosis Date  . Anal fissure   . Arthritis   . DDD (degenerative disc disease)   . Family history of malignant neoplasm of gastrointestinal tract    mother  . Fatty liver   . GERD (gastroesophageal reflux disease)   . Glaucoma   . Gout   . Hemorrhoids   . Hypertension   . Rectal bleeding   . Sleep apnea   . Stroke [facility])    in right eye     Social History   Tobacco Use  . Smoking status: Former Smoker    Packs/day: 1.00    Years: 20.00    Pack years: 20.00    Types: Cigarettes    Quit date: 05/17/1991    Years since quitting: 28.8  . Smokeless tobacco: Never Used  Substance Use Topics  . Alcohol use: Yes    Alcohol/week: 2.0 standard drinks    Types: 2 Standard drinks or equivalent per week    Comment: Social use   . Drug use: No    Past Surgical History:  Procedure Laterality Date  . EYE SURGERY Right    laser   . GLAUCOMA SURGERY Bilateral   . LUMBAR DISC SURGERY     L5  . PYLOROPLASTY     pyloric stenosis, as infant  . TONSILLECTOMY      Family History  Problem Relation Age of Onset  . Colon cancer Mother   . Breast cancer Mother   . Prostate cancer Father   . Testicular cancer Father   . Stomach cancer Paternal Grandmother   . Diabetes Maternal Grandmother   . Heart  disease Maternal Grandmother   . Heart disease Maternal Grandfather   . Other Son        "stomach aneurysm"    No Known Allergies  Current Medications:   Current Outpatient Medications:  .  allopurinol (ZYLOPRIM) 300 MG tablet, Take 1 tablet (300 mg total) by mouth daily., Disp: 90 tablet, Rfl: 3 .  aspirin 81 MG tablet, Take 81 mg by mouth daily.  , Disp: , Rfl:  .  colchicine 0.6 MG tablet, Take 2 tablets at first sign of gout flare then repeat in 2 hours 1 tablet, take 1 tablet a day until resolved (or 10 days maximum)., Disp: 90 tablet, Rfl: 3 .  losartan-hydrochlorothiazide (HYZAAR) 100-12.5 MG tablet, Take 1 tablet by mouth daily., Disp: 90 tablet, Rfl: 3 .  Multiple Vitamin (MULTIVITAMIN) tablet, Take 1 tablet by mouth. Once or twice a week, Disp: , Rfl:  .  omeprazole (PRILOSEC) 40 MG capsule, TAKE 1 CAPSULE BY MOUTH EVERY MORNING., Disp: 90 capsule, Rfl: 3 .  sildenafil (REVATIO)  20 MG tablet, Take 2-5 tablets as needed once every 48 hours for erectile dysfunction, Disp: 50 tablet, Rfl: 3 .  diclofenac (VOLTAREN) 75 MG EC tablet, Take 1 tablet (75 mg total) by mouth 2 (two) times daily., Disp: 30 tablet, Rfl: 0   Review of Systems:   ROS  Negative unless otherwise specified per HPI.  Vitals:   Vitals:   03/06/20 1521  BP: 138/80  Pulse: 79  Temp: 98.4 F (36.9 C)  TempSrc: Temporal  SpO2: 96%  Weight: (!) 336 lb 8 oz (152.6 kg)  Height: 6' (1.829 m)     Body mass index is 45.64 kg/m.  Physical Exam:   Physical Exam Vitals and nursing note reviewed.  Constitutional:      Appearance: He is well-developed.  HENT:     Head: Normocephalic.  Eyes:     Conjunctiva/sclera: Conjunctivae normal.     Pupils: Pupils are equal, round, and reactive to light.  Cardiovascular:     Pulses:          Dorsalis pedis pulses are 2+ on the right side.       Posterior tibial pulses are 2+ on the right side.  Pulmonary:     Effort: Pulmonary effort is normal.   Musculoskeletal:        General: Normal range of motion.     Cervical back: Normal range of motion.     Comments: Generalized swelling 1+ to entire foot No significant erythema Has TTP to lateral edge of foot Normal ROM   Skin:    General: Skin is warm and dry.  Neurological:     Mental Status: He is alert and oriented to person, place, and time.  Psychiatric:        Behavior: Behavior normal.        Thought Content: Thought content normal.        Judgment: Judgment normal.       Assessment and Plan:   Lyden was seen today for foot pain.  Diagnoses and all orders for this visit:  Right foot pain No red flags on exam.  Possible stress fracture or sprain? Recommend oral diclofenac, continue prilosec. Stop ibuprofen. Xray ordered, he will get early next week. May refer to sports medicine if indicated. If worsening pain, recommend possible immobilization but do not feel like that is warranted at this time. -     DG Foot Complete Right; Future  Other orders -     diclofenac (VOLTAREN) 75 MG EC tablet; Take 1 tablet (75 mg total) by mouth 2 (two) times daily.   CMA or LPN served as scribe during this visit. History, Physical, and Plan performed by medical provider. The above documentation has been reviewed and is accurate and complete.  Jarold Motto, PA-C

## 2020-03-10 ENCOUNTER — Ambulatory Visit (INDEPENDENT_AMBULATORY_CARE_PROVIDER_SITE_OTHER)
Admission: RE | Admit: 2020-03-10 | Discharge: 2020-03-10 | Disposition: A | Discharge status: Home / Self Care | Payer: Medicare Other | Source: Ambulatory Visit | Attending: Physician Assistant | Admitting: Physician Assistant

## 2020-03-10 ENCOUNTER — Other Ambulatory Visit: Payer: Self-pay

## 2020-03-10 DIAGNOSIS — M79671 Pain in right foot: Secondary | ICD-10-CM | POA: Diagnosis not present

## 2020-03-13 ENCOUNTER — Other Ambulatory Visit: Payer: Self-pay

## 2020-03-13 DIAGNOSIS — M79671 Pain in right foot: Secondary | ICD-10-CM

## 2020-03-16 NOTE — Progress Notes (Signed)
I,Prarthana Parlin,acting as a scribe for Clementeen Graham, MD.  Subjective:    I'm seeing this patient as a consultation for PCP Jarold Motto, referred on 03/06/20. Note will be routed back to referring provider/PCP.  Pt is a 64yo male being seen today cc lat R foot pain x approximately one month w/ no specific MOI.  He states that it could have been related to possibly rolling his foot while wearing flip flops but is unsure of this. He locates his R foot pain to his R lateral foot and R mid dorsal foot.  Swelling was present upon initial assessment. Pt was taking Advil with some relief.  PCP recommended oral Diclofenac, continue Prilosec and stop IBU. Pt has PMHx of gout. He is able to bear weight, but has some pain if walking for long periods.  Aggravating factors include his first few steps after sitting for a period of time and same with first getting out of bed in the morning.     Dx Imaging: 03/10/20 R foot Xray. There is no evidence of fracture or dislocation. Mild degenerative change of the first metatarsal phalangeal joint and interphalangeal joint, as well as scattered throughout the digits. There is minor midfoot degenerative spurring. Small plantar calcaneal spur and Achilles tendon enthesophyte.   Past medical history, Surgical history, Family history, Social history, Allergies, and medications have been entered into the medical record, reviewed.   Review of Systems: No new headache, visual changes, nausea, vomiting, diarrhea, constipation, dizziness, abdominal pain, skin rash, fevers, chills, night sweats, weight loss, swollen lymph nodes, body aches, joint swelling, muscle aches, chest pain, shortness of breath, mood changes, visual or auditory hallucinations.   Objective:    Vitals:   03/17/20 1304  BP: 120/86  Pulse: 83  SpO2: 95%   General: Well Developed, well nourished, and in no acute distress.  Neuro/Psych: Alert and oriented x3, extra-ocular muscles intact, able to move  all 4 extremities, sensation grossly intact. Skin: Warm and dry, no rashes noted.  Respiratory: Not using accessory muscles, speaking in full sentences, trachea midline.  Cardiovascular: Pulses palpable, no extremity edema. Abdomen: Does not appear distended. MSK: Right foot and leg edema and venous stasis changes present in the distal leg. Right foot swollen at dorsal lateral foot without erythema. Tender palpation proximal fifth metatarsal and proximal dorsal fourth metatarsal and midfoot. Normal foot and ankle motion. Stable ligamentous exam with some guarding. Intact strength.   Pulses cap refill and sensation are intact distally.  Lab and Radiology Results EXAM: RIGHT FOOT COMPLETE - 3+ VIEW  COMPARISON:  None.  FINDINGS: There is no evidence of fracture or dislocation. Mild degenerative change of the first metatarsal phalangeal joint and interphalangeal joint, as well as scattered throughout the digits. There is minor midfoot degenerative spurring. Small plantar calcaneal spur and Achilles tendon enthesophyte. No bony destruction or periosteal reaction there is mild generalized soft tissue edema. No soft tissue air. No radiopaque foreign body.  IMPRESSION: 1. Mild generalized soft tissue edema. 2. Mild osteoarthritis in the midfoot and forefoot. Small plantar calcaneal spur and Achilles tendon enthesophyte.   Electronically Signed   By: Narda Rutherford M.D.   On: 03/13/2020 00:36 I, Clementeen Graham, personally (independently) visualized and performed the interpretation of the images attached in this note.   Diagnostic Limited MSK Ultrasound of: Right foot normal-appearing bone cortex fourth and fifth metatarsals. Hypoechoic fluid tracking subcutaneous tissue dorsal foot consistent with edema. Intact peroneal tendon and fifth metatarsal Impression: Edema  Impression and Recommendations:    Assessment and Plan: 64 y.o. male with foot pain with unclear etiology.   Pain has been ongoing for approximately a month without clear injury pattern.  He did invert his ankle but cannot recall when that occurred in relationship to his pain.  He thinks the pain started spontaneously.  He has been increasing his activity a bit.  X-ray and ultrasound do not show a clear cause of pain however the most likely explanation is edema causing majority of pain.  Some risk for stress fracture.  After discussion with patient we will treat for stress fracture and edema with compression stocking and postop shoe.  If not improving after about 2 weeks patient will notify me we will proceed with MRI.  If better or improving recheck in 4 weeks.Marland Kitchen  PDMP not reviewed this encounter. Orders Placed This Encounter  Procedures  . [place] LIMITED JOINT SPACE STRUCTURES LOW RIGHT(NO LINKED CHARGES)    Order Specific Question:   Reason for Exam (SYMPTOM  OR DIAGNOSIS REQUIRED)    Answer:   R foot pain    Order Specific Question:   Preferred imaging location?    Answer:   [facility]   No orders of the defined types were placed in this encounter.   Discussed warning signs or symptoms. Please see discharge instructions. Patient expresses understanding.   The above documentation has been reviewed and is accurate and complete Clementeen Graham, M.D.

## 2020-03-17 ENCOUNTER — Encounter: Payer: Self-pay | Admitting: Pulmonary Disease

## 2020-03-17 ENCOUNTER — Ambulatory Visit (INDEPENDENT_AMBULATORY_CARE_PROVIDER_SITE_OTHER): Payer: Medicare Other | Admitting: Pulmonary Disease

## 2020-03-17 ENCOUNTER — Other Ambulatory Visit: Payer: Self-pay

## 2020-03-17 ENCOUNTER — Ambulatory Visit: Payer: Self-pay

## 2020-03-17 ENCOUNTER — Ambulatory Visit (INDEPENDENT_AMBULATORY_CARE_PROVIDER_SITE_OTHER): Payer: Medicare Other | Admitting: Family Medicine

## 2020-03-17 ENCOUNTER — Encounter: Payer: Self-pay | Admitting: Family Medicine

## 2020-03-17 VITALS — BP 128/80 | HR 70 | Temp 97.2°F | Ht 72.0 in | Wt 342.2 lb

## 2020-03-17 VITALS — BP 120/86 | HR 83 | Ht 72.0 in | Wt 341.0 lb

## 2020-03-17 DIAGNOSIS — R609 Edema, unspecified: Secondary | ICD-10-CM

## 2020-03-17 DIAGNOSIS — M79671 Pain in right foot: Secondary | ICD-10-CM | POA: Diagnosis not present

## 2020-03-17 DIAGNOSIS — G4733 Obstructive sleep apnea (adult) (pediatric): Secondary | ICD-10-CM | POA: Diagnosis not present

## 2020-03-17 NOTE — Patient Instructions (Signed)
Trial of airfit F 30 full face mask for better seal Next visit at [employer] office

## 2020-03-17 NOTE — Assessment & Plan Note (Signed)
Very compliant and CPAP has certainly helped improve his daytime somnolence and fatigue. He has a large leak which is likely related to a perfect seal around his nose. I recommended AirFit F30 full facemask to see if we can decrease leak  Weight loss encouraged, compliance with goal of at least 4-6 hrs every night is the expectation. Advised against medications with sedative side effects Cautioned against driving when sleepy - understanding that sleepiness will vary on a day to day basis

## 2020-03-17 NOTE — Progress Notes (Signed)
   Subjective:    Patient ID: Ross Babe., male    DOB: 07-19-1955, 64 y.o.   MRN: [id number]  HPI  64 yo for FU ofsevere OSA -On CPAP 13 cm full facemask  Chief Complaint  Patient presents with  . Follow-up    Pt states he has been doing well since last visit. Pt does wear his CPAP every night. DME: [facility]   And will follow up. He has been unable to lose weight. He has right knee osteoarthritis requiring injections and bone spurs for which she is seeing orthopedics.  He reports being well rested, denies problems with mask or pressure. CPAP download was reviewed which shows large leak, good control of events and excellent compliance more than 7 hours every night on 13 cm  Significant tests/ events reviewed  PSG 2011 AHI 19/hr  Home sleep study January 2017 with AHI of 42.4/hr , low sat 61% 4/2017CPAP >>13 cm of H2O  Review of Systems neg for any significant sore throat, dysphagia, itching, sneezing, nasal congestion or excess/ purulent secretions, fever, chills, sweats, unintended wt loss, pleuritic or exertional cp, hempoptysis, orthopnea pnd or change in chronic leg swelling. Also denies presyncope, palpitations, heartburn, abdominal pain, nausea, vomiting, diarrhea or change in bowel or urinary habits, dysuria,hematuria, rash, arthralgias, visual complaints, headache, numbness weakness or ataxia.     Objective:   Physical Exam  Gen. Pleasant, obese, in no distress ENT - no lesions, no post nasal drip Neck: No JVD, no thyromegaly, no carotid bruits Lungs: no use of accessory muscles, no dullness to percussion, decreased without rales or rhonchi  Cardiovascular: Rhythm regular, heart sounds  normal, no murmurs or gallops, no peripheral edema Musculoskeletal: No deformities, no cyanosis or clubbing , no tremors       Assessment & Plan:

## 2020-03-17 NOTE — Assessment & Plan Note (Signed)
Weight loss encouraged 

## 2020-03-17 NOTE — Patient Instructions (Signed)
Thank you for coming in today.  Plan for compression stocking and post op shoe.  If that is not helpful try cam walker boot.   If not improving in 2 weeks let me know and I will order an MRI to evaluate for stress fracture and tendonitis/tendon tear.   Keep me updated.   If improving but not better recheck in 4 weeks.

## 2020-03-18 ENCOUNTER — Other Ambulatory Visit: Payer: Self-pay | Admitting: Physician Assistant

## 2020-04-13 NOTE — Progress Notes (Signed)
   I, Ross [name], LAT, ATC, am serving as scribe for Dr. Clementeen [name].  Ross [name]. is a 64 y.o. male who presents to [facility] today for f/u R foot pain ongoing for approx past 2 months w/ no known MOI. Pt was last seen by Dr. Denyse [name] on 03/17/20 and was advised to treat for stress fracture and edema with compression stocking and postop shoe. Of note, pt does have PMHx of gout. Today, pt reports that his R foot is feeling better but he notes that the location of his pain has changed to being more along his R ant and lateral foot.  He con't to have issues w/ swelling in his R foot that gets worse as the day progresses.  He also asks about obesity management.  He is interested in registered dietitian or even considering bariatric surgery.  He has tried and failed to lose and sustain weight loss many times in his life.  Dx Imaging: 03/10/20 R foot XR   Pertinent review of systems: No fevers or chills  Relevant historical information: Sleep apnea obesity GERD hypertension gout and morbid obesity   Exam:  BP 140/82 (BP Location: Right Arm, Patient Position: Sitting, Cuff Size: Large)   Pulse 77   Ht 6' (1.829 m)   Wt (!) 343 lb 3.2 oz (155.7 kg)   SpO2 95%   BMI 46.55 kg/m  General: Well Developed, well nourished, and in no acute distress.   MSK: Right foot normal gait.  Mildly tender palpation dorsal lateral foot     Assessment and Plan: 64 y.o. male with right foot pain improving.  Discussed options.  Plan for watchful waiting with continued home exercise program.  If not improving or if worsening next step would be MRI foot.  Patient will keep me updated.  Morbid obesity with medical cofactors.  Discussed options.  Recommended [facility] for referral to registered dietitian.  Also discussed bariatric surgery options.  Recommend following up with PCP for this.    Discussed warning signs or symptoms. Please see  discharge instructions. Patient expresses understanding.   The above documentation has been reviewed and is accurate and complete Ross [name], M.D.  Total encounter time 20 minutes including face-to-face time with the patient and, reviewing past medical record, and charting on the date of service.

## 2020-04-14 ENCOUNTER — Other Ambulatory Visit: Payer: Self-pay

## 2020-04-14 ENCOUNTER — Ambulatory Visit (INDEPENDENT_AMBULATORY_CARE_PROVIDER_SITE_OTHER): Payer: Medicare Other | Admitting: Family Medicine

## 2020-04-14 ENCOUNTER — Encounter: Payer: Self-pay | Admitting: Family Medicine

## 2020-04-14 VITALS — BP 140/82 | HR 77 | Ht 72.0 in | Wt 343.2 lb

## 2020-04-14 DIAGNOSIS — M79671 Pain in right foot: Secondary | ICD-10-CM

## 2020-04-14 NOTE — Patient Instructions (Addendum)
Thank you for coming in today.  Plan to continue conservative management.  If worsening or not improving in 4-6 weeks let me know.  Next step would be a foot MRI.  If better or almost all the way better probably no need for MRI at all.   For weight/food.   Consider  [facility] [address] [place]. [place],  [place]  [address] Phone:: [phone] Fax: [phone]  I will also ask Dr Durene Cal about a registered dietitian referral closer to [facility].    Also research Gastric Sleeve Bariatric Surgery.

## 2020-04-18 ENCOUNTER — Other Ambulatory Visit: Payer: Self-pay | Admitting: Family Medicine

## 2020-05-23 ENCOUNTER — Other Ambulatory Visit: Payer: Self-pay | Admitting: Family Medicine

## 2020-06-05 ENCOUNTER — Other Ambulatory Visit: Payer: Self-pay | Admitting: Family Medicine

## 2020-07-17 ENCOUNTER — Other Ambulatory Visit: Payer: Self-pay | Admitting: Family Medicine

## 2020-07-31 NOTE — Progress Notes (Signed)
Phone [phone] In person visit   Subjective:   Ross [name]. is a 65 y.o. year old very pleasant male patient who presents for/with See problem oriented charting Chief Complaint  Patient presents with   Hypertension   Gout   This visit occurred during the SARS-CoV-2 public health emergency.  Safety protocols were in place, including screening questions prior to the visit, additional usage of staff PPE, and extensive cleaning of exam room while observing appropriate contact time as indicated for disinfecting solutions.   Past Medical History-  Patient Active Problem List   Diagnosis Date Noted   Cervical arthritis 05/26/2017    Priority: Medium   Elevated blood sugar 08/10/2015    Priority: Medium   BPH associated with nocturia 01/12/2015    Priority: Medium   Essential hypertension 11/27/2009    Priority: Medium   Obstructive sleep apnea 10/29/2009    Priority: Medium   Gout 01/25/2007    Priority: Medium   Diarrhea 01/27/2015    Priority: Low   Back pain with right-sided sciatica 09/02/2013    Priority: Low   GERD (gastroesophageal reflux disease) 02/18/2011    Priority: Low   Morbid obesity (HCC) 04/02/2009    Priority: Low   ERECTILE DYSFUNCTION, ORGANIC 01/25/2007    Priority: Low   Venous stasis dermatitis 05/21/2018   Plantar fasciitis 05/11/2018   Trigger middle finger of left hand 03/31/2018    Medications- reviewed and updated Current Outpatient Medications  Medication Sig Dispense Refill   allopurinol (ZYLOPRIM) 300 MG tablet Take 1 tablet (300 mg total) by mouth daily. 90 tablet 3   aspirin 81 MG tablet Take 81 mg by mouth daily.     diclofenac (VOLTAREN) 75 MG EC tablet TAKE 1 TABLET BY MOUTH TWICE A DAY 30 tablet 0   losartan-hydrochlorothiazide (HYZAAR) 100-12.5 MG tablet Take 1 tablet by mouth daily. 90 tablet 3   Multiple Vitamin (MULTIVITAMIN) tablet Take 1 tablet by mouth. Once or twice a week     sildenafil  (REVATIO) 20 MG tablet Take 2-5 tablets as needed once every 48 hours for erectile dysfunction 50 tablet 3   colchicine 0.6 MG tablet Take 2 tablets at first sign of gout flare then repeat in 2 hours 1 tablet, take 1 tablet a day until resolved (or 10 days maximum). (Patient not taking: No sig reported) 90 tablet 3   omeprazole (PRILOSEC) 20 MG capsule TAKE 1 CAPSULE BY MOUTH EVERY MORNING. 30 capsule 5   No current facility-administered medications for this visit.     Objective:  BP 132/76    Pulse 66    Temp 98.2 F (36.8 C) (Temporal)    Ht 6' (1.829 m)    Wt (!) 337 lb (152.9 kg)    SpO2 93%    BMI 45.71 kg/m  Gen: NAD, resting comfortably CV: RRR no murmurs rubs or gallops Lungs: CTAB no crackles, wheeze, rhonchi Abdomen: soft/nontender/nondistended/normal bowel sounds. .  Ext: 1+ edema stable (doesn't love compression stockings) Skin: warm, dry    Assessment and Plan   #hypertension S: medication: Hyzaar 100-12.5Mg  Home readings #s: intermittent checks 130s/70s BP Readings from Last 3 Encounters:  08/04/20 132/76  04/14/20 140/82  03/17/20 120/86  A/P: Stable. Continue current medications.   #Gout S: 0 flares in last 6 months on Allopurinol 300Mg  Lab Results  Component Value Date   LABURIC 6.8 01/30/2020  A/P:no flares since higher dose of allopurinol - will recheck levels next visit   # GERD  S:Medication: Prilosec 40Mg . Good control.  A/P: Reflux has been well controlled on 40 mg-no recent attempts to try to reduce to 20 mg-we will try that today with 30-day supply-if not effective he will let know and we will change back to a 90-day supply omeprazole 40 mg  # Hyperglycemia/insulin resistance/prediabetes S:  Medication: none Exercise and diet- see below Lab Results  Component Value Date   HGBA1C 5.8 (A) 01/30/2020   HGBA1C 5.9 (H) 01/30/2020   HGBA1C 6.0 11/10/2017   A/P: Continue to work on lifestyle.  We are going to give this another 6 months and  recheck at next visit  # Obesity morbid S:up 5 lbs from last visit. At home was doing nutrasystem but gf had some kidney issues and stopped. Doing some limited walking- thinks he could improve that.   Wt Readings from Last 3 Encounters:  08/04/20 (!) 337 lb (152.9 kg)  04/14/20 (!) 343 lb 3.2 oz (155.7 kg)  03/17/20 (!) 341 lb (154.7 kg)  A/P: discussed reducing caloric intake (cut plate in half or myfitnesspal)  As primary driver for weight loss  Recommended follow up: Return in about 6 months (around 02/04/2021) for follow up- or sooner if needed.  Lab/Order associations:   ICD-10-CM   1. Essential hypertension  I10   2. Idiopathic chronic gout without tophus, unspecified site  M1A.00X0   3. Morbid obesity (HCC)  E66.01   4. Elevated blood sugar  R73.9     Meds ordered this encounter  Medications   omeprazole (PRILOSEC) 20 MG capsule    Sig: TAKE 1 CAPSULE BY MOUTH EVERY MORNING.    Dispense:  30 capsule    Refill:  5   Return precautions advised.  02/06/2021, MD

## 2020-07-31 NOTE — Patient Instructions (Addendum)
Health Maintenance Due  Topic Date Due  . COVID-19 Vaccine (3 - Booster for Moderna series) Will call back with dates. 01/09/2020  . PNA vac Low Risk Adult (2 of 2 - PPSV23)- lets plan on prevnar 20 either at pharmacy or here if we have at next visit 03/24/2020   Reflux has been well controlled on 40 mg-no recent attempts to try to reduce to 20 mg-we will try that today with 30-day supply-if not effective he will let us know and we will change back to a 90-day supply omeprazole 40 mg  Set a goal of losing at least 5 lbs by follow up. Could try the cut plate in half method or myfitnesspal as we discussed. Wait 20 minutes after eating as you may be full and just not know I until after 20 minutes.   Full labs next visit  Recommended follow up: Return in about 6 months (around 02/04/2021) for follow up- or sooner if needed.

## 2020-08-04 ENCOUNTER — Encounter: Payer: Self-pay | Admitting: Family Medicine

## 2020-08-04 ENCOUNTER — Ambulatory Visit (INDEPENDENT_AMBULATORY_CARE_PROVIDER_SITE_OTHER): Payer: Medicare Other | Admitting: Family Medicine

## 2020-08-04 ENCOUNTER — Other Ambulatory Visit: Payer: Self-pay

## 2020-08-04 VITALS — BP 132/76 | HR 66 | Temp 98.2°F | Ht 72.0 in | Wt 337.0 lb

## 2020-08-04 DIAGNOSIS — M1A00X0 Idiopathic chronic gout, unspecified site, without tophus (tophi): Secondary | ICD-10-CM

## 2020-08-04 DIAGNOSIS — R739 Hyperglycemia, unspecified: Secondary | ICD-10-CM

## 2020-08-04 DIAGNOSIS — I10 Essential (primary) hypertension: Secondary | ICD-10-CM | POA: Diagnosis not present

## 2020-08-04 DIAGNOSIS — K219 Gastro-esophageal reflux disease without esophagitis: Secondary | ICD-10-CM

## 2020-08-04 MED ORDER — OMEPRAZOLE 20 MG PO CPDR: DELAYED_RELEASE_CAPSULE | ORAL | 5 refills | Status: AC

## 2020-08-20 ENCOUNTER — Telehealth: Payer: Self-pay

## 2020-08-20 NOTE — Telephone Encounter (Signed)
Vaccine updated

## 2020-08-20 NOTE — Telephone Encounter (Signed)
Pt had 3rd Moderna Booster on 05/07/20.  Pt also states the new dosage for Omeprazole that Dr. Durene Cal put him on is doing great.

## 2020-08-22 ENCOUNTER — Other Ambulatory Visit: Payer: Self-pay | Admitting: Family Medicine

## 2020-10-05 ENCOUNTER — Ambulatory Visit: Payer: Medicare Other | Admitting: Family

## 2020-10-20 ENCOUNTER — Ambulatory Visit: Payer: Medicare Other | Admitting: Family

## 2021-01-28 ENCOUNTER — Telehealth: Payer: Self-pay | Admitting: Family Medicine

## 2021-01-28 NOTE — Telephone Encounter (Signed)
Spoke with patient he request a CB sometime in October 2022

## 2021-02-02 ENCOUNTER — Ambulatory Visit: Payer: Medicare Other | Admitting: Family Medicine

## 2021-03-09 ENCOUNTER — Ambulatory Visit (INDEPENDENT_AMBULATORY_CARE_PROVIDER_SITE_OTHER): Payer: Medicare Other | Admitting: Cardiology

## 2021-03-09 ENCOUNTER — Encounter: Payer: Self-pay | Admitting: Cardiology

## 2021-03-09 VITALS — BP 122/60 | HR 75 | Ht 73.0 in | Wt 339.0 lb

## 2021-03-09 DIAGNOSIS — I10 Essential (primary) hypertension: Secondary | ICD-10-CM | POA: Diagnosis not present

## 2021-03-09 DIAGNOSIS — Z87898 Personal history of other specified conditions: Secondary | ICD-10-CM

## 2021-03-09 DIAGNOSIS — R0789 Other chest pain: Secondary | ICD-10-CM

## 2021-03-09 DIAGNOSIS — E119 Type 2 diabetes mellitus without complications: Secondary | ICD-10-CM | POA: Diagnosis not present

## 2021-03-09 NOTE — Progress Notes (Signed)
Cardiology Office Note  Date: 03/09/2021   ID: Ross Babe., DOB 1956/05/11, MRN [id number]  PCP:  Donetta Potts, MD  Cardiologist:  Nona Dell, MD Electrophysiologist:  None   Chief Complaint  Patient presents with   History of chest pain     History of Present Illness: Ross Peters. is a 65 y.o. male referred for cardiology consultation by Dr. Mayford Knife with history of chest discomfort.  He is here today with his wife.  He tells me that he has experienced an intermittent sense of chest tightness, typically when he lays down at nighttime.  This does not occur every day.  Not definitely associated with reflux either.  The symptoms are nonexertional however.  He just went hiking in the mountains and reports no exertional symptoms with this activity.  He underwent reassuring cardiac testing at [facility] back in August including echocardiogram and exercise Myoview, results noted below.  I discussed these results with him today.  Cardiac risk factors include obesity, type 2 diabetes mellitus, and hypertension.  His most recent LDL was 85.  Hemoglobin A1c 6.5% on metformin.  I talked with him about risk factor reduction in general, also weight loss, or walking plan for exercise.  I personally reviewed his ECG today which shows normal sinus rhythm.  I reviewed his medications as noted below.  Past Medical History:  Diagnosis Date   Anal fissure    Arthritis    DDD (degenerative disc disease)    GERD (gastroesophageal reflux disease)    Glaucoma    Gout    Hemorrhoids    Hypertension    OSA on CPAP    Rectal bleeding    Type 2 diabetes mellitus (HCC)     Past Surgical History:  Procedure Laterality Date   EYE SURGERY Right    laser    GLAUCOMA SURGERY Bilateral    LUMBAR DISC SURGERY     L5   PYLOROPLASTY     pyloric stenosis, as infant   TONSILLECTOMY      Current Outpatient Medications  Medication Sig Dispense Refill   allopurinol  (ZYLOPRIM) 300 MG tablet Take 1 tablet (300 mg total) by mouth daily. 90 tablet 3   amLODipine (NORVASC) 10 MG tablet Take 10 mg by mouth daily.     aspirin 81 MG tablet Take 81 mg by mouth daily.     colchicine 0.6 MG tablet Take 2 tablets at first sign of gout flare then repeat in 2 hours 1 tablet, take 1 tablet a day until resolved (or 10 days maximum). 90 tablet 3   diclofenac (VOLTAREN) 75 MG EC tablet TAKE 1 TABLET BY MOUTH TWICE A DAY 30 tablet 0   latanoprost (XALATAN) 0.005 % ophthalmic solution 1 drop at bedtime.     losartan-hydrochlorothiazide (HYZAAR) 100-12.5 MG tablet Take 1 tablet by mouth daily. 90 tablet 3   metFORMIN (GLUCOPHAGE-XR) 500 MG 24 hr tablet Take 500 mg by mouth 2 (two) times daily.     Multiple Vitamin (MULTIVITAMIN) tablet Take 1 tablet by mouth. Once or twice a week     omeprazole (PRILOSEC) 20 MG capsule TAKE 1 CAPSULE BY MOUTH EVERY MORNING. 30 capsule 5   sildenafil (REVATIO) 20 MG tablet Take 2-5 tablets as needed once every 48 hours for erectile dysfunction 50 tablet 3   No current facility-administered medications for this visit.   Allergies:  Patient has no known allergies.   Social History: The patient  reports  that he quit smoking about 29 years ago. His smoking use included cigarettes. He has a 20.00 pack-year smoking history. He has never used smokeless tobacco. He reports current alcohol use of about 2.0 standard drinks per week. He reports that he does not use drugs.   Family History: The patient's family history includes Breast cancer in his mother; Colon cancer in his mother; Diabetes in his maternal grandmother; Heart disease in his maternal grandfather and maternal grandmother; Other in his son; Prostate cancer in his father; Stomach cancer in his paternal grandmother; Testicular cancer in his father.   ROS: No palpitations or syncope.  Physical Exam: VS:  BP 122/60 (BP Location: Left Arm, Patient Position: Sitting, Cuff Size: Large)   Pulse  75   Ht 6\' 1"  (1.854 m)   Wt (!) 339 lb (153.8 kg)   SpO2 98%   BMI 44.73 kg/m , BMI Body mass index is 44.73 kg/m.  Wt Readings from Last 3 Encounters:  03/09/21 (!) 339 lb (153.8 kg)  08/04/20 (!) 337 lb (152.9 kg)  04/14/20 (!) 343 lb 3.2 oz (155.7 kg)    General: Patient appears comfortable at rest. HEENT: Conjunctiva and lids normal, wearing a mask. Neck: Supple, no elevated JVP or carotid bruits, no thyromegaly. Lungs: Clear to auscultation, nonlabored breathing at rest. Cardiac: Regular rate and rhythm, no S3 or significant systolic murmur, no pericardial rub. Abdomen: Soft, nontender, bowel sounds present. Extremities: Venous stasis changes, distal pulses 2+. Skin: Warm and dry. Musculoskeletal: No kyphosis. Neuropsychiatric: Alert and oriented x3, affect grossly appropriate.  ECG:  An ECG dated 08/10/2015 was personally reviewed today and demonstrated:  Sinus rhythm.  Recent Labwork:    Component Value Date/Time   CHOL 177 01/30/2020 1559   TRIG 166 (H) 01/30/2020 1559   HDL 46 01/30/2020 1559   CHOLHDL 3.8 01/30/2020 1559   VLDL 36.2 11/10/2017 0851   LDLCALC 103 (H) 01/30/2020 1559  May 2022: Potassium 3.8, BUN 17, creatinine 1.09, AST 23, ALT 44, cholesterol 149, triglycerides 103, HDL 43, LDL 85, hemoglobin 13.3, platelets 158 September 2022: Hemoglobin A1c 6.5%  Other Studies Reviewed Today:  Abdominal ultrasound 02/19/2020: FINDINGS: Abdominal aortic measurements as follows:   Proximal:  2.3 cm   Mid:  2.1 cm   Distal:  2.2 cm   IMPRESSION: No abdominal aortic aneurysm  Echocardiogram 08/05/2020: [facility]): Summary    1. Technically difficult study.    2. The left ventricle is normal in size with mildly increased wall  thickness.    3. The left ventricular systolic function is normal, LVEF is visually  estimated at 60-65%.    4. There is grade I diastolic dysfunction (impaired relaxation).    5. The left atrium is mildly dilated in size.     6. The right ventricle is normal in size, with normal systolic function.    7. There is insufficient TR signal pulmonary hypertension.    Exercise Myoview 01/13/2021 [facility]): Negative ECG for ischemia and stage II, 90% MPHR and 6.8 METS. 1. Normal myocardial perfusion study..   2. Normal left ventricular wall motion.   3. Left ventricular ejection fraction 65%   4. Non invasive risk stratification*: Low   Assessment and Plan:  1.  History of atypical chest pain in a 65 year old male with history of obesity, hypertension, type 2 diabetes mellitus, OSA on CPAP, and recent LDL 85.  He underwent reassuring and relatively recent cardiac structural and ischemic testing as noted above.  Symptoms are nonexertional.  I talked with him about weight loss and walking plan for exercise.  He is currently on aspirin, Hyzaar, and Norvasc.  I asked him to discuss potential medication adjustments with Dr. Mayford Knife including consideration of SGLT2 inhibitor (either as adjunct or replacement for metformin) and also low-dose statin therapy in light of diabetes mellitus.  These would serve to further reduce his cardiovascular risk going forward.  In addition, I talked with him about warning signs including progressive chest pain, particularly onset with exertion that would prompt further evaluation.  Next step for him would most likely be a cardiac catheterization if symptoms worsen.  We will arrange office follow-up.  2.  Essential hypertension, blood pressure is well controlled today on current regimen including Norvasc and Hyzaar.  3.  OSA on CPAP.  Medication Adjustments/Labs and Tests Ordered: Current medicines are reviewed at length with the patient today.  Concerns regarding medicines are outlined above.   Tests Ordered: Orders Placed This Encounter  Procedures   EKG 12-Lead     Medication Changes: No orders of the defined types were placed in this encounter.   Disposition:  Follow up   6 months.  Signed, Jonelle Sidle, MD, [facility] 03/09/2021 11:12 AM    [facility] at [facility] [address] [place], [place], [place] [address] Phone: [phone]; Fax: [phone]

## 2021-03-09 NOTE — Patient Instructions (Addendum)

## 2021-03-30 ENCOUNTER — Encounter: Payer: Self-pay | Admitting: Pulmonary Disease

## 2021-03-30 ENCOUNTER — Other Ambulatory Visit: Payer: Self-pay

## 2021-03-30 ENCOUNTER — Ambulatory Visit (INDEPENDENT_AMBULATORY_CARE_PROVIDER_SITE_OTHER): Payer: Medicare Other | Admitting: Pulmonary Disease

## 2021-03-30 DIAGNOSIS — I10 Essential (primary) hypertension: Secondary | ICD-10-CM

## 2021-03-30 DIAGNOSIS — G4733 Obstructive sleep apnea (adult) (pediatric): Secondary | ICD-10-CM | POA: Diagnosis not present

## 2021-03-30 NOTE — Addendum Note (Signed)
Addended by: Dorisann Frames R on: 03/30/2021 11:41 AM   Modules accepted: Orders

## 2021-03-30 NOTE — Patient Instructions (Signed)
CPAP is working well on current settings Supplies will be renewed

## 2021-03-30 NOTE — Assessment & Plan Note (Signed)
CPAP download was reviewed which shows excellent control of events on 13 cm with large leak.  He is very compliant and has not missed a single night, average about 7 hours per night. CPAP is working well, supplies will be renewed for a year He was interested in alternative therapies including inspire device and we discussed pathophysiology and how this device works. He will unfortunately not be a candidate due to very high BMI  Weight loss encouraged, compliance with goal of at least 4-6 hrs every night is the expectation. Advised against medications with sedative side effects Cautioned against driving when sleepy - understanding that sleepiness will vary on a day to day basis

## 2021-03-30 NOTE — Assessment & Plan Note (Signed)
Controlled on 2 medications 

## 2021-03-30 NOTE — Progress Notes (Signed)
   Subjective:    Patient ID: Ross [name]., male    DOB: 25-Nov-1955, 65 y.o.   MRN: [id number]  HPI  65  yo for FU of  severe OSA -On CPAP 13 cm full facemask  Annual FU visit He has settled down with his fullface mask he tried a nasal mask but it was leaking through his mouth.  He denies any dryness, wakes up feeling rested, no hypersomnolence or fatigue during the day. Occasionally he will fall asleep in the recliner and will wake up choking which is a reminder for him that OSA is still a problem. Weight is unchanged DME is [employer]  Blood pressures controlled on 2 medications  Significant tests/ events reviewed PSG 2011 AHI 19/hr  Home sleep study January 2017 with AHI of 42.4/hr , low sat 61% 08/2015  CPAP >> 13 cm of H2O   Review of Systems neg for any significant sore throat, dysphagia, itching, sneezing, nasal congestion or excess/ purulent secretions, fever, chills, sweats, unintended wt loss, pleuritic or exertional cp, hempoptysis, orthopnea pnd or change in chronic leg swelling. Also denies presyncope, palpitations, heartburn, abdominal pain, nausea, vomiting, diarrhea or change in bowel or urinary habits, dysuria,hematuria, rash, arthralgias, visual complaints, headache, numbness weakness or ataxia.     Objective:   Physical Exam  Gen. Pleasant, obese, in no distress ENT - no lesions, no post nasal drip Neck: No JVD, no thyromegaly, no carotid bruits Lungs: no use of accessory muscles, no dullness to percussion, decreased without rales or rhonchi  Cardiovascular: Rhythm regular, heart sounds  normal, no murmurs or gallops, no peripheral edema Musculoskeletal: No deformities, no cyanosis or clubbing , no tremors        Assessment & Plan:

## 2021-04-16 IMAGING — US US ABDOMINAL AORTA SCREENING AAA
1 series · 14 of 17 positions shown · non-contrast
Comparison: None.

CLINICAL DATA: Screening exam.

EXAM:
US ABDOMINAL AORTA MEDICARE SCREENING
TECHNIQUE: Ultrasound examination of the abdominal aorta was performed as a
screening evaluation for abdominal aortic aneurysm.

[Series 1: us abdominal aorta screening aaa · 0.31mm/px · 14 of 17 slices shown]
[im 1/17]
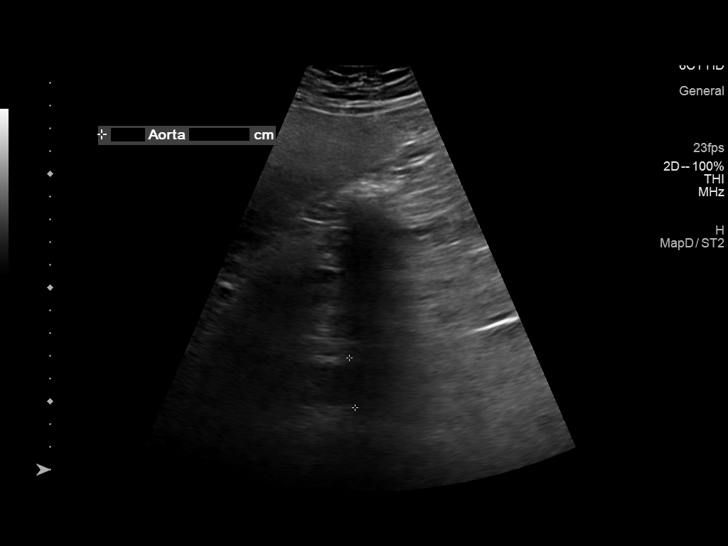
[im 2/17]
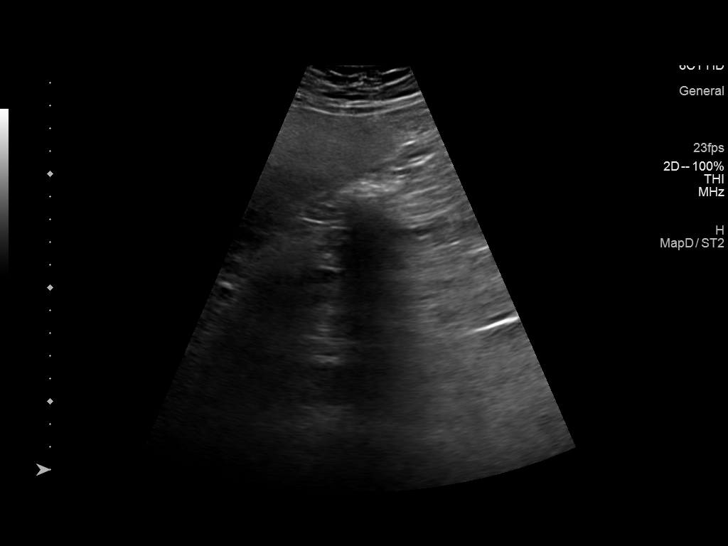
[im 4/17]
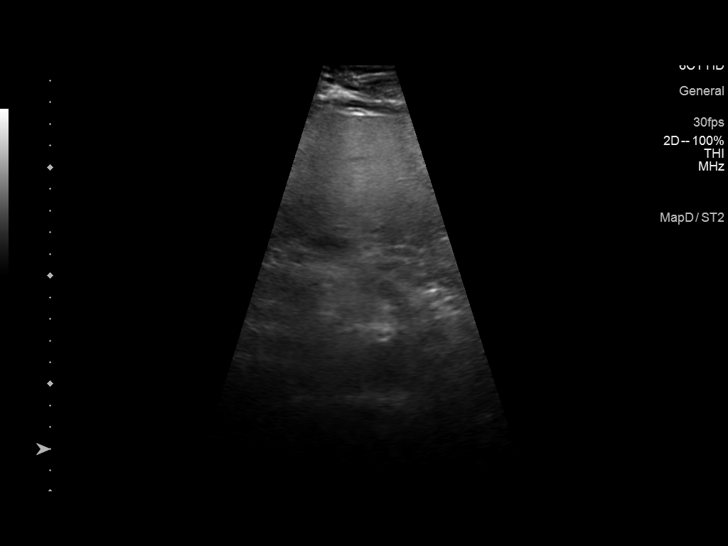
[im 5/17]
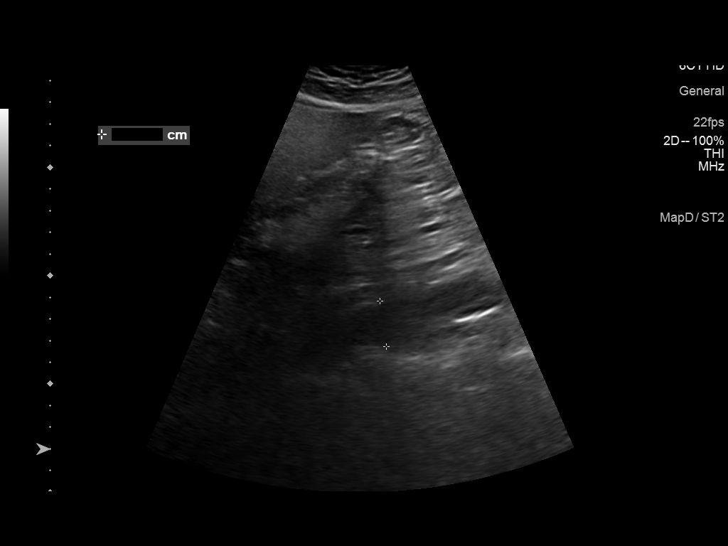
[im 6/17]
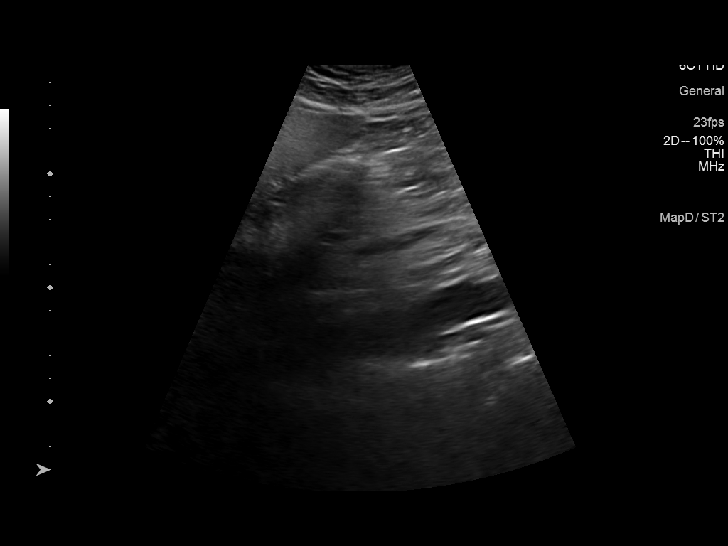
[im 7/17]
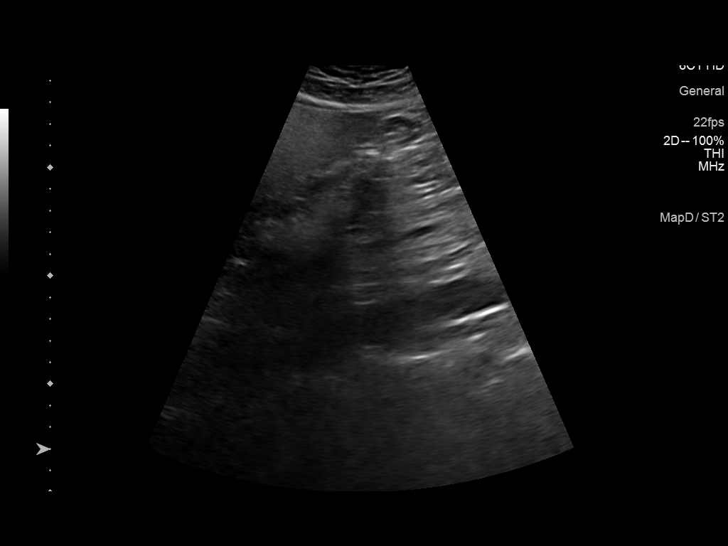
[im 8/17]
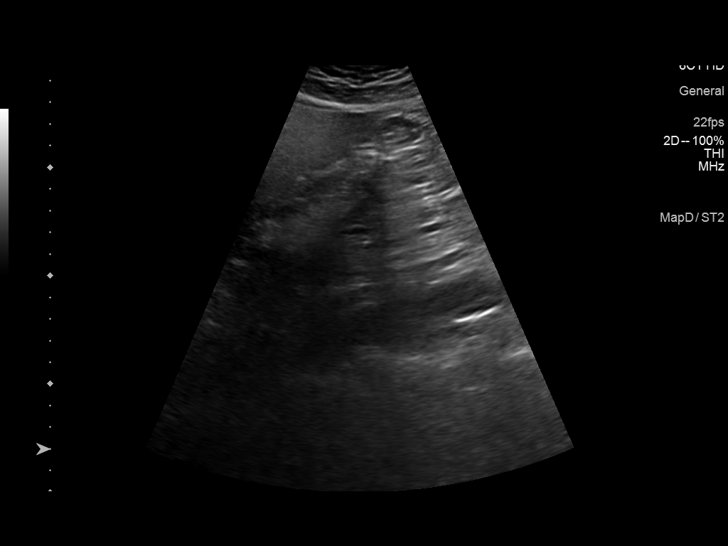
[im 10/17]
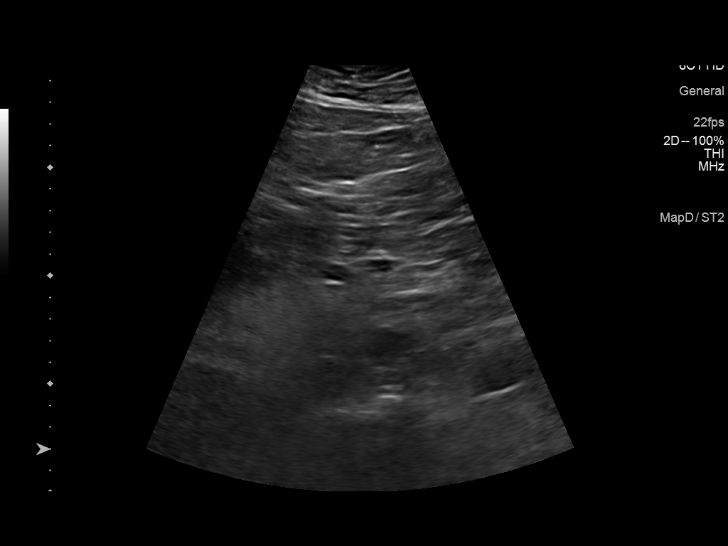
[im 11/17]
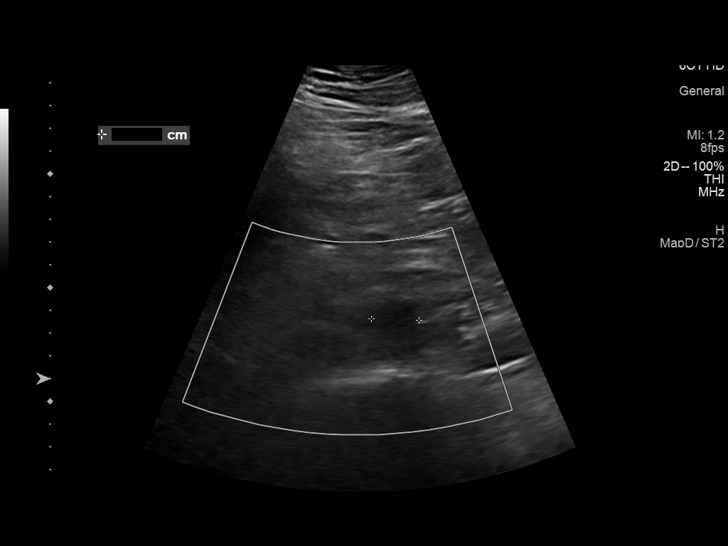
[im 12/17]
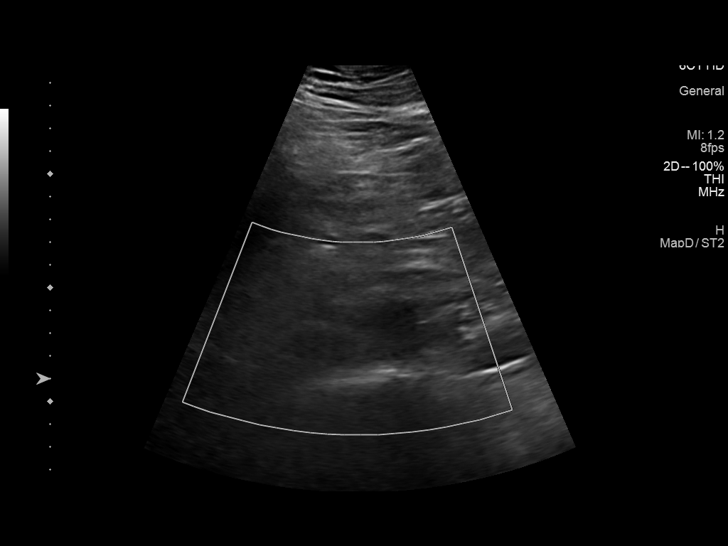
[im 13/17]
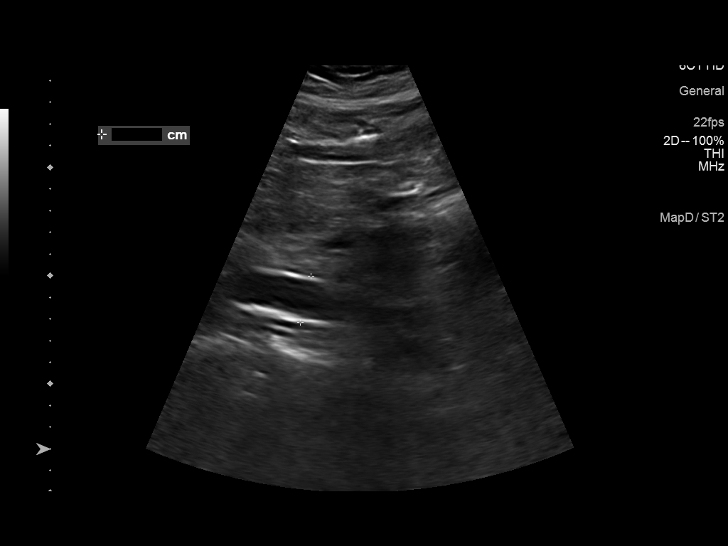
[im 14/17]
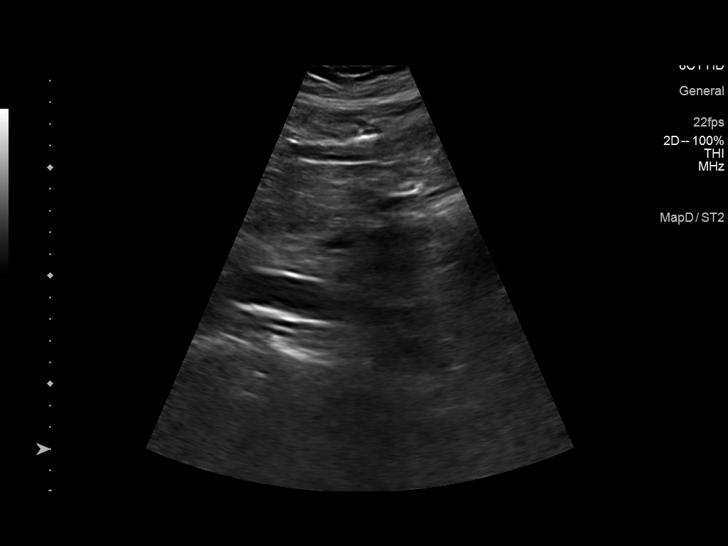
[im 16/17]
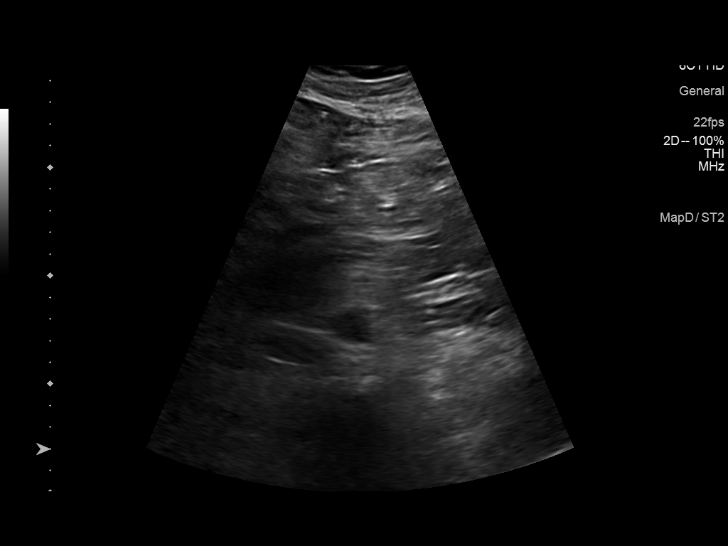
[im 17/17]
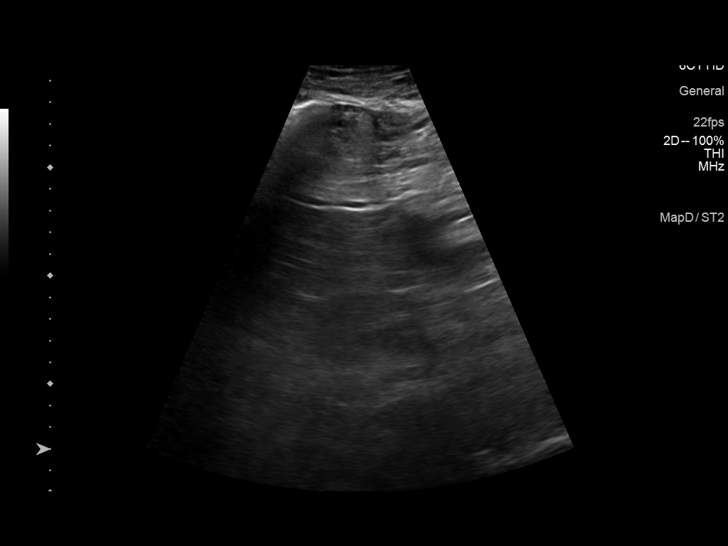

[14 of 17 positions shown; findings below may reference images not displayed]

FINDINGS: Abdominal aortic measurements as follows:

Proximal:  2.3 cm

Mid:  2.1 cm

Distal:  2.2 cm
IMPRESSION: No abdominal aortic aneurysm

## 2022-02-28 ENCOUNTER — Ambulatory Visit (INDEPENDENT_AMBULATORY_CARE_PROVIDER_SITE_OTHER): Payer: Medicare Other | Admitting: Pulmonary Disease

## 2022-02-28 ENCOUNTER — Ambulatory Visit: Payer: Medicare Other | Attending: Cardiology | Admitting: Cardiology

## 2022-02-28 ENCOUNTER — Encounter: Payer: Self-pay | Admitting: Pulmonary Disease

## 2022-02-28 ENCOUNTER — Encounter: Payer: Self-pay | Admitting: Cardiology

## 2022-02-28 VITALS — BP 128/76 | HR 85 | Ht 72.0 in | Wt 336.6 lb

## 2022-02-28 DIAGNOSIS — I10 Essential (primary) hypertension: Secondary | ICD-10-CM | POA: Diagnosis present

## 2022-02-28 DIAGNOSIS — Z87898 Personal history of other specified conditions: Secondary | ICD-10-CM | POA: Diagnosis present

## 2022-02-28 DIAGNOSIS — G4733 Obstructive sleep apnea (adult) (pediatric): Secondary | ICD-10-CM | POA: Diagnosis not present

## 2022-02-28 DIAGNOSIS — E785 Hyperlipidemia, unspecified: Secondary | ICD-10-CM | POA: Diagnosis present

## 2022-02-28 DIAGNOSIS — E1169 Type 2 diabetes mellitus with other specified complication: Secondary | ICD-10-CM | POA: Diagnosis present

## 2022-02-28 NOTE — Assessment & Plan Note (Signed)
CPAP download was reviewed which shows excellent control of events and 13 cm, good compliance more than 7 hours every night with mild leak. He has settled down with a fullface mask and we shall continue.  CPAP supplies will be renewed for a year Weight loss encouraged, compliance with goal of at least 4-6 hrs every night is the expectation. Advised against medications with sedative side effects Cautioned against driving when sleepy - understanding that sleepiness will vary on a day to day basis

## 2022-02-28 NOTE — Progress Notes (Signed)
   Subjective:    Patient ID: Ross Comment., male    DOB: [birth year], 66 y.o.   MRN: [id number]  HPI  66 yo for FU of  severe OSA -On CPAP 13 cm full facemask   Annual follow-up visit. He denies sleep pressure, feels rested when he wakes up in the mornings.  He has settled down with a fullface mask.  He is diligent about getting his supplies on time and states that this has helped with decreasing the leak around his mask No problems with pressure.  Miouth dryness has resolved with using a fullface mask Hypertension is well controlled, he has an appointment with cardiology today  Significant tests/ events reviewed  PSG 2011 AHI 19/hr  Home sleep study January 2017 with AHI of 42.4/hr , low sat 61% 08/2015  CPAP >> 13 cm of H2O  Review of Systems neg for any significant sore throat, dysphagia, itching, sneezing, nasal congestion or excess/ purulent secretions, fever, chills, sweats, unintended wt loss, pleuritic or exertional cp, hempoptysis, orthopnea pnd or change in chronic leg swelling. Also denies presyncope, palpitations, heartburn, abdominal pain, nausea, vomiting, diarrhea or change in bowel or urinary habits, dysuria,hematuria, rash, arthralgias, visual complaints, headache, numbness weakness or ataxia.     Objective:   Physical Exam  Gen. Pleasant, obese, in no distress ENT - no lesions, no post nasal drip Neck: No JVD, no thyromegaly, no carotid bruits Lungs: no use of accessory muscles, no dullness to percussion, decreased without rales or rhonchi  Cardiovascular: Rhythm regular, heart sounds  normal, no murmurs or gallops, no peripheral edema Musculoskeletal: No deformities, no cyanosis or clubbing , no tremors       Assessment & Plan:

## 2022-02-28 NOTE — Progress Notes (Signed)
Cardiology Office Note  Date: 02/28/2022   ID: Ross Babe., DOB [birth year], MRN [id number]  PCP:  Donetta Potts, MD  Cardiologist:  Nona Dell, MD Electrophysiologist:  None   Chief Complaint  Patient presents with   Cardiac follow-up    History of Present Illness: Ross [name]. is a 66 y.o. male last seen in October 2022.  He is here for a routine visit.  Reports no exertional chest pain or unusual breathlessness with typical activities.  Remains very active in retirement.  He has been struggling to lose any weight however.  I reviewed his medications which are outlined below.  Recently started on Crestor by Dr. Mayford Knife.  Requesting interval lab work for review.  LDL was 85 last year, he does have type 2 diabetes mellitus as cardiac risk equivalent.  I personally reviewed his ECG today which shows normal sinus rhythm.  He follows with Dr. Vassie Loll for management of OSA on CPAP.  Past Medical History:  Diagnosis Date   Anal fissure    Arthritis    DDD (degenerative disc disease)    GERD (gastroesophageal reflux disease)    Glaucoma    Gout    Hemorrhoids    Hypertension    OSA on CPAP    Rectal bleeding    Type 2 diabetes mellitus (HCC)     Past Surgical History:  Procedure Laterality Date   EYE SURGERY Right    laser    GLAUCOMA SURGERY Bilateral    LUMBAR DISC SURGERY     L5   PYLOROPLASTY     pyloric stenosis, as infant   TONSILLECTOMY      Current Outpatient Medications  Medication Sig Dispense Refill   allopurinol (ZYLOPRIM) 300 MG tablet Take 1 tablet (300 mg total) by mouth daily. 90 tablet 3   amLODipine (NORVASC) 10 MG tablet Take 10 mg by mouth daily.     aspirin 81 MG tablet Take 81 mg by mouth daily.     colchicine 0.6 MG tablet Take 2 tablets at first sign of gout flare then repeat in 2 hours 1 tablet, take 1 tablet a day until resolved (or 10 days maximum). 90 tablet 3   losartan-hydrochlorothiazide (HYZAAR) 100-12.5 MG  tablet Take 1 tablet by mouth daily. 90 tablet 3   metFORMIN (GLUCOPHAGE-XR) 500 MG 24 hr tablet Take 500 mg by mouth 2 (two) times daily.     Multiple Vitamin (MULTIVITAMIN) tablet Take 1 tablet by mouth. Once or twice a week     omeprazole (PRILOSEC) 20 MG capsule TAKE 1 CAPSULE BY MOUTH EVERY MORNING. 30 capsule 5   rosuvastatin (CRESTOR) 5 MG tablet Take 5 mg by mouth daily.     sildenafil (REVATIO) 20 MG tablet Take 2-5 tablets as needed once every 48 hours for erectile dysfunction 50 tablet 3   No current facility-administered medications for this visit.   Allergies:  Patient has no known allergies.   ROS: No palpitations or unexplained syncope.  Physical Exam: VS:  BP 128/76 (BP Location: Left Arm, Patient Position: Sitting, Cuff Size: Large)   Pulse 85   Ht 6' (1.829 m)   Wt (!) 336 lb 9.6 oz (152.7 kg)   SpO2 93%   BMI 45.65 kg/m , BMI Body mass index is 45.65 kg/m.  Wt Readings from Last 3 Encounters:  02/28/22 (!) 336 lb 9.6 oz (152.7 kg)  02/28/22 (!) 336 lb (152.4 kg)  03/30/21 (!) 340 lb 6.4 oz (  154.4 kg)    General: Patient appears comfortable at rest. HEENT: Conjunctiva and lids normal. Neck: Supple, no elevated JVP or carotid bruits. Lungs: Clear to auscultation, nonlabored breathing at rest. Cardiac: Regular rate and rhythm, no S3 or significant systolic murmur, no pericardial rub. Abdomen: Soft, nontender, bowel sounds present. Extremities: No pitting edema.  ECG:  An ECG dated 03/09/2021 was personally reviewed today and demonstrated:  Sinus rhythm.  Recent Labwork:    Component Value Date/Time   CHOL 177 01/30/2020 1559   TRIG 166 (H) 01/30/2020 1559   HDL 46 01/30/2020 1559   CHOLHDL 3.8 01/30/2020 1559   VLDL 36.2 11/10/2017 0851   LDLCALC 103 (H) 01/30/2020 1559  May 2022: Hemoglobin 13.3, platelets 158, potassium 3.8, BUN 17, creatinine 1.09, AST 23, ALT 44, cholesterol 149, triglycerides 103, HDL 43, LDL 85, hemoglobin A1c 7%  Other Studies  Reviewed Today:  Echocardiogram 08/05/2020: [facility]): Summary    1. Technically difficult study.    2. The left ventricle is normal in size with mildly increased wall  thickness.    3. The left ventricular systolic function is normal, LVEF is visually  estimated at 60-65%.    4. There is grade I diastolic dysfunction (impaired relaxation).    5. The left atrium is mildly dilated in size.    6. The right ventricle is normal in size, with normal systolic function.    7. There is insufficient TR signal pulmonary hypertension.     Exercise Myoview 01/13/2021 [facility]): Negative ECG for ischemia and stage II, 90% MPHR and 6.8 METS. 1. Normal myocardial perfusion study..   2. Normal left ventricular wall motion.   3. Left ventricular ejection fraction 65%   4. Non invasive risk stratification*: Low   Assessment and Plan:  1.  History of atypical chest pain without recurrence in the last year in the setting of reassuring ischemic work-up in August 2022.  ECG is normal today.  Plan to focus on risk factor modification and observation for now.  We did discuss weight loss.  He is on low-dose aspirin and I agree with addition of Crestor.  Requesting interval lab work from Dr. Jimmye Norman for review.  2.  Essential hypertension, blood pressure is reasonably controlled today.  He is on Norvasc and Hyzaar.  3.  OSA on CPAP, followed by Dr. Elsworth Soho.  Medication Adjustments/Labs and Tests Ordered: Current medicines are reviewed at length with the patient today.  Concerns regarding medicines are outlined above.   Tests Ordered: Orders Placed This Encounter  Procedures   EKG 12-Lead    Medication Changes: No orders of the defined types were placed in this encounter.   Disposition:  Follow up  1 year.  Signed, Satira Sark, MD, [facility] 02/28/2022 2:10 PM  [place] at [place], [place], [place] [address] Phone: [phone]; Fax: [phone]

## 2022-02-28 NOTE — Patient Instructions (Addendum)

## 2022-02-28 NOTE — Patient Instructions (Signed)
X CPAP supplies will be renewed x 1 year 

## 2022-02-28 NOTE — Assessment & Plan Note (Signed)
Well controlled on losartan. 

## 2022-04-19 ENCOUNTER — Telehealth: Payer: Self-pay | Admitting: Pulmonary Disease

## 2022-04-19 DIAGNOSIS — G4733 Obstructive sleep apnea (adult) (pediatric): Secondary | ICD-10-CM

## 2022-04-20 NOTE — Telephone Encounter (Signed)
We need to know if an Rx for pt to receive a new cpap was supposed to have been sent or if this was just supposed to have been an order to renew cpap supplies.   Attempted to call pt but unable to reach. Left message for him to return call.

## 2022-04-20 NOTE — Telephone Encounter (Signed)
Spoke with pt who states he needed C-Pap supplies order sent to [facility] [employer]. Order was placed. Nothing further needed at this time.

## 2022-08-25 ENCOUNTER — Telehealth: Payer: Self-pay | Admitting: Pulmonary Disease

## 2022-08-25 NOTE — Telephone Encounter (Signed)
Pt. Calling his cpap machine as said a message that its reached the end of its life and was told to call us to let Dr. Ashley Mariner what he advises he uses [facility] In [place]

## 2022-08-29 NOTE — Telephone Encounter (Signed)
Called and spoke with patient. Patient verbalized understanding. Patien stated he will keep using the one he has and it he has any trouble out of it, then he will give [place] a call.   Nothing further needed.

## 2022-08-29 NOTE — Telephone Encounter (Signed)
ATC patient. LVMTCB. 

## 2022-09-15 ENCOUNTER — Telehealth (HOSPITAL_BASED_OUTPATIENT_CLINIC_OR_DEPARTMENT_OTHER): Payer: Self-pay | Admitting: Pulmonary Disease

## 2022-09-15 NOTE — Telephone Encounter (Signed)
Patient called to cancel his appointment and stated that he is interested in a CPAP machine but wants to check with insurance prior. Sending as an Financial planner for Dr. Vassie Loll based on prior 4/11 telephone encounter.

## 2022-09-21 ENCOUNTER — Ambulatory Visit (HOSPITAL_BASED_OUTPATIENT_CLINIC_OR_DEPARTMENT_OTHER): Payer: Medicare Other | Admitting: Pulmonary Disease

## 2022-10-06 DIAGNOSIS — K921 Melena: Secondary | ICD-10-CM | POA: Insufficient documentation

## 2022-10-17 ENCOUNTER — Encounter: Payer: Self-pay | Admitting: Nurse Practitioner

## 2022-10-17 ENCOUNTER — Ambulatory Visit (INDEPENDENT_AMBULATORY_CARE_PROVIDER_SITE_OTHER): Payer: Medicare Other | Admitting: Nurse Practitioner

## 2022-10-17 VITALS — BP 138/78 | HR 68 | Temp 97.8°F | Ht 73.0 in | Wt 325.6 lb

## 2022-10-17 DIAGNOSIS — G4733 Obstructive sleep apnea (adult) (pediatric): Secondary | ICD-10-CM

## 2022-10-17 NOTE — Assessment & Plan Note (Signed)
Severe OSA, on CPAP. Excellent compliance and control on current settings. He does have moderate leaks but they are not bothersome and do not appear to be affecting his breakthrough events.  Receives benefit from use.  Due for new machine.  Will place orders for new CPAP 13 cmH2O with mask of choice and heated humidity.  Understands proper care/use of device.  Aware of safe driving practices.  Patient Instructions  Continue to use CPAP every night, minimum of 4-6 hours a night.  Change equipment every 30 days or as directed by DME. Wash your tubing with warm soap and water daily, hang to dry. Wash humidifier portion weekly. Use fresh bottled distilled water.  Be aware of reduced alertness and do not drive or operate heavy machinery if experiencing this or drowsiness.  Notify if persistent daytime sleepiness occurs even with consistent use of CPAP.  Orders sent for new CPAP machine   Follow up in 10-12 weeks with Dr. Vassie Loll or Philis Nettle, or sooner, if needed

## 2022-10-17 NOTE — Patient Instructions (Signed)
Continue to use CPAP every night, minimum of 4-6 hours a night.  Change equipment every 30 days or as directed by DME. Wash your tubing with warm soap and water daily, hang to dry. Wash humidifier portion weekly. Use fresh bottled distilled water.  Be aware of reduced alertness and do not drive or operate heavy machinery if experiencing this or drowsiness.  Notify if persistent daytime sleepiness occurs even with consistent use of CPAP.  Orders sent for new CPAP machine   Follow up in 10-12 weeks with Dr. Vassie Loll or Philis Nettle, or sooner, if needed

## 2022-10-17 NOTE — Assessment & Plan Note (Signed)
BMI 42.9. Healthy weight loss encouraged.

## 2022-10-17 NOTE — Progress Notes (Signed)
@Patient  ID: Ross [name]., male    DOB: 1955-06-03, 67 y.o.   MRN: [id number]  Chief Complaint  Patient presents with   Follow-up    Needs follow up to get new CPAP machine. Doing well.    Referring provider: Donetta Potts, MD  HPI: 67 year old male, former smoker followed for OSA on CPAP. He is a patient of Dr. Reginia Naas and last seen in office 02/28/2022. Past medical history significant for HTN, OSA, GERD, obesity, gout, ED, BPH.   TEST/EVENTS: 2011 PSG: AHI 19/h 05/2015 HST: AHI 42.4/h, SpO2 low 61% 08/2015 CPAP titration >> 13 cmH2O  02/28/2022: OV with Dr. Vassie Loll. Annual visit follow up. Settled down with fullface mask. No problems with pressure. Feels rested. HTN well controlled. CPAP supplies renewed x 1 year. Excellent control on download.   10/17/2022: Today - follow up Patient presents today for follow up. He has been doing well with his CPAP since he was here last but he started getting a motor life warning. The machine is currently still working but he wants to go ahead and get a new one because he's worried that he won't be able to sleep if something happens to his CPAP. He sleeps with it nightly. Doesn't think he could sleep without it. Wakes feeling rested in the mornings. He doesn't notice any bothersome leaks. Doesn't have any trouble with excess daytime sleepiness or drowsy driving. He's getting married in September, which he is very excited about.   09/17/2022-10/16/2022: CPAP 13 cmH2O 30/30 days; 100% >4 hr; average use 7 hr 36 min Leaks 95th 65.1 AHI 1.3  No Known Allergies  Immunization History  Administered Date(s) Administered   Fluad Quad(high Dose 65+) 03/25/2019, 01/30/2020, 02/24/2021   Influenza Split 02/13/2013   Influenza,inj,Quad PF,6+ Mos 02/08/2016, 01/26/2017   Influenza-Unspecified 02/13/2014, 03/14/2018   Moderna Sars-Covid-2 Vaccination 06/06/2019, 07/12/2019, 05/07/2020   Pneumococcal Conjugate-13 03/25/2019   Td 01/25/2007   Tdap  05/26/2017   Zoster, Live 03/21/2016    Past Medical History:  Diagnosis Date   Anal fissure    Arthritis    DDD (degenerative disc disease)    GERD (gastroesophageal reflux disease)    Glaucoma    Gout    Hemorrhoids    Hypertension    OSA on CPAP    Rectal bleeding    Type 2 diabetes mellitus (HCC)     Tobacco History: Social History   Tobacco Use  Smoking Status Former   Packs/day: 1.00   Years: 20.00   Additional pack years: 0.00   Total pack years: 20.00   Types: Cigarettes   Quit date: 05/17/1991   Years since quitting: 31.4   Passive exposure: Never  Smokeless Tobacco Never   Counseling given: Not Answered   Outpatient Medications Prior to Visit  Medication Sig Dispense Refill   allopurinol (ZYLOPRIM) 300 MG tablet Take 1 tablet (300 mg total) by mouth daily. 90 tablet 3   amLODipine (NORVASC) 10 MG tablet Take 10 mg by mouth daily.     aspirin 81 MG tablet Take 81 mg by mouth daily.     colchicine 0.6 MG tablet Take 2 tablets at first sign of gout flare then repeat in 2 hours 1 tablet, take 1 tablet a day until resolved (or 10 days maximum). 90 tablet 3   losartan-hydrochlorothiazide (HYZAAR) 100-12.5 MG tablet Take 1 tablet by mouth daily. 90 tablet 3   metFORMIN (GLUCOPHAGE-XR) 500 MG 24 hr tablet Take 500 mg by mouth  2 (two) times daily.     Multiple Vitamin (MULTIVITAMIN) tablet Take 1 tablet by mouth. Once or twice a week     omeprazole (PRILOSEC) 20 MG capsule TAKE 1 CAPSULE BY MOUTH EVERY MORNING. 30 capsule 5   rosuvastatin (CRESTOR) 5 MG tablet Take 5 mg by mouth daily.     sildenafil (REVATIO) 20 MG tablet Take 2-5 tablets as needed once every 48 hours for erectile dysfunction 50 tablet 3   No facility-administered medications prior to visit.     Review of Systems:   Constitutional: No weight loss or gain, night sweats, fevers, chills, fatigue, or lassitude. HEENT: No headaches, difficulty swallowing, tooth/dental problems, or sore throat. No  sneezing, itching, ear ache, nasal congestion, or post nasal drip CV:  No chest pain, orthopnea, PND, swelling in lower extremities, anasarca, dizziness, palpitations, syncope Resp: No shortness of breath with exertion or at rest. No excess mucus or change in color of mucus. No productive or non-productive. No hemoptysis. No wheezing.  No chest wall deformity GI:  No heartburn, indigestion Neuro: No dizziness or lightheadedness.  Psych: No depression or anxiety. Mood stable.     Physical Exam:  BP 138/78 (BP Location: Right Arm, Patient Position: Sitting, Cuff Size: Large)   Pulse 68   Temp 97.8 F (36.6 C) (Oral)   Ht 6\' 1"  (1.854 m)   Wt (!) 325 lb 9.6 oz (147.7 kg)   SpO2 97%   BMI 42.96 kg/m   GEN: Pleasant, interactive, well-appearing; morbidly obese; in no acute distress. HEENT:  Normocephalic and atraumatic. PERRLA. Sclera white. Nasal turbinates pink, moist and patent bilaterally. No rhinorrhea present. Oropharynx pink and moist, without exudate or edema. No lesions, ulcerations, or postnasal drip. Mallampati IV NECK:  Supple w/ fair ROM. No JVD present. Normal carotid impulses w/o bruits. Thyroid symmetrical with no goiter or nodules palpated. No lymphadenopathy.   CV: RRR, no m/r/g, no peripheral edema. Pulses intact, +2 bilaterally. No cyanosis, pallor or clubbing. PULMONARY:  Unlabored, regular breathing. Clear bilaterally A&P w/o wheezes/rales/rhonchi. No accessory muscle use.  GI: BS present and normoactive. Soft, non-tender to palpation. No organomegaly or masses detected.  MSK: No erythema, warmth or tenderness. Cap refil <2 sec all extrem.  Neuro: A/Ox3. No focal deficits noted.   Skin: Warm, no lesions or rashe Psych: Normal affect and behavior. Judgement and thought content appropriate.     Lab Results:  CBC    Component Value Date/Time   WBC 10.1 01/30/2020 1559   RBC 4.39 01/30/2020 1559   HGB 13.8 01/30/2020 1559   HCT 41.5 01/30/2020 1559   PLT 193  01/30/2020 1559   MCV 94.5 01/30/2020 1559   MCH 31.4 01/30/2020 1559   MCHC 33.3 01/30/2020 1559   RDW 13.0 01/30/2020 1559   LYMPHSABS 5,515 (H) 01/30/2020 1559   MONOABS 0.5 07/30/2015 0826   EOSABS 222 01/30/2020 1559   BASOSABS 61 01/30/2020 1559    BMET    Component Value Date/Time   NA 140 01/30/2020 1559   K 4.5 01/30/2020 1559   CL 102 01/30/2020 1559   CO2 28 01/30/2020 1559   GLUCOSE 89 01/30/2020 1559   BUN 15 01/30/2020 1559   CREATININE 0.90 01/30/2020 1559   CALCIUM 9.6 01/30/2020 1559   GFRNONAA 89 01/30/2020 1559   GFRAA 104 01/30/2020 1559    BNP No results found for: "BNP"   Imaging:  No results found.        No data to display  No results found for: "NITRICOXIDE"      Assessment & Plan:   Obstructive sleep apnea Severe OSA, on CPAP. Excellent compliance and control on current settings. He does have moderate leaks but they are not bothersome and do not appear to be affecting his breakthrough events.  Receives benefit from use.  Due for new machine.  Will place orders for new CPAP 13 cmH2O with mask of choice and heated humidity.  Understands proper care/use of device.  Aware of safe driving practices.  Patient Instructions  Continue to use CPAP every night, minimum of 4-6 hours a night.  Change equipment every 30 days or as directed by DME. Wash your tubing with warm soap and water daily, hang to dry. Wash humidifier portion weekly. Use fresh bottled distilled water.  Be aware of reduced alertness and do not drive or operate heavy machinery if experiencing this or drowsiness.  Notify if persistent daytime sleepiness occurs even with consistent use of CPAP.  Orders sent for new CPAP machine   Follow up in 10-12 weeks with Dr. Vassie Loll or Philis Nettle, or sooner, if needed    Morbid obesity (HCC) BMI 42.9. Healthy weight loss encouraged.   I spent 35 minutes of dedicated to the care of this patient on the date of this encounter  to include pre-visit review of records, face-to-face time with the patient discussing conditions above, post visit ordering of testing, clinical documentation with the electronic health record, making appropriate referrals as documented, and communicating necessary findings to members of the patients care team.  Noemi Chapel, NP 10/17/2022  Pt aware and understands NP's role.

## 2022-10-26 ENCOUNTER — Ambulatory Visit (INDEPENDENT_AMBULATORY_CARE_PROVIDER_SITE_OTHER): Payer: Medicare Other | Admitting: Family Medicine

## 2022-10-26 ENCOUNTER — Ambulatory Visit (INDEPENDENT_AMBULATORY_CARE_PROVIDER_SITE_OTHER): Payer: Medicare Other

## 2022-10-26 ENCOUNTER — Other Ambulatory Visit: Payer: Self-pay

## 2022-10-26 VITALS — BP 126/82 | HR 76 | Ht 73.0 in | Wt 324.0 lb

## 2022-10-26 DIAGNOSIS — M25562 Pain in left knee: Secondary | ICD-10-CM | POA: Diagnosis not present

## 2022-10-26 DIAGNOSIS — G8929 Other chronic pain: Secondary | ICD-10-CM | POA: Diagnosis not present

## 2022-10-26 DIAGNOSIS — M1712 Unilateral primary osteoarthritis, left knee: Secondary | ICD-10-CM

## 2022-10-26 NOTE — Patient Instructions (Signed)
Thank you for coming in today.   Please get an Xray today before you leave   You received an injection today. Seek immediate medical attention if the joint becomes red, extremely painful, or is oozing fluid.   Please use Voltaren gel (Generic Diclofenac Gel) up to 4x daily for pain as needed.  This is available over-the-counter as both the name brand Voltaren gel and the generic diclofenac gel.   Check back as needed 

## 2022-10-26 NOTE — Progress Notes (Signed)
Ross Payor, PhD, LAT, ATC acting as a scribe for Ross Graham, MD.  [name]. is a 67 y.o. male who presents to [facility] today for L knee pain. Pt was previously seen by Dr. [name] in 2021 for R foot and R knee pain.  Today, pt c/o L knee pain ongoing since Friday when he was at the mall. He almost fell when going down the escalator. Pt locates pain to the anterior aspect of his knee, distal to the patella, and laterally.  L Knee swelling: no Mechanical symptoms: no Aggravates: stairs,  Treatments tried: IBU, ice  Pertinent review of systems: No fevers or chills  Relevant historical information: Hypertension   Exam:  BP 126/82   Pulse 76   Ht 6\' 1"  (1.854 m)   Wt (!) 324 lb (147 kg)   SpO2 97%   BMI 42.75 kg/m  General: Well Developed, well nourished, and in no acute distress.   MSK: Left knee mild effusion no lumbar pain otherwise. Normal motion with crepitation. Tender palpation medial joint line. Stable ligamentous exam. Intact strength.    Lab and Radiology Results  Procedure: Real-time Ultrasound Guided Injection of left knee joint superior lateral patellar space Device: Philips Affiniti 50G Images permanently stored and available for review in PACS Verbal informed consent obtained.  Discussed risks and benefits of procedure. Warned about infection, bleeding, hyperglycemia damage to structures among others. Patient expresses understanding and agreement Time-out conducted.   Noted no overlying erythema, induration, or other signs of local infection.   Skin prepped in a sterile fashion.   Local anesthesia: Topical Ethyl chloride.   With sterile technique and under real time ultrasound guidance: 40 mg of Kenalog and 2 mL of Marcaine injected into knee joint. Fluid seen entering the joint capsule.   Completed without difficulty   Pain immediately resolved suggesting accurate placement of the medication.   Advised to  call if fevers/chills, erythema, induration, drainage, or persistent bleeding.   Images permanently stored and available for review in the ultrasound unit.  Impression: Technically successful ultrasound guided injection.    X-ray images left knee obtained today personally and independently interpreted Moderate to severe medial compartment DJD.  Mild patellofemoral DJD.  No acute fractures are visible. Await formal radiology review     Assessment and Plan: 67 y.o. male with left knee pain thought to be due to exacerbation of DJD.  Plan for steroid injection today.  Recommend also Voltaren gel.  Next step if not better would be authorization of hyaluronic acid injections and/or Zilretta injections.  He is getting married in about 3 months.  Could repeat this injection right before his wedding if needed.   PDMP not reviewed this encounter. Orders Placed This Encounter  Procedures   [place] LIMITED JOINT SPACE STRUCTURES LOW LEFT(NO LINKED CHARGES)    Order Specific Question:   Reason for Exam (SYMPTOM  OR DIAGNOSIS REQUIRED)    Answer:   left knee pain    Order Specific Question:   Preferred imaging location?    Answer:   [facility] [place] Knee AP/LAT W/Sunrise Left    Standing Status:   Future    Number of Occurrences:   1    Standing Expiration Date:   11/25/2022    Order Specific Question:   Reason for Exam (SYMPTOM  OR DIAGNOSIS REQUIRED)    Answer:   left knee pain    Order Specific Question:  Preferred imaging location?    Answer:   [facility]   No orders of the defined types were placed in this encounter.    Discussed warning signs or symptoms. Please see discharge instructions. Patient expresses understanding.   The above documentation has been reviewed and is accurate and complete Lynne Leader, M.D.

## 2022-10-27 DIAGNOSIS — M1712 Unilateral primary osteoarthritis, left knee: Secondary | ICD-10-CM | POA: Insufficient documentation

## 2022-11-03 NOTE — Progress Notes (Signed)
Left knee x-ray shows arthritis changes.

## 2022-11-22 DIAGNOSIS — D369 Benign neoplasm, unspecified site: Secondary | ICD-10-CM | POA: Insufficient documentation

## 2022-12-30 ENCOUNTER — Encounter: Payer: Self-pay | Admitting: Pulmonary Disease

## 2022-12-30 ENCOUNTER — Ambulatory Visit: Payer: Medicare Other | Admitting: Pulmonary Disease

## 2022-12-30 VITALS — BP 124/70 | HR 66 | Ht 73.0 in | Wt 321.0 lb

## 2022-12-30 DIAGNOSIS — G4733 Obstructive sleep apnea (adult) (pediatric): Secondary | ICD-10-CM | POA: Diagnosis not present

## 2022-12-30 NOTE — Assessment & Plan Note (Signed)
CPAP download was reviewed which shows excellent control of events on 13 cm with good compliance more than 7 hours per night.  There is mild leak.  CPAP is certainly helped improve his daytime somnolence and fatigue.  CPAP supplies will be renewed as needed Weight loss encouraged, compliance with goal of at least 4-6 hrs every night is the expectation. Advised against medications with sedative side effects Cautioned against driving when sleepy - understanding that sleepiness will vary on a day to day basis

## 2022-12-30 NOTE — Patient Instructions (Signed)
CPAP is working well Renew supplies x  1year

## 2022-12-30 NOTE — Progress Notes (Signed)
   Subjective:    Patient ID: Ross [name]., male    DOB: Oct 10, 1955, 67 y.o.   MRN: [id number]  HPI  67 yo for FU of  severe OSA -On CPAP 13 cm full facemask   His old machine ' reached the end of its motor life" & we provided him with a new CPAP He is still using his old machine because he has supplies left. He arrives with his fiance, they are planning to get married in September. He denies any problems with mask or pressure.  My partner does not hear any snoring or mask leak.  She sleeps with her own CPAP machine He will occasionally sleep in the recliner and wake up choking and gasping     Significant tests/ events reviewed   PSG 2011 AHI 19/hr  Home sleep study 1/ 2017 with AHI of 42.4/hr , low sat 61% 08/2015  CPAP >> 13 cm of H2O  Review of Systems neg for any significant sore throat, dysphagia, itching, sneezing, nasal congestion or excess/ purulent secretions, fever, chills, sweats, unintended wt loss, pleuritic or exertional cp, hempoptysis, orthopnea pnd or change in chronic leg swelling. Also denies presyncope, palpitations, heartburn, abdominal pain, nausea, vomiting, diarrhea or change in bowel or urinary habits, dysuria,hematuria, rash, arthralgias, visual complaints, headache, numbness weakness or ataxia.      Objective:   Physical Exam  Gen. Pleasant, obese, in no distress ENT - no lesions, no post nasal drip Neck: No JVD, no thyromegaly, no carotid bruits Lungs: no use of accessory muscles, no dullness to percussion, decreased without rales or rhonchi  Cardiovascular: Rhythm regular, heart sounds  normal, no murmurs or gallops, no peripheral edema Musculoskeletal: No deformities, no cyanosis or clubbing , no tremors       Assessment & Plan:

## 2023-05-18 ENCOUNTER — Encounter: Payer: Self-pay | Admitting: Cardiology

## 2023-06-12 ENCOUNTER — Encounter: Payer: Self-pay | Admitting: Physician Assistant

## 2023-07-21 ENCOUNTER — Encounter: Payer: Self-pay | Admitting: Physician Assistant

## 2023-07-21 ENCOUNTER — Ambulatory Visit: Payer: Medicare Other | Admitting: Physician Assistant

## 2023-07-21 VITALS — BP 130/84 | HR 85 | Ht 72.0 in | Wt 327.6 lb

## 2023-07-21 DIAGNOSIS — R7401 Elevation of levels of liver transaminase levels: Secondary | ICD-10-CM

## 2023-07-21 NOTE — Progress Notes (Signed)
 Chief Complaint: Elevated ALT  HPI:    [name]. is a 68 year old male, previously known to Dr. Russella Dar, with a past medical history as listed below including CLL, who was referred to me by Donetta Potts, MD for a complaint of elevated ALT.    11/04/2022 colonoscopy with [facility] showed 6 polyps.  Pathology showed 5 adenomas and 1 benign polyp.  Repeat recommended in 3 years.    11/14/2014 CTAP with contrast showed no fatty liver.    06/15/2023 CBC with a white count of 12.2 and otherwise normal.  CMP with an elevated glucose of 197 and otherwise normal.  ALT 46.  (Fib 4 score based off these numbers and is 1.42 with an approximate fibrosis stage 0-I)    Today, the patient presents to clinic accompanied by his new wife of the past 5 to 6 months.  She does assist with history.  Explains that he has been told on recent labs that his ALT is elevated and that he likely has fatty liver.  He is here to discuss this further.  Denies any family history of liver disease.  He does have diabetes, high cholesterol and obesity.    Also describes some chronic rectal bleeding with fissures and hemorrhoids.    Briefly discusses that he has recently been diagnosed with CLL and is currently undergoing further workup for this.    Denies fever, chills or weight loss.  Past Medical History:  Diagnosis Date   Anal fissure    Arthritis    DDD (degenerative disc disease)    GERD (gastroesophageal reflux disease)    Glaucoma    Gout    Hemorrhoids    Hypertension    OSA on CPAP    Rectal bleeding    Type 2 diabetes mellitus (HCC)     Past Surgical History:  Procedure Laterality Date   EYE SURGERY Right    laser    GLAUCOMA SURGERY Bilateral    LUMBAR DISC SURGERY     L5   PYLOROPLASTY     pyloric stenosis, as infant   TONSILLECTOMY      Current Outpatient Medications  Medication Sig Dispense Refill   allopurinol (ZYLOPRIM) 300 MG tablet Take 1 tablet (300 mg total) by mouth daily. 90 tablet 3    amLODipine (NORVASC) 10 MG tablet Take 10 mg by mouth daily.     aspirin 81 MG tablet Take 81 mg by mouth daily.     colchicine 0.6 MG tablet Take 2 tablets at first sign of gout flare then repeat in 2 hours 1 tablet, take 1 tablet a day until resolved (or 10 days maximum). 90 tablet 3   losartan-hydrochlorothiazide (HYZAAR) 100-12.5 MG tablet Take 1 tablet by mouth daily. 90 tablet 3   metFORMIN (GLUCOPHAGE-XR) 500 MG 24 hr tablet Take 500 mg by mouth 2 (two) times daily. (Patient not taking: Reported on 12/30/2022)     Multiple Vitamin (MULTIVITAMIN) tablet Take 1 tablet by mouth. Once or twice a week     omeprazole (PRILOSEC) 20 MG capsule TAKE 1 CAPSULE BY MOUTH EVERY MORNING. 30 capsule 5   rosuvastatin (CRESTOR) 5 MG tablet Take 5 mg by mouth daily.     sildenafil (REVATIO) 20 MG tablet Take 2-5 tablets as needed once every 48 hours for erectile dysfunction 50 tablet 3   No current facility-administered medications for this visit.    Allergies as of 07/21/2023   (No Known Allergies)    Family History  Problem Relation Age of Onset   Colon cancer Mother    Breast cancer Mother    Prostate cancer Father    Testicular cancer Father    Stomach cancer Paternal Grandmother    Diabetes Maternal Grandmother    Heart disease Maternal Grandmother    Heart disease Maternal Grandfather    Other Son        "stomach aneurysm"    Social History   Socioeconomic History   Marital status: Married    Spouse name: Isabelle Course   Number of children: 3   Years of education: Not on file   Highest education level: Not on file  Occupational History   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  Tobacco Use   Smoking status: Former    Current packs/day: 0.00    Average packs/day: 1 pack/day for 20.0 years (20.0 ttl pk-yrs)    Types: Cigarettes    Start date: 05/17/1971    Quit date: 05/17/1991    Years since quitting: 32.2    Passive exposure: Never   Smokeless tobacco: Never  Substance  and Sexual Activity   Alcohol use: Yes    Alcohol/week: 2.0 standard drinks of alcohol    Types: 2 Standard drinks or equivalent per week    Comment: Social use    Drug use: No   Sexual activity: Yes  Other Topics Concern   Not on file  Social History Narrative   Married- lost wife Isabelle Course Dec 2017n liver transplant HCV- he was tested and negative. 2 children prior marriage.       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       Hobbies: out to eat, shopping   Volleyball, cycling, basketball in past.    Family history researc   Social Drivers of Health   Financial Resource Strain: Low Risk  (03/30/2023)   Received from [facility]   Overall Financial Resource Strain (CARDIA)    Difficulty of Paying Living Expenses: Not hard at all  Food Insecurity: No Food Insecurity (03/30/2023)   Received from [facility]   Hunger Vital Sign    Worried About Running Out of Food in the Last Year: Never true    Ran Out of Food in the Last Year: Never true  Transportation Needs: No Transportation Needs (03/30/2023)   Received from [facility] - Transportation    Lack of Transportation (Medical): No    Lack of Transportation (Non-Medical): No  Physical Activity: Inactive (03/30/2023)   Received from [facility]   Exercise Vital Sign    Days of Exercise per Week: 0 days    Minutes of Exercise per Session: 0 min  Stress: No Stress Concern Present (03/30/2023)   Received from [facility] - Occupational Stress Questionnaire    Feeling of Stress : Not at all  Social Connections: Moderately Isolated (03/30/2023)   Received from [facility]   Social Connection and Isolation Panel [NHANES]    Frequency of Communication with Friends and Family: More than three times a week    Frequency of Social Gatherings with Friends and Family: More than three times a week    Attends Religious Services: Never    Database administrator  or Organizations: No    Attends Banker Meetings: Never    Marital Status: Married  Catering manager Violence: Not At Risk (03/30/2023)   Received from [facility]   Humiliation, Afraid, Rape, and Kick questionnaire    Fear of Current or Ex-Partner: No    Emotionally Abused: No    Physically Abused: No    Sexually Abused: No    Review of Systems:    Constitutional: No weight loss, fever or chills +fatigue Skin: No rash  Cardiovascular: No chest pain, chest pressure or palpitations   Respiratory: No SOB or cough Gastrointestinal: See HPI and otherwise negative Genitourinary: No dysuria or change in urinary frequency Neurological: No headache, dizziness or syncope Musculoskeletal: No new muscle or joint pain Hematologic: No bruising Psychiatric: No history of depression or anxiety   Physical Exam:  Vital signs: BP 130/84 (BP Location: Right Arm, Patient Position: Sitting, Cuff Size: Normal) Comment (BP Location): forearm  Pulse 85   Ht 6' (1.829 m)   Wt (!) 327 lb 9.6 oz (148.6 kg)   BMI 44.43 kg/m    Constitutional:   Pleasant obese Caucasian male appears to be in NAD, Well developed, Well nourished, alert and cooperative Head:  Normocephalic and atraumatic. Eyes:   PEERL, EOMI. No icterus. Conjunctiva pink. Ears:  Normal auditory acuity. Neck:  Supple Throat: Oral cavity and pharynx without inflammation, swelling or lesion.  Respiratory: Respirations even and unlabored. Lungs clear to auscultation bilaterally.   No wheezes, crackles, or rhonchi.  Cardiovascular: Normal S1, S2. No MRG. Regular rate and rhythm. No peripheral edema, cyanosis or pallor.  Gastrointestinal:  Soft, nondistended, nontender. No rebound or guarding. Normal bowel sounds. No appreciable masses or hepatomegaly. Rectal:  Not performed.  Msk:  Symmetrical without gross deformities. Without edema, no deformity or joint abnormality.  Neurologic:  Alert and  oriented x4;  grossly normal  neurologically.  Skin:   Dry and intact without significant lesions or rashes. Psychiatric: Demonstrates good judgement and reason without abnormal affect or behaviors.  RELEVANT LABS AND IMAGING: CBC    Component Value Date/Time   WBC 10.1 01/30/2020 1559   RBC 4.39 01/30/2020 1559   HGB 13.8 01/30/2020 1559   HCT 41.5 01/30/2020 1559   PLT 193 01/30/2020 1559   MCV 94.5 01/30/2020 1559   MCH 31.4 01/30/2020 1559   MCHC 33.3 01/30/2020 1559   RDW 13.0 01/30/2020 1559   LYMPHSABS 5,515 (H) 01/30/2020 1559   MONOABS 0.5 07/30/2015 0826   EOSABS 222 01/30/2020 1559   BASOSABS 61 01/30/2020 1559    CMP     Component Value Date/Time   NA 140 01/30/2020 1559   K 4.5 01/30/2020 1559   CL 102 01/30/2020 1559   CO2 28 01/30/2020 1559   GLUCOSE 89 01/30/2020 1559   BUN 15 01/30/2020 1559   CREATININE 0.90 01/30/2020 1559   CALCIUM 9.6 01/30/2020 1559   PROT 7.0 01/30/2020 1559   ALBUMIN 4.7 11/10/2017 0851   AST 30 01/30/2020 1559   ALT 25 01/30/2020 1559   ALKPHOS 64 11/10/2017 0851   BILITOT 0.9 01/30/2020 1559   GFRNONAA 89 01/30/2020 1559   GFRAA 104 01/30/2020 1559    Assessment: 1.  Elevated ALT: Patient does have metabolic disorder and likely this is fatty liver, but has not had imaging of his liver lately, will order ultrasound with elastography for further eval, fib 4 score 0-1 today based off of most recent labs  Plan: 1.  Ordered ultrasound of the elastography. 2.  Did discuss the possible diagnosis of fatty liver and recommendations for slow and steady weight loss of 1 to 2 pounds per week.  Discussed a  healthier diet. 3.  Reviewed last colonoscopy, patient is due for repeat in 2027 given history of adenomatous polyps. 4.  Patient assigned to Dr. Chales Abrahams today.  He will follow in clinic per recommendations after imaging above.  Hyacinth Meeker, PA-C [facility] 07/21/2023, 11:10 AM  Cc: Donetta Potts, MD

## 2023-07-21 NOTE — Patient Instructions (Signed)
 You have been scheduled for an abdominal ultrasound at [facility] (1st floor of hospital) on Monday 07/24/23 at 10 am. Please arrive 30 minutes prior to your appointment for registration. Make certain not to have anything to eat or drink 6 hours prior to your appointment. Should you need to reschedule your appointment, please contact radiology at [phone]. This test typically takes about 30 minutes to perform.  _______________________________________________________  If your blood pressure at your visit was 140/90 or greater, please contact your primary care physician to follow up on this.  _______________________________________________________  If you are age 70 or older, your body mass index should be between 23-30. Your Body mass index is 44.43 kg/m. If this is out of the aforementioned range listed, please consider follow up with your Primary Care Provider.  If you are age 64 or younger, your body mass index should be between 19-25. Your Body mass index is 44.43 kg/m. If this is out of the aformentioned range listed, please consider follow up with your Primary Care Provider.   ________________________________________________________  The [facility] providers would like to encourage you to use [facility] to communicate with providers for non-urgent requests or questions.  Due to long hold times on the telephone, sending your provider a message by [facility] may be a faster and more efficient way to get a response.  Please allow 48 business hours for a response.  Please remember that this is for non-urgent requests.  _______________________________________________________

## 2023-07-26 ENCOUNTER — Ambulatory Visit (HOSPITAL_COMMUNITY)

## 2023-07-28 ENCOUNTER — Ambulatory Visit (HOSPITAL_COMMUNITY)
Admission: RE | Admit: 2023-07-28 | Discharge: 2023-07-28 | Disposition: A | Discharge status: Home / Self Care | Source: Ambulatory Visit | Attending: Physician Assistant | Admitting: Physician Assistant

## 2023-07-28 DIAGNOSIS — R7401 Elevation of levels of liver transaminase levels: Secondary | ICD-10-CM | POA: Diagnosis present

## 2023-08-16 ENCOUNTER — Other Ambulatory Visit: Payer: Self-pay | Admitting: *Deleted

## 2023-08-16 DIAGNOSIS — R7401 Elevation of levels of liver transaminase levels: Secondary | ICD-10-CM

## 2023-09-06 ENCOUNTER — Ambulatory Visit: Payer: Medicare Other | Admitting: Cardiology

## 2023-10-26 ENCOUNTER — Telehealth: Payer: Self-pay | Admitting: *Deleted

## 2023-10-26 DIAGNOSIS — G4733 Obstructive sleep apnea (adult) (pediatric): Secondary | ICD-10-CM

## 2023-10-26 NOTE — Telephone Encounter (Signed)
 Copied from CRM [id number]. Topic: Clinical - Order For Equipment >> Oct 25, 2023  4:47 PM Eveleen Hinds B wrote: Reason for CRM: Patient would like new supplies (Face mask/cushion) and filter. He gets supplies from [place] and states they told him it's time for a new order.

## 2023-10-26 NOTE — Telephone Encounter (Signed)
 I have placed referral for CPAP supplies  Called the pt and there was no answer- LMTCB.

## 2023-10-27 NOTE — Telephone Encounter (Signed)
 Pt called back and given message his cpap supply referral was placed.  Pt also made follow up appt for 12/23/23.

## 2023-11-21 ENCOUNTER — Telehealth: Payer: Self-pay | Admitting: Pulmonary Disease

## 2023-11-21 NOTE — Telephone Encounter (Signed)
 CMN for supplies sent to [facility] this morning confirmation received

## 2023-11-21 NOTE — Telephone Encounter (Signed)
 Copied from CRM [id number]. Topic: Clinical - Order For Equipment >> Nov 20, 2023  9:01 AM Corean SAUNDERS wrote: Reason for CRM: Please order a renewal of CPAP supplies to [facility] in [place].

## 2023-12-04 ENCOUNTER — Ambulatory Visit: Admitting: Cardiology

## 2023-12-21 ENCOUNTER — Encounter (HOSPITAL_BASED_OUTPATIENT_CLINIC_OR_DEPARTMENT_OTHER): Payer: Self-pay | Admitting: Nurse Practitioner

## 2023-12-21 ENCOUNTER — Ambulatory Visit (HOSPITAL_BASED_OUTPATIENT_CLINIC_OR_DEPARTMENT_OTHER): Admitting: Nurse Practitioner

## 2023-12-21 VITALS — BP 126/82 | HR 73 | Ht 72.0 in | Wt 308.0 lb

## 2023-12-21 DIAGNOSIS — I10 Essential (primary) hypertension: Secondary | ICD-10-CM

## 2023-12-21 DIAGNOSIS — Z6841 Body Mass Index (BMI) 40.0 and over, adult: Secondary | ICD-10-CM

## 2023-12-21 DIAGNOSIS — C9110 Chronic lymphocytic leukemia of B-cell type not having achieved remission: Secondary | ICD-10-CM | POA: Insufficient documentation

## 2023-12-21 DIAGNOSIS — Z87891 Personal history of nicotine dependence: Secondary | ICD-10-CM

## 2023-12-21 DIAGNOSIS — G4733 Obstructive sleep apnea (adult) (pediatric): Secondary | ICD-10-CM

## 2023-12-21 NOTE — Assessment & Plan Note (Signed)
 Severe OSA, on CPAP. Excellent compliance and control on current settings. He does have moderate/large leaks but they are not bothersome and do not appear to be affecting his residual AHI.  Receives benefit from use. Understands proper care/use of device.  Aware of safe driving practices.  Patient Instructions  Continue to use CPAP every night, minimum of 4-6 hours a night.  Change equipment as directed. Wash your tubing with warm soap and water daily, hang to dry. Wash humidifier portion weekly. Use bottled, distilled water and change daily Be aware of reduced alertness and do not drive or operate heavy machinery if experiencing this or drowsiness.  Exercise encouraged, as tolerated. Healthy weight management discussed.  Avoid or decrease alcohol consumption and medications that make you more sleepy, if possible. Notify if persistent daytime sleepiness occurs even with consistent use of PAP therapy.  Change CPAP supplies... Every month Mask cushions and/or nasal pillows CPAP machine filters Every 3 months Mask frame (not including the headgear) CPAP tubing Every 6 months Mask headgear Chin strap (if applicable) Humidifier water tub  Follow up in one year with Katie Cedar Ditullio,NP, or sooner, if needed

## 2023-12-21 NOTE — Assessment & Plan Note (Signed)
 Under surveillance. Follow up with oncology as scheduled

## 2023-12-21 NOTE — Progress Notes (Signed)
 @Patient  ID: Ross [name]., male    DOB: [birth year], 68 y.o.   MRN: [id number]  Chief Complaint  Patient presents with   Follow-up    Obstructive sleep apnea    Referring provider: Trudy Vaughn FALCON, MD  HPI: 68 year old male, former smoker followed for OSA on CPAP. He is a patient of Dr. Cyndi and last seen in office 12/30/2023. Past medical history significant for HTN, OSA, GERD, obesity, gout, ED, BPH.   TEST/EVENTS: 2011 PSG: AHI 19/h 05/2015 HST: AHI 42.4/h, SpO2 low 61% 08/2015 CPAP titration >> 13 cmH2O  02/28/2022: OV with Dr. Jude. Annual visit follow up. Settled down with fullface mask. No problems with pressure. Feels rested. HTN well controlled. CPAP supplies renewed x 1 year. Excellent control on download.   10/17/2022: OV with Laney Bagshaw NP for follow up. He has been doing well with his CPAP since he was here last but he started getting a motor life warning. The machine is currently still working but he wants to go ahead and get a new one because he's worried that he won't be able to sleep if something happens to his CPAP. He sleeps with it nightly. Doesn't think he could sleep without it. Wakes feeling rested in the mornings. He doesn't notice any bothersome leaks. Doesn't have any trouble with excess daytime sleepiness or drowsy driving. He's getting married in September, which he is very excited about.  09/17/2022-10/16/2022: CPAP 13 cmH2O 30/30 days; 100% >4 hr; average use 7 hr 36 min Leaks 95th 65.1 AHI 1.3  12/21/2023: Today - follow up Discussed the use of AI scribe software for clinical note transcription with the patient, who gave verbal consent to proceed.  History of Present Illness Ross [name]. is a 68 year old male with severe sleep apnea who presents for a routine follow-up.  He has chronic lymphocytic leukemia (CLL), diagnosed in November of last year, with symptoms believed to have started in the spring of 2024. He experiences significant fatigue, which  he manages with vitamins. His blood work has been stable, allowing for less frequent monitoring every four months. He is currently under observation without active treatment for CLL.  He has severe sleep apnea and uses a CPAP machine with a full face mask. He reports some occasional issues with mask leaks but states that they do not disturb his sleep. He wakes up primarily due to the diuretic he takes, which causes nocturia. No issues with drowsy driving or morning headaches.   He started Ozempic six weeks ago and experiences some constipation but otherwise doing well.   09/19/2023-12/17/2023 CPAP 13 cmH2O 90/90 days; 100% >4 hr; average use 7 hr 43 min Leaks 95th 76 AHI 1    No Known Allergies  Immunization History  Administered Date(s) Administered   Fluad Quad(high Dose 65+) 03/25/2019, 01/30/2020, 02/24/2021   Influenza Split 02/13/2013   Influenza,inj,Quad PF,6+ Mos 02/08/2016, 01/26/2017   Influenza-Unspecified 02/13/2014, 03/14/2018   Moderna Sars-Covid-2 Vaccination 06/06/2019, 07/12/2019, 05/07/2020   Pneumococcal Conjugate-13 03/25/2019   Td 01/25/2007   Tdap 05/26/2017   Zoster, Live 03/21/2016    Past Medical History:  Diagnosis Date   Anal fissure    Arthritis    CLL (chronic lymphocytic leukemia) (HCC)    DDD (degenerative disc disease)    GERD (gastroesophageal reflux disease)    Glaucoma    Gout    Hemorrhoids    Hypertension    OSA on CPAP    Rectal bleeding  Type 2 diabetes mellitus (HCC)     Tobacco History: Social History   Tobacco Use  Smoking Status Former   Current packs/day: 0.00   Average packs/day: 1 pack/day for 20.0 years (20.0 ttl pk-yrs)   Types: Cigarettes   Start date: 05/17/1971   Quit date: 05/17/1991   Years since quitting: 32.6   Passive exposure: Never  Smokeless Tobacco Never   Counseling given: Not Answered   Outpatient Medications Prior to Visit  Medication Sig Dispense Refill   allopurinol  (ZYLOPRIM ) 300 MG tablet Take  1 tablet (300 mg total) by mouth daily. 90 tablet 3   amLODipine (NORVASC) 10 MG tablet Take 10 mg by mouth daily.     aspirin  81 MG tablet Take 81 mg by mouth daily.     colchicine  0.6 MG tablet Take 2 tablets at first sign of gout flare then repeat in 2 hours 1 tablet, take 1 tablet a day until resolved (or 10 days maximum). 90 tablet 3   losartan -hydrochlorothiazide  (HYZAAR) 100-25 MG tablet Take 1 tablet by mouth daily.     metFORMIN (GLUCOPHAGE-XR) 500 MG 24 hr tablet Take 500 mg by mouth 2 (two) times daily.     Multiple Vitamin (MULTIVITAMIN) tablet Take 1 tablet by mouth. Once or twice a week     omeprazole  (PRILOSEC) 20 MG capsule TAKE 1 CAPSULE BY MOUTH EVERY MORNING. 30 capsule 5   rosuvastatin (CRESTOR) 5 MG tablet Take 5 mg by mouth daily.     Semaglutide (OZEMPIC, 0.25 OR 0.5 MG/DOSE, [place]) Inject 0.5 mg into the skin once a week.     sildenafil  (REVATIO ) 20 MG tablet Take 2-5 tablets as needed once every 48 hours for erectile dysfunction 50 tablet 3   losartan -hydrochlorothiazide  (HYZAAR) 100-12.5 MG tablet Take 1 tablet by mouth daily. (Patient not taking: Reported on 12/21/2023) 90 tablet 3   No facility-administered medications prior to visit.     Review of Systems:   Constitutional: No weight loss or gain, night sweats, fevers, chills, or lassitude. +chronicfatigue  HEENT: No headaches CV:  No chest pain, orthopnea, PND,  palpitations Resp: +snoring (without CPAP) GI:  No heartburn, indigestion Neuro: No dizziness or lightheadedness.  Psych: No depression or anxiety. Mood stable.     Physical Exam:  BP 126/82   Pulse 73   Ht 6' (1.829 m)   Wt (!) 308 lb (139.7 kg)   SpO2 97%   BMI 41.77 kg/m   GEN: Pleasant, interactive, well-appearing; morbidly obese; in no acute distress. HEENT:  Normocephalic and atraumatic. PERRLA. Sclera white. Nasal turbinates pink, moist and patent bilaterally. No rhinorrhea present. Oropharynx pink and moist, without exudate or edema. No  lesions, ulcerations, or postnasal drip. Mallampati IV NECK:  Supple w/ fair ROM. No lymphadenopathy.   CV: RRR, no m/r/g, no peripheral edema. Pulses intact, +2 bilaterally. No cyanosis, pallor or clubbing. PULMONARY:  Unlabored, regular breathing. Clear bilaterally A&P w/o wheezes/rales/rhonchi. No accessory muscle use.  GI: BS present and normoactive. Soft, non-tender to palpation. No organomegaly or masses detected.  MSK: No erythema, warmth or tenderness. Cap refil <2 sec all extrem.  Neuro: A/Ox3. No focal deficits noted.   Skin: Warm, no lesions or rashe Psych: Normal affect and behavior. Judgement and thought content appropriate.     Lab Results:  CBC    Component Value Date/Time   WBC 10.1 01/30/2020 1559   RBC 4.39 01/30/2020 1559   HGB 13.8 01/30/2020 1559   HCT 41.5 01/30/2020 1559  PLT 193 01/30/2020 1559   MCV 94.5 01/30/2020 1559   MCH 31.4 01/30/2020 1559   MCHC 33.3 01/30/2020 1559   RDW 13.0 01/30/2020 1559   LYMPHSABS 5,515 (H) 01/30/2020 1559   MONOABS 0.5 07/30/2015 0826   EOSABS 222 01/30/2020 1559   BASOSABS 61 01/30/2020 1559    BMET    Component Value Date/Time   NA 140 01/30/2020 1559   K 4.5 01/30/2020 1559   CL 102 01/30/2020 1559   CO2 28 01/30/2020 1559   GLUCOSE 89 01/30/2020 1559   BUN 15 01/30/2020 1559   CREATININE 0.90 01/30/2020 1559   CALCIUM 9.6 01/30/2020 1559   GFRNONAA 89 01/30/2020 1559   GFRAA 104 01/30/2020 1559    BNP No results found for: BNP   Imaging:  No results found.  Administration History     None           No data to display          No results found for: NITRICOXIDE      Assessment & Plan:   Obstructive sleep apnea Severe OSA, on CPAP. Excellent compliance and control on current settings. He does have moderate/large leaks but they are not bothersome and do not appear to be affecting his residual AHI.  Receives benefit from use. Understands proper care/use of device.  Aware of safe  driving practices.  Patient Instructions  Continue to use CPAP every night, minimum of 4-6 hours a night.  Change equipment as directed. Wash your tubing with warm soap and water daily, hang to dry. Wash humidifier portion weekly. Use bottled, distilled water and change daily Be aware of reduced alertness and do not drive or operate heavy machinery if experiencing this or drowsiness.  Exercise encouraged, as tolerated. Healthy weight management discussed.  Avoid or decrease alcohol consumption and medications that make you more sleepy, if possible. Notify if persistent daytime sleepiness occurs even with consistent use of PAP therapy.  Change CPAP supplies... Every month Mask cushions and/or nasal pillows CPAP machine filters Every 3 months Mask frame (not including the headgear) CPAP tubing Every 6 months Mask headgear Chin strap (if applicable) Humidifier water tub  Follow up in one year with Katie Lyza Houseworth,NP, or sooner, if needed    Morbid obesity (HCC) BMI 41. Continued healthy weight loss encouraged. Advised to adjust fiber intake to help with constipation   CLL (chronic lymphocytic leukemia) (HCC) Under surveillance. Follow up with oncology as scheduled   I spent 25 minutes of dedicated to the care of this patient on the date of this encounter to include pre-visit review of records, face-to-face time with the patient discussing conditions above, post visit ordering of testing, clinical documentation with the electronic health record, making appropriate referrals as documented, and communicating necessary findings to members of the patients care team.  Comer LULLA Rouleau, NP 12/21/2023  Pt aware and understands NP's role.

## 2023-12-21 NOTE — Assessment & Plan Note (Addendum)
 BMI 41. Continued healthy weight loss encouraged. Advised to adjust fiber intake to help with constipation

## 2023-12-21 NOTE — Patient Instructions (Signed)
 Continue to use CPAP every night, minimum of 4-6 hours a night.  Change equipment as directed. Wash your tubing with warm soap and water daily, hang to dry. Wash humidifier portion weekly. Use bottled, distilled water and change daily Be aware of reduced alertness and do not drive or operate heavy machinery if experiencing this or drowsiness.  Exercise encouraged, as tolerated. Healthy weight management discussed.  Avoid or decrease alcohol consumption and medications that make you more sleepy, if possible. Notify if persistent daytime sleepiness occurs even with consistent use of PAP therapy.  Change CPAP supplies... Every month Mask cushions and/or nasal pillows CPAP machine filters Every 3 months Mask frame (not including the headgear) CPAP tubing Every 6 months Mask headgear Chin strap (if applicable) Humidifier water tub  Follow up in one year with Katie Jonte Wollam,NP, or sooner, if needed

## 2024-03-13 ENCOUNTER — Encounter: Payer: Self-pay | Admitting: Cardiology

## 2024-03-13 ENCOUNTER — Encounter: Payer: Self-pay | Admitting: *Deleted

## 2024-03-13 ENCOUNTER — Ambulatory Visit: Attending: Cardiology | Admitting: Cardiology

## 2024-03-13 VITALS — BP 118/74 | HR 77 | Ht 73.0 in | Wt 300.8 lb

## 2024-03-13 DIAGNOSIS — I10 Essential (primary) hypertension: Secondary | ICD-10-CM | POA: Insufficient documentation

## 2024-03-13 DIAGNOSIS — E118 Type 2 diabetes mellitus with unspecified complications: Secondary | ICD-10-CM | POA: Diagnosis present

## 2024-03-13 DIAGNOSIS — C9110 Chronic lymphocytic leukemia of B-cell type not having achieved remission: Secondary | ICD-10-CM | POA: Insufficient documentation

## 2024-03-13 NOTE — Progress Notes (Signed)
    Cardiology Office Note  Date: 03/13/2024   ID: Kemon Devincenzi., DOB 04-Jan-1956, MRN [id number]  History of Present Illness: Ross [name]. is a 68 y.o. male last seen in October 2023.  He is here today with his wife for a follow-up visit.  He does not report any chest discomfort with activity, no increasing dyspnea on exertion.  He has been having some arm paresthesias associated with neck pain on movement and a prior history of cervical disc disease.  He has been referred for MRI and surgical consultation by Dr. Trudy.  He has lost a significant amount of weight, approximately 30 pounds since being on Ozempic for concurrent treatment of type 2 diabetes mellitus.  He is no longer taking metformin and also stopped Crestor.  We are requesting his most recent lipid panel.  He is also following with oncology for surveillance of CLL.  I reviewed his ECG today which shows normal sinus rhythm.  Physical Exam: VS:  BP 118/74   Pulse 77   Ht 6' 1 (1.854 m)   Wt (!) 300 lb 12.8 oz (136.4 kg)   SpO2 91%   BMI 39.69 kg/m , BMI Body mass index is 39.69 kg/m.  Wt Readings from Last 3 Encounters:  03/13/24 (!) 300 lb 12.8 oz (136.4 kg)  12/21/23 (!) 308 lb (139.7 kg)  07/21/23 (!) 327 lb 9.6 oz (148.6 kg)    General: Patient appears comfortable at rest. HEENT: Conjunctiva and lids normal Neck: Supple, no elevated JVP or carotid bruits. Lungs: Clear to auscultation, nonlabored breathing at rest. Cardiac: Regular rate and rhythm, no S3 or significant systolic murmur.  ECG:  An ECG dated 02/28/2022 was personally reviewed today and demonstrated:  Sinus rhythm.  Labwork:  January 2025: BUN 16, creatinine 0.98, GFR 83, potassium 3.8, AST 29, ALT 46  Other Studies Reviewed Today:  No interval cardiac testing for review today.  Assessment and Plan:  1.  History of atypical chest pain without recurrence.  Exercise Myoview in 2022 was low risk, no evidence of ischemia and LVEF  65%.  We have continued with observation and recommended risk factor reduction.  His ECG is normal today.  Now on Ozempic for concurrent treatment of type 2 diabetes mellitus per PCP, he has lost about 30 pounds.  I talked with him about a walking plan for exercise.  He is also on aspirin  81 mg daily.  We will request his most recent lipid panel from PCP, still worth considering at least low-dose statin therapy given cardiovascular risk equivalent with type 2 diabetes mellitus.   2.  Primary hypertension.  Blood pressure is well-controlled today.  He is on Hyzaar 100/1225 mg daily and Norvasc 10 mg daily.   3.  OSA on CPAP.  He reports compliance.  Disposition:  Follow up 1 year.  Signed, Jayson JUDITHANN Sierras, M.D., F.A.C.C. [facility]

## 2024-03-13 NOTE — Patient Instructions (Addendum)

## 2024-04-25 ENCOUNTER — Other Ambulatory Visit: Payer: Self-pay | Admitting: Neurological Surgery

## 2024-04-26 NOTE — Progress Notes (Signed)
 Surgical Instructions   Your procedure is scheduled on Tuesday, December 16th. Report to [facility] Main Entrance A at 12:00 A.M., then check in with the Admitting office. Any questions or running late day of surgery: call [phone]  Questions prior to your surgery date: call [phone], Monday-Friday, 8am-4pm. If you experience any cold or flu symptoms such as cough, fever, chills, shortness of breath, etc. between now and your scheduled surgery, please notify us  at the above number.     Remember:  Do not eat after midnight the night before your surgery. This includes no gum, mints, or hard candy.   Take these medicines the morning of surgery with A SIP OF WATER  allopurinol  (ZYLOPRIM )  amLODipine (NORVASC)  omeprazole  (PRILOSEC)   May take these medicines IF NEEDED: NONE   Follow your surgeon's instructions on when to stop Asprin.  If no instructions were given by your surgeon then you will need to call the office to get those instructions.     One week prior to surgery, STOP taking any Aleve, Naproxen, Motrin, Goody's, BC's, all herbal medications, fish oil, and non-prescription vitamins. This includes your ibuprofen (ADVIL).                      Do NOT Smoke (Tobacco/Vaping) for 24 hours prior to your procedure.  If you use a CPAP at night, you may bring your mask/headgear for your overnight stay.   You will be asked to remove any contacts, glasses, piercing's, hearing aid's, dentures/partials prior to surgery. Please bring cases for these items if needed.    Patients discharged the day of surgery will not be allowed to drive home, and someone needs to stay with them for 24 hours.  SURGICAL WAITING ROOM VISITATION Patients may have no more than 2 support people in the waiting area - these visitors may rotate.   Pre-op nurse will coordinate an appropriate time for 1 ADULT support person, who may not rotate, to accompany patient in pre-op.  Children under the age of 50  must have an adult with them who is not the patient and must remain in the main waiting area with an adult.  If the patient needs to stay at the hospital during part of their recovery, the visitor guidelines for inpatient rooms apply.  Please refer to the [facility] website for the visitor guidelines for any additional information.   If you received a COVID test during your pre-op visit  it is requested that you wear a mask when out in public, stay away from anyone that may not be feeling well and notify your surgeon if you develop symptoms. If you have been in contact with anyone that has tested positive in the last 10 days please notify you surgeon.      Pre-operative 4 CHG Bathing Instructions   You can play a key role in reducing the risk of infection after surgery. Your skin needs to be as free of germs as possible. You can reduce the number of germs on your skin by washing with CHG (chlorhexidine gluconate) soap before surgery. CHG is an antiseptic soap that kills germs and continues to kill germs even after washing.   DO NOT use if you have an allergy to chlorhexidine/CHG or antibacterial soaps. If your skin becomes reddened or irritated, stop using the CHG and notify one of our RNs at [phone].   Please shower with the CHG soap starting 4 days before surgery using the following schedule:  Please keep in mind the following:  DO NOT shave, including legs and underarms, starting the day of your first shower.   You may shave your face at any point before/day of surgery.  Place clean sheets on your bed the day you start using CHG soap. Use a clean washcloth (not used since being washed) for each shower. DO NOT sleep with pets once you start using the CHG.   CHG Shower Instructions:  Wash your face and private area with normal soap. If you choose to wash your hair, wash first with your normal shampoo.  After you use shampoo/soap, rinse your hair and body thoroughly to remove  shampoo/soap residue.  Turn the water OFF and apply  bottle of CHG soap to a CLEAN washcloth.  Apply CHG soap ONLY FROM YOUR NECK DOWN TO YOUR TOES (washing for 3-5 minutes)  DO NOT use CHG soap on face, private areas, open wounds, or sores.  Pay special attention to the area where your surgery is being performed.  If you are having back surgery, having someone wash your back for you may be helpful. Wait 2 minutes after CHG soap is applied, then you may rinse off the CHG soap.  Pat dry with a clean towel  Put on clean clothes/pajamas   If you choose to wear lotion, please use ONLY the CHG-compatible lotions that are listed below.  Additional instructions for the day of surgery:  If you choose, you may shower the morning of surgery with an antibacterial soap.  DO NOT APPLY any lotions, deodorants, cologne, or perfumes.   Do not bring valuables to the hospital. [facility] is not responsible for any belongings/valuables. Do not wear nail polish, gel polish, artificial nails, or any other type of covering on natural nails (fingers and toes) Do not wear jewelry or makeup Put on clean/comfortable clothes.  Please brush your teeth.  Ask your nurse before applying any prescription medications to the skin.     CHG Compatible Lotions   Aveeno Moisturizing lotion  Cetaphil Moisturizing Cream  Cetaphil Moisturizing Lotion  Clairol Herbal Essence Moisturizing Lotion, Dry Skin  Clairol Herbal Essence Moisturizing Lotion, Extra Dry Skin  Clairol Herbal Essence Moisturizing Lotion, Normal Skin  Curel Age Defying Therapeutic Moisturizing Lotion with Alpha Hydroxy  Curel Extreme Care Body Lotion  Curel Soothing Hands Moisturizing Hand Lotion  Curel Therapeutic Moisturizing Cream, Fragrance-Free  Curel Therapeutic Moisturizing Lotion, Fragrance-Free  Curel Therapeutic Moisturizing Lotion, Original Formula  Eucerin Daily Replenishing Lotion  Eucerin Dry Skin Therapy Plus Alpha Hydroxy Crme   Eucerin Dry Skin Therapy Plus Alpha Hydroxy Lotion  Eucerin Original Crme  Eucerin Original Lotion  Eucerin Plus Crme Eucerin Plus Lotion  Eucerin TriLipid Replenishing Lotion  Keri Anti-Bacterial Hand Lotion  Keri Deep Conditioning Original Lotion Dry Skin Formula Softly Scented  Keri Deep Conditioning Original Lotion, Fragrance Free Sensitive Skin Formula  Keri Lotion Fast Absorbing Fragrance Free Sensitive Skin Formula  Keri Lotion Fast Absorbing Softly Scented Dry Skin Formula  Keri Original Lotion  Keri Skin Renewal Lotion Keri Silky Smooth Lotion  Keri Silky Smooth Sensitive Skin Lotion  Nivea Body Creamy Conditioning Oil  Nivea Body Extra Enriched Lotion  Nivea Body Original Lotion  Nivea Body Sheer Moisturizing Lotion Nivea Crme  Nivea Skin Firming Lotion  NutraDerm 30 Skin Lotion  NutraDerm Skin Lotion  NutraDerm Therapeutic Skin Cream  NutraDerm Therapeutic Skin Lotion  ProShield Protective Hand Cream  Provon moisturizing lotion  Please read over the following fact sheets that  you were given.

## 2024-04-29 ENCOUNTER — Other Ambulatory Visit: Payer: Self-pay

## 2024-04-29 ENCOUNTER — Inpatient Hospital Stay (HOSPITAL_COMMUNITY)
Admission: RE | Admit: 2024-04-29 | Discharge: 2024-04-29 | Disposition: A | Source: Ambulatory Visit | Attending: Neurological Surgery

## 2024-04-29 ENCOUNTER — Encounter (HOSPITAL_COMMUNITY): Payer: Self-pay

## 2024-04-29 VITALS — BP 122/82 | HR 80 | Temp 98.3°F | Resp 18 | Ht 73.0 in | Wt 306.8 lb

## 2024-04-29 DIAGNOSIS — Z01818 Encounter for other preprocedural examination: Secondary | ICD-10-CM

## 2024-04-29 DIAGNOSIS — E119 Type 2 diabetes mellitus without complications: Secondary | ICD-10-CM

## 2024-04-29 DIAGNOSIS — Z01812 Encounter for preprocedural laboratory examination: Secondary | ICD-10-CM | POA: Diagnosis present

## 2024-04-29 LAB — GLUCOSE, CAPILLARY: Glucose-Capillary: 97 mg/dL (ref 70–99)

## 2024-04-29 LAB — SURGICAL PCR SCREEN
MRSA, PCR: NEGATIVE
Staphylococcus aureus: NEGATIVE

## 2024-04-29 LAB — HEMOGLOBIN A1C
Hgb A1c MFr Bld: 5.3 % (ref 4.8–5.6)
Mean Plasma Glucose: 105.41 mg/dL

## 2024-04-29 LAB — TYPE AND SCREEN
ABO/RH(D): A POS
Antibody Screen: NEGATIVE

## 2024-04-29 NOTE — Progress Notes (Signed)
 PCP - Vaughn Trudy COME- [facility], [place] Cardiologist - Jayson Debera COME LOV 03/13/24  PPM/ICD -denies  Device Orders -  Rep Notified -   Chest x-ray - na EKG - 03/13/24 Stress Test - 8/32/22 ECHO - 01/05/21 Cardiac Cath - denies  Sleep Study - yes+OSA CPAP - sleep study  Fasting Blood Sugar - 100-115 Checks Blood Sugar 1-2 times a week  Last dose of GLP1 agonist-  04/18/24 GLP1 instructions: Hold Ozempic 7 days prior to surgery. Do not take after December 8.  Blood Thinner Instructions:na Aspirin  Instructions:patient reports he stopped his aspirin  per Dr. Thayer instructions on December 8.  ERAS Protcol -NPO PRE-SURGERY Ensure or G2-   COVID TEST- na   Anesthesia review: no  Patient denies shortness of breath, fever, cough and chest pain at PAT appointment   All instructions explained to the patient, with a verbal understanding of the material. Patient agrees to go over the instructions while at home for a better understanding. Patient also instructed to self quarantine after being tested for COVID-19. The opportunity to ask questions was provided.

## 2024-04-29 NOTE — Progress Notes (Signed)
 Surgical Instructions   Your procedure is scheduled on Tuesday, December 16th. Report to [facility] Main Entrance A at 12:00 A.M., then check in with the Admitting office. Any questions or running late day of surgery: call [phone]  Questions prior to your surgery date: call [phone], Monday-Friday, 8am-4pm. If you experience any cold or flu symptoms such as cough, fever, chills, shortness of breath, etc. between now and your scheduled surgery, please notify us  at the above number.     Remember:  Do not eat or drink anything after midnight the night before your surgery. This includes no gum, mints, or hard candy.   Take these medicines the morning of surgery with A SIP OF WATER  allopurinol  (ZYLOPRIM )  amLODipine  (NORVASC )  omeprazole  (PRILOSEC)   May take these medicines IF NEEDED: NONE   Follow your surgeon's instructions on when to stop Asprin.  If no instructions were given by your surgeon then you will need to call the office to get those instructions.     One week prior to surgery, STOP taking any Aleve, Naproxen, Motrin, Goody's, BC's, all herbal medications, fish oil, and non-prescription vitamins. This includes your ibuprofen (ADVIL).                      Do NOT Smoke (Tobacco/Vaping) for 24 hours prior to your procedure.  If you use a CPAP at night, you may bring your mask/headgear for your overnight stay.   You will be asked to remove any contacts, glasses, piercing's, hearing aid's, dentures/partials prior to surgery. Please bring cases for these items if needed.    Patients discharged the day of surgery will not be allowed to drive home, and someone needs to stay with them for 24 hours.  SURGICAL WAITING ROOM VISITATION Patients may have no more than 2 support people in the waiting area - these visitors may rotate.   Pre-op nurse will coordinate an appropriate time for 1 ADULT support person, who may not rotate, to accompany patient in pre-op.  Children  under the age of 55 must have an adult with them who is not the patient and must remain in the main waiting area with an adult.  If the patient needs to stay at the hospital during part of their recovery, the visitor guidelines for inpatient rooms apply.  Please refer to the [facility] website for the visitor guidelines for any additional information.   If you received a COVID test during your pre-op visit  it is requested that you wear a mask when out in public, stay away from anyone that may not be feeling well and notify your surgeon if you develop symptoms. If you have been in contact with anyone that has tested positive in the last 10 days please notify you surgeon.      Pre-operative 4 CHG Bathing Instructions   You can play a key role in reducing the risk of infection after surgery. Your skin needs to be as free of germs as possible. You can reduce the number of germs on your skin by washing with CHG (chlorhexidine  gluconate) soap before surgery. CHG is an antiseptic soap that kills germs and continues to kill germs even after washing.   DO NOT use if you have an allergy to chlorhexidine /CHG or antibacterial soaps. If your skin becomes reddened or irritated, stop using the CHG and notify one of our RNs at [phone].   Please shower with the CHG soap starting 4 days before surgery using  the following schedule:     Please keep in mind the following:  DO NOT shave, including legs and underarms, starting the day of your first shower.   You may shave your face at any point before/day of surgery.  Place clean sheets on your bed the day you start using CHG soap. Use a clean washcloth (not used since being washed) for each shower. DO NOT sleep with pets once you start using the CHG.   CHG Shower Instructions:  Wash your face and private area with normal soap. If you choose to wash your hair, wash first with your normal shampoo.  After you use shampoo/soap, rinse your hair and body  thoroughly to remove shampoo/soap residue.  Turn the water OFF and apply  bottle of CHG soap to a CLEAN washcloth.  Apply CHG soap ONLY FROM YOUR NECK DOWN TO YOUR TOES (washing for 3-5 minutes)  DO NOT use CHG soap on face, private areas, open wounds, or sores.  Pay special attention to the area where your surgery is being performed.  If you are having back surgery, having someone wash your back for you may be helpful. Wait 2 minutes after CHG soap is applied, then you may rinse off the CHG soap.  Pat dry with a clean towel  Put on clean clothes/pajamas   If you choose to wear lotion, please use ONLY the CHG-compatible lotions that are listed below.  Additional instructions for the day of surgery:  If you choose, you may shower the morning of surgery with an antibacterial soap.  DO NOT APPLY any lotions, deodorants, cologne, or perfumes.   Do not bring valuables to the hospital. [facility] is not responsible for any belongings/valuables. Do not wear nail polish, gel polish, artificial nails, or any other type of covering on natural nails (fingers and toes) Do not wear jewelry or makeup Put on clean/comfortable clothes.  Please brush your teeth.  Ask your nurse before applying any prescription medications to the skin.     CHG Compatible Lotions   Aveeno Moisturizing lotion  Cetaphil Moisturizing Cream  Cetaphil Moisturizing Lotion  Clairol Herbal Essence Moisturizing Lotion, Dry Skin  Clairol Herbal Essence Moisturizing Lotion, Extra Dry Skin  Clairol Herbal Essence Moisturizing Lotion, Normal Skin  Curel Age Defying Therapeutic Moisturizing Lotion with Alpha Hydroxy  Curel Extreme Care Body Lotion  Curel Soothing Hands Moisturizing Hand Lotion  Curel Therapeutic Moisturizing Cream, Fragrance-Free  Curel Therapeutic Moisturizing Lotion, Fragrance-Free  Curel Therapeutic Moisturizing Lotion, Original Formula  Eucerin Daily Replenishing Lotion  Eucerin Dry Skin Therapy Plus  Alpha Hydroxy Crme  Eucerin Dry Skin Therapy Plus Alpha Hydroxy Lotion  Eucerin Original Crme  Eucerin Original Lotion  Eucerin Plus Crme Eucerin Plus Lotion  Eucerin TriLipid Replenishing Lotion  Keri Anti-Bacterial Hand Lotion  Keri Deep Conditioning Original Lotion Dry Skin Formula Softly Scented  Keri Deep Conditioning Original Lotion, Fragrance Free Sensitive Skin Formula  Keri Lotion Fast Absorbing Fragrance Free Sensitive Skin Formula  Keri Lotion Fast Absorbing Softly Scented Dry Skin Formula  Keri Original Lotion  Keri Skin Renewal Lotion Keri Silky Smooth Lotion  Keri Silky Smooth Sensitive Skin Lotion  Nivea Body Creamy Conditioning Oil  Nivea Body Extra Enriched Teacher, Adult Education Moisturizing Lotion Nivea Crme  Nivea Skin Firming Lotion  NutraDerm 30 Skin Lotion  NutraDerm Skin Lotion  NutraDerm Therapeutic Skin Cream  NutraDerm Therapeutic Skin Lotion  ProShield Protective Hand Cream  Provon moisturizing lotion  Please  read over the following fact sheets that you were given.

## 2024-04-30 ENCOUNTER — Encounter (HOSPITAL_COMMUNITY): Payer: Self-pay | Admitting: Neurological Surgery

## 2024-04-30 ENCOUNTER — Ambulatory Visit (HOSPITAL_COMMUNITY): Payer: Self-pay

## 2024-04-30 ENCOUNTER — Encounter (HOSPITAL_COMMUNITY): Admission: RE | Disposition: A | Payer: Self-pay | Source: Home / Self Care | Attending: Neurological Surgery

## 2024-04-30 ENCOUNTER — Observation Stay (HOSPITAL_COMMUNITY)
Admission: RE | Admit: 2024-04-30 | Discharge: 2024-05-01 | Disposition: A | Attending: Neurological Surgery | Admitting: Neurological Surgery

## 2024-04-30 ENCOUNTER — Ambulatory Visit (HOSPITAL_BASED_OUTPATIENT_CLINIC_OR_DEPARTMENT_OTHER): Payer: Self-pay

## 2024-04-30 ENCOUNTER — Ambulatory Visit (HOSPITAL_COMMUNITY)

## 2024-04-30 DIAGNOSIS — M4712 Other spondylosis with myelopathy, cervical region: Secondary | ICD-10-CM | POA: Diagnosis present

## 2024-04-30 DIAGNOSIS — M4722 Other spondylosis with radiculopathy, cervical region: Secondary | ICD-10-CM | POA: Diagnosis not present

## 2024-04-30 DIAGNOSIS — G4730 Sleep apnea, unspecified: Secondary | ICD-10-CM | POA: Diagnosis not present

## 2024-04-30 DIAGNOSIS — Z79899 Other long term (current) drug therapy: Secondary | ICD-10-CM | POA: Diagnosis not present

## 2024-04-30 DIAGNOSIS — E119 Type 2 diabetes mellitus without complications: Secondary | ICD-10-CM | POA: Diagnosis not present

## 2024-04-30 DIAGNOSIS — I10 Essential (primary) hypertension: Secondary | ICD-10-CM | POA: Diagnosis not present

## 2024-04-30 DIAGNOSIS — M4802 Spinal stenosis, cervical region: Secondary | ICD-10-CM | POA: Diagnosis not present

## 2024-04-30 DIAGNOSIS — Z87891 Personal history of nicotine dependence: Secondary | ICD-10-CM | POA: Diagnosis not present

## 2024-04-30 DIAGNOSIS — Z01818 Encounter for other preprocedural examination: Secondary | ICD-10-CM

## 2024-04-30 DIAGNOSIS — G959 Disease of spinal cord, unspecified: Principal | ICD-10-CM | POA: Diagnosis present

## 2024-04-30 HISTORY — PX: ANTERIOR CERVICAL DECOMP/DISCECTOMY FUSION: SHX1161

## 2024-04-30 LAB — GLUCOSE, CAPILLARY
Glucose-Capillary: 172 mg/dL — ABNORMAL HIGH (ref 70–99)
Glucose-Capillary: 153 mg/dL — ABNORMAL HIGH (ref 70–99)
Glucose-Capillary: 161 mg/dL — ABNORMAL HIGH (ref 70–99)
Glucose-Capillary: 246 mg/dL — ABNORMAL HIGH (ref 70–99)

## 2024-04-30 LAB — ABO/RH: ABO/RH(D): A POS

## 2024-04-30 LAB — POCT I-STAT, CHEM 8
BUN: 26 mg/dL — ABNORMAL HIGH (ref 8–23)
Calcium, Ion: 1.19 mmol/L (ref 1.15–1.40)
Chloride: 105 mmol/L (ref 98–111)
Creatinine, Ser: 1 mg/dL (ref 0.61–1.24)
Glucose, Bld: 148 mg/dL — ABNORMAL HIGH (ref 70–99)
HCT: 43 % (ref 39.0–52.0)
Hemoglobin: 14.6 g/dL (ref 13.0–17.0)
Potassium: 4.1 mmol/L (ref 3.5–5.1)
Sodium: 142 mmol/L (ref 135–145)
TCO2: 26 mmol/L (ref 22–32)

## 2024-04-30 SURGERY — ANTERIOR CERVICAL DECOMPRESSION/DISCECTOMY FUSION 1 LEVEL
Anesthesia: General | Site: Spine Cervical

## 2024-04-30 MED ORDER — POLYETHYLENE GLYCOL 3350 17 G PO PACK: 17.0000 g | PACK | Freq: Every day | ORAL | Status: DC | PRN

## 2024-04-30 MED ORDER — SODIUM CHLORIDE 0.9% FLUSH: 3.0000 mL | INTRAVENOUS | Status: DC | PRN

## 2024-04-30 MED ORDER — SUGAMMADEX SODIUM 200 MG/2ML IV SOLN
INTRAVENOUS | Status: DC | PRN
  Administered 2024-04-30 (×2): 200 mg via INTRAVENOUS

## 2024-04-30 MED ORDER — SUGAMMADEX SODIUM 200 MG/2ML IV SOLN
INTRAVENOUS | Status: AC
  Filled 2024-04-30: qty 2

## 2024-04-30 MED ORDER — CHLORHEXIDINE GLUCONATE CLOTH 2 % EX PADS: 6.0000 | MEDICATED_PAD | Freq: Once | CUTANEOUS | Status: DC

## 2024-04-30 MED ORDER — FENTANYL CITRATE (PF) 250 MCG/5ML IJ SOLN
INTRAMUSCULAR | Status: AC
  Filled 2024-04-30: qty 5

## 2024-04-30 MED ORDER — ROCURONIUM BROMIDE 10 MG/ML (PF) SYRINGE
PREFILLED_SYRINGE | INTRAVENOUS | Status: DC | PRN
  Administered 2024-04-30: 14:00:00 60 mg via INTRAVENOUS
  Administered 2024-04-30: 15:00:00 20 mg via INTRAVENOUS
  Administered 2024-04-30 (×2): 10 mg via INTRAVENOUS

## 2024-04-30 MED ORDER — LIDOCAINE-EPINEPHRINE 1 %-1:100000 IJ SOLN
INTRAMUSCULAR | Status: DC | PRN
  Administered 2024-04-30: 15:00:00 5 mL

## 2024-04-30 MED ORDER — EPHEDRINE 5 MG/ML INJ
INTRAVENOUS | Status: AC
  Filled 2024-04-30: qty 5

## 2024-04-30 MED ORDER — ONDANSETRON HCL 4 MG/2ML IJ SOLN: 4.0000 mg | Freq: Four times a day (QID) | INTRAMUSCULAR | Status: DC | PRN

## 2024-04-30 MED ORDER — CHLORHEXIDINE GLUCONATE 0.12 % MT SOLN
15.0000 mL | Freq: Once | OROMUCOSAL | Status: AC
  Administered 2024-04-30: 13:00:00 15 mL via OROMUCOSAL
  Filled 2024-04-30: qty 15

## 2024-04-30 MED ORDER — DEXAMETHASONE SOD PHOSPHATE PF 10 MG/ML IJ SOLN
INTRAMUSCULAR | Status: DC | PRN
  Administered 2024-04-30 (×2): 5 mg via INTRAVENOUS

## 2024-04-30 MED ORDER — ROCURONIUM BROMIDE 10 MG/ML (PF) SYRINGE
PREFILLED_SYRINGE | INTRAVENOUS | Status: AC
  Filled 2024-04-30: qty 10

## 2024-04-30 MED ORDER — DOCUSATE SODIUM 100 MG PO CAPS
100.0000 mg | ORAL_CAPSULE | Freq: Two times a day (BID) | ORAL | Status: DC
  Administered 2024-04-30 – 2024-05-01 (×2): 100 mg via ORAL
  Filled 2024-04-30 (×2): qty 1

## 2024-04-30 MED ORDER — AMLODIPINE BESYLATE 10 MG PO TABS
10.0000 mg | ORAL_TABLET | Freq: Every day | ORAL | Status: DC
  Administered 2024-05-01: 10:00:00 10 mg via ORAL
  Filled 2024-04-30: qty 1

## 2024-04-30 MED ORDER — HYDROMORPHONE HCL 1 MG/ML IJ SOLN
0.2500 mg | INTRAMUSCULAR | Status: DC | PRN
  Administered 2024-04-30 (×2): 0.5 mg via INTRAVENOUS

## 2024-04-30 MED ORDER — MIDAZOLAM HCL (PF) 2 MG/2ML IJ SOLN
INTRAMUSCULAR | Status: DC | PRN
  Administered 2024-04-30: 14:00:00 2 mg via INTRAVENOUS

## 2024-04-30 MED ORDER — PHENYLEPHRINE 80 MCG/ML (10ML) SYRINGE FOR IV PUSH (FOR BLOOD PRESSURE SUPPORT)
PREFILLED_SYRINGE | INTRAVENOUS | Status: DC | PRN
  Administered 2024-04-30: 14:00:00 40 ug via INTRAVENOUS

## 2024-04-30 MED ORDER — CEFAZOLIN SODIUM-DEXTROSE 3-4 GM/150ML-% IV SOLN
3.0000 g | INTRAVENOUS | Status: AC
  Administered 2024-04-30: 14:00:00 3 g via INTRAVENOUS
  Filled 2024-04-30: qty 150

## 2024-04-30 MED ORDER — BISACODYL 10 MG RE SUPP: 10.0000 mg | Freq: Every day | RECTAL | Status: DC | PRN

## 2024-04-30 MED ORDER — MENTHOL 3 MG MT LOZG: 1.0000 | LOZENGE | OROMUCOSAL | Status: DC | PRN

## 2024-04-30 MED ORDER — INSULIN ASPART 100 UNIT/ML IJ SOLN: 0.0000 [IU] | Freq: Three times a day (TID) | INTRAMUSCULAR | Status: DC

## 2024-04-30 MED ORDER — SODIUM CHLORIDE 0.9 % IV SOLN
250.0000 mL | INTRAVENOUS | Status: DC
  Administered 2024-04-30: 19:00:00 250 mL via INTRAVENOUS

## 2024-04-30 MED ORDER — BUPIVACAINE HCL (PF) 0.5 % IJ SOLN
INTRAMUSCULAR | Status: AC
  Filled 2024-04-30: qty 30

## 2024-04-30 MED ORDER — METHOCARBAMOL 500 MG PO TABS
500.0000 mg | ORAL_TABLET | Freq: Four times a day (QID) | ORAL | Status: DC | PRN
  Administered 2024-04-30 – 2024-05-01 (×2): 500 mg via ORAL
  Filled 2024-04-30 (×2): qty 1

## 2024-04-30 MED ORDER — LORATADINE 10 MG PO TABS: 10.0000 mg | ORAL_TABLET | Freq: Every day | ORAL | Status: DC

## 2024-04-30 MED ORDER — OXYCODONE-ACETAMINOPHEN 5-325 MG PO TABS
1.0000 | ORAL_TABLET | Freq: Four times a day (QID) | ORAL | Status: DC | PRN
  Administered 2024-04-30 – 2024-05-01 (×3): 2 via ORAL
  Filled 2024-04-30 (×3): qty 2

## 2024-04-30 MED ORDER — FENTANYL CITRATE (PF) 250 MCG/5ML IJ SOLN
INTRAMUSCULAR | Status: DC | PRN
  Administered 2024-04-30: 14:00:00 100 ug via INTRAVENOUS
  Administered 2024-04-30 (×3): 50 ug via INTRAVENOUS

## 2024-04-30 MED ORDER — PROPOFOL 10 MG/ML IV BOLUS
INTRAVENOUS | Status: AC
  Filled 2024-04-30: qty 20

## 2024-04-30 MED ORDER — INSULIN ASPART 100 UNIT/ML IJ SOLN
0.0000 [IU] | Freq: Every day | INTRAMUSCULAR | Status: DC
  Administered 2024-04-30: 21:00:00 2 [IU] via SUBCUTANEOUS
  Filled 2024-04-30: qty 2

## 2024-04-30 MED ORDER — THROMBIN 5000 UNITS EX SOLR
OROMUCOSAL | Status: DC | PRN
  Administered 2024-04-30: 15:00:00 5 mL via TOPICAL

## 2024-04-30 MED ORDER — PHENYLEPHRINE 80 MCG/ML (10ML) SYRINGE FOR IV PUSH (FOR BLOOD PRESSURE SUPPORT)
PREFILLED_SYRINGE | INTRAVENOUS | Status: AC
  Filled 2024-04-30: qty 10

## 2024-04-30 MED ORDER — HYDROMORPHONE HCL 1 MG/ML IJ SOLN: 0.5000 mg | INTRAMUSCULAR | Status: DC | PRN

## 2024-04-30 MED ORDER — ACETAMINOPHEN 650 MG RE SUPP: 650.0000 mg | RECTAL | Status: DC | PRN

## 2024-04-30 MED ORDER — HYDROCHLOROTHIAZIDE 25 MG PO TABS
25.0000 mg | ORAL_TABLET | Freq: Every day | ORAL | Status: DC
  Administered 2024-05-01: 10:00:00 25 mg via ORAL
  Filled 2024-04-30: qty 1

## 2024-04-30 MED ORDER — INSULIN ASPART 100 UNIT/ML IJ SOLN: 0.0000 [IU] | INTRAMUSCULAR | Status: DC | PRN

## 2024-04-30 MED ORDER — SODIUM CHLORIDE 0.9% FLUSH
3.0000 mL | Freq: Two times a day (BID) | INTRAVENOUS | Status: DC
  Administered 2024-04-30 – 2024-05-01 (×2): 3 mL via INTRAVENOUS

## 2024-04-30 MED ORDER — ONDANSETRON HCL 4 MG PO TABS: 4.0000 mg | ORAL_TABLET | Freq: Four times a day (QID) | ORAL | Status: DC | PRN

## 2024-04-30 MED ORDER — LACTATED RINGERS IV SOLN: INTRAVENOUS | Status: DC

## 2024-04-30 MED ORDER — LIDOCAINE-EPINEPHRINE 1 %-1:100000 IJ SOLN
INTRAMUSCULAR | Status: AC
  Filled 2024-04-30: qty 1

## 2024-04-30 MED ORDER — SENNA 8.6 MG PO TABS
1.0000 | ORAL_TABLET | Freq: Two times a day (BID) | ORAL | Status: DC
  Administered 2024-04-30 – 2024-05-01 (×2): 8.6 mg via ORAL
  Filled 2024-04-30 (×2): qty 1

## 2024-04-30 MED ORDER — ONDANSETRON HCL 4 MG/2ML IJ SOLN
INTRAMUSCULAR | Status: DC | PRN
  Administered 2024-04-30: 14:00:00 4 mg via INTRAVENOUS

## 2024-04-30 MED ORDER — CEFAZOLIN SODIUM-DEXTROSE 2-4 GM/100ML-% IV SOLN
2.0000 g | Freq: Three times a day (TID) | INTRAVENOUS | Status: AC
  Administered 2024-04-30 – 2024-05-01 (×2): 2 g via INTRAVENOUS
  Filled 2024-04-30 (×2): qty 100

## 2024-04-30 MED ORDER — LOSARTAN POTASSIUM-HCTZ 100-25 MG PO TABS: 1.0000 | ORAL_TABLET | Freq: Every day | ORAL | Status: DC

## 2024-04-30 MED ORDER — LATANOPROST 0.005 % OP SOLN
1.0000 [drp] | Freq: Every day | OPHTHALMIC | Status: DC
  Administered 2024-04-30: 21:00:00 1 [drp] via OPHTHALMIC
  Filled 2024-04-30: qty 2.5

## 2024-04-30 MED ORDER — ALLOPURINOL 300 MG PO TABS
300.0000 mg | ORAL_TABLET | Freq: Every day | ORAL | Status: DC
  Administered 2024-05-01: 10:00:00 300 mg via ORAL
  Filled 2024-04-30: qty 1

## 2024-04-30 MED ORDER — LIDOCAINE 2% (20 MG/ML) 5 ML SYRINGE
INTRAMUSCULAR | Status: AC
  Filled 2024-04-30: qty 5

## 2024-04-30 MED ORDER — LOSARTAN POTASSIUM 50 MG PO TABS
100.0000 mg | ORAL_TABLET | Freq: Every day | ORAL | Status: DC
  Administered 2024-05-01: 10:00:00 100 mg via ORAL
  Filled 2024-04-30: qty 2

## 2024-04-30 MED ORDER — BUPIVACAINE HCL (PF) 0.5 % IJ SOLN
INTRAMUSCULAR | Status: DC | PRN
  Administered 2024-04-30: 15:00:00 5 mL

## 2024-04-30 MED ORDER — INSULIN ASPART 100 UNIT/ML IJ SOLN
0.0000 [IU] | Freq: Three times a day (TID) | INTRAMUSCULAR | Status: DC
  Administered 2024-05-01: 07:00:00 3 [IU] via SUBCUTANEOUS
  Filled 2024-04-30: qty 3

## 2024-04-30 MED ORDER — PHENYLEPHRINE HCL-NACL 20-0.9 MG/250ML-% IV SOLN
INTRAVENOUS | Status: DC | PRN
  Administered 2024-04-30: 14:00:00 30 ug/min via INTRAVENOUS

## 2024-04-30 MED ORDER — 0.9 % SODIUM CHLORIDE (POUR BTL) OPTIME
TOPICAL | Status: DC | PRN
  Administered 2024-04-30: 14:00:00 1000 mL

## 2024-04-30 MED ORDER — PROPOFOL 10 MG/ML IV BOLUS
INTRAVENOUS | Status: DC | PRN
  Administered 2024-04-30: 14:00:00 150 mg via INTRAVENOUS

## 2024-04-30 MED ORDER — METHOCARBAMOL 1000 MG/10ML IJ SOLN: 500.0000 mg | Freq: Four times a day (QID) | INTRAMUSCULAR | Status: DC | PRN

## 2024-04-30 MED ORDER — FLEET ENEMA RE ENEM: 1.0000 | ENEMA | Freq: Once | RECTAL | Status: DC | PRN

## 2024-04-30 MED ORDER — LIDOCAINE 2% (20 MG/ML) 5 ML SYRINGE
INTRAMUSCULAR | Status: DC | PRN
  Administered 2024-04-30: 14:00:00 60 mg via INTRAVENOUS

## 2024-04-30 MED ORDER — ONDANSETRON HCL 4 MG/2ML IJ SOLN
INTRAMUSCULAR | Status: AC
  Filled 2024-04-30: qty 2

## 2024-04-30 MED ORDER — EPHEDRINE SULFATE-NACL 50-0.9 MG/10ML-% IV SOSY
PREFILLED_SYRINGE | INTRAVENOUS | Status: DC | PRN
  Administered 2024-04-30: 15:00:00 5 mg via INTRAVENOUS

## 2024-04-30 MED ORDER — MIDAZOLAM HCL 2 MG/2ML IJ SOLN
INTRAMUSCULAR | Status: AC
  Filled 2024-04-30: qty 2

## 2024-04-30 MED ORDER — PHENOL 1.4 % MT LIQD: 1.0000 | OROMUCOSAL | Status: DC | PRN

## 2024-04-30 MED ORDER — ALUM & MAG HYDROXIDE-SIMETH 200-200-20 MG/5ML PO SUSP: 30.0000 mL | Freq: Four times a day (QID) | ORAL | Status: DC | PRN

## 2024-04-30 MED ORDER — SODIUM CHLORIDE 0.9 % IV SOLN: INTRAVENOUS | Status: DC | PRN

## 2024-04-30 MED ORDER — PANTOPRAZOLE SODIUM 40 MG PO TBEC
40.0000 mg | DELAYED_RELEASE_TABLET | Freq: Every day | ORAL | Status: DC
  Administered 2024-05-01: 10:00:00 40 mg via ORAL
  Filled 2024-04-30: qty 1

## 2024-04-30 MED ORDER — ACETAMINOPHEN 325 MG PO TABS: 650.0000 mg | ORAL_TABLET | ORAL | Status: DC | PRN

## 2024-04-30 MED ORDER — ORAL CARE MOUTH RINSE: 15.0000 mL | Freq: Once | OROMUCOSAL | Status: AC

## 2024-04-30 MED ORDER — HYDROMORPHONE HCL 1 MG/ML IJ SOLN
INTRAMUSCULAR | Status: AC
  Filled 2024-04-30: qty 1

## 2024-04-30 MED ORDER — THROMBIN 5000 UNITS EX KIT
PACK | CUTANEOUS | Status: AC
  Filled 2024-04-30: qty 1

## 2024-04-30 SURGICAL SUPPLY — 42 items
ALLOGRFT BNE OSSIFUSE FBR 1CC (Bone Implant) IMPLANT
BAG COUNTER SPONGE SURGICOUNT (BAG) ×1 IMPLANT
BAND RUBBER 3X1/6 STRL (MISCELLANEOUS) ×2 IMPLANT
BIT DRILL ACP 15 (DRILL) IMPLANT
BIT DRILL NEURO 2X3.1 SFT TUCH (MISCELLANEOUS) ×1 IMPLANT
BNDG GAUZE DERMACEA FLUFF 4 (GAUZE/BANDAGES/DRESSINGS) IMPLANT
BUR BARREL STRAIGHT FLUTE 4.0 (BURR) IMPLANT
CAGE CERV MOD 7X17X14 7D (Cage) IMPLANT
CANISTER SUCTION 3000ML PPV (SUCTIONS) ×1 IMPLANT
DERMABOND ADVANCED .7 DNX12 (GAUZE/BANDAGES/DRESSINGS) ×1 IMPLANT
DRAPE LAPAROTOMY 100X72 PEDS (DRAPES) ×1 IMPLANT
DRAPE MICROSCOPE LEICA (MISCELLANEOUS) IMPLANT
DURAPREP 6ML APPLICATOR 50/CS (WOUND CARE) ×1 IMPLANT
ELECT COATED BLADE 2.86 ST (ELECTRODE) ×1 IMPLANT
ELECTRODE REM PT RTRN 9FT ADLT (ELECTROSURGICAL) ×1 IMPLANT
GAUZE 4X4 16PLY ~~LOC~~+RFID DBL (SPONGE) IMPLANT
GLOVE BIOGEL PI IND STRL 8.5 (GLOVE) ×1 IMPLANT
GLOVE BIOGEL PI MICRO 8.5 (GLOVE) ×1 IMPLANT
GOWN STRL REUS W/ TWL LRG LVL3 (GOWN DISPOSABLE) IMPLANT
GOWN STRL REUS W/ TWL XL LVL3 (GOWN DISPOSABLE) ×1 IMPLANT
GOWN STRL REUS W/TWL 2XL LVL3 (GOWN DISPOSABLE) ×1 IMPLANT
HALTER HD/CHIN CERV TRACTION D (MISCELLANEOUS) ×1 IMPLANT
HEMOSTAT POWDER KIT SURGIFOAM (HEMOSTASIS) ×1 IMPLANT
KIT BASIN OR (CUSTOM PROCEDURE TRAY) ×1 IMPLANT
NDL HYPO 22X1.5 SAFETY MO (MISCELLANEOUS) ×1 IMPLANT
NDL SPNL 22GX3.5 QUINCKE BK (NEEDLE) ×1 IMPLANT
NEEDLE HYPO 22X1.5 SAFETY MO (MISCELLANEOUS) ×1 IMPLANT
NEEDLE SPNL 22GX3.5 QUINCKE BK (NEEDLE) ×1 IMPLANT
PACK LAMINECTOMY NEURO (CUSTOM PROCEDURE TRAY) ×1 IMPLANT
PAD ARMBOARD POSITIONER FOAM (MISCELLANEOUS) ×3 IMPLANT
PATTIES SURGICAL .5 X1 (DISPOSABLE) ×1 IMPLANT
PLATE ACP 1-LEVEL1.6V22 (Plate) IMPLANT
SCREW ACP VA ST 3.5X15 (Screw) IMPLANT
SET WALTER ACTIVATION W/DRAPE (SET/KITS/TRAYS/PACK) ×1 IMPLANT
SOLN 0.9% NACL POUR BTL 1000ML (IV SOLUTION) ×1 IMPLANT
SOLN STERILE WATER BTL 1000 ML (IV SOLUTION) ×1 IMPLANT
SPIKE FLUID TRANSFER (MISCELLANEOUS) ×1 IMPLANT
SPONGE INTESTINAL PEANUT (DISPOSABLE) ×1 IMPLANT
SUT VIC AB 4-0 RB1 18 (SUTURE) ×2 IMPLANT
TOWEL GREEN STERILE (TOWEL DISPOSABLE) ×1 IMPLANT
TOWEL GREEN STERILE FF (TOWEL DISPOSABLE) ×1 IMPLANT
TUBING FEATHERFLOW (TUBING) ×1 IMPLANT

## 2024-04-30 NOTE — H&P (Signed)
 Ross J Baugher Jr. is an 68 y.o. male.   Chief Complaint: Neck and arm pain generalized weakness HPI: The patient is a 68 year old individual who has been seen in our practice over 30 years ago.  He had a herniated lumbar disc at that time.  He had surgery by Dr. Fairy Levels and recovered well from that process but lately he's been developing increasing symptoms of pain and weakness in his left upper extremity is been worked up with an MRI of the cervical spine that was performed on November 7.  This study demonstrates that the patient has a high-grade stenosis at the level of C4-C5 with flattening of the cervical spinal cord intrinsic cord signal change.  He notes he been having some symptoms in the left upper extremity is advised that he should have this worked up further as seen a week ago in the office and I advised urgent surgical decompression of C4-C5.  Symptoms in clued burning dysesthetic sensation and now the patient experiencing symptoms on the right side also.  He doesn't feel that his legs are weak but I note that he walks with a very unstable spastic gait.  Past medical history is notable for history of sleep apnea and chronic lymphocytic leukemia is followed by cancer Center at [facility] and apparently has a condition that has been stable he's been using a sleep apnea machine is diabetic and his A1c is now at 5.7 he's lost about 35 pounds of weight.  He's been using [employer] in addition to changing his diet completely he notes that his stamina on his feet has not been the same he's had difficulties with balance noted in his review of systems.  Past Medical History:  Diagnosis Date   Anal fissure    Arthritis    CLL (chronic lymphocytic leukemia) (HCC)    DDD (degenerative disc disease)    GERD (gastroesophageal reflux disease)    Glaucoma    Gout    Hemorrhoids    Hypertension    OSA on CPAP    Rectal bleeding    Type 2 diabetes mellitus (HCC)     Past Surgical History:   Procedure Laterality Date   EYE SURGERY Right    laser    GLAUCOMA SURGERY Bilateral    LUMBAR DISC SURGERY     L5   PYLOROPLASTY     pyloric stenosis, as infant   TONSILLECTOMY      Family History  Problem Relation Age of Onset   Colon cancer Mother    Breast cancer Mother    Prostate cancer Father    Testicular cancer Father    Stomach cancer Paternal Grandmother    Diabetes Maternal Grandmother    Heart disease Maternal Grandmother    Heart disease Maternal Grandfather    Other Son        stomach aneurysm   Social History:  reports that he quit smoking about 32 years ago. His smoking use included cigarettes. He started smoking about 52 years ago. He has a 20 pack-year smoking history. He has never been exposed to tobacco smoke. He has never used smokeless tobacco. He reports current alcohol use of about 2.0 standard drinks of alcohol per week. He reports that he does not use drugs.  Allergies: Allergies[1]  No medications prior to admission.    Results for orders placed or performed during the hospital encounter of 04/29/24 (from the past 48 hours)  Glucose, capillary     Status: None  Collection Time: 04/29/24  1:00 PM  Result Value Ref Range   Glucose-Capillary 97 70 - 99 mg/dL    Comment: Glucose reference range applies only to samples taken after fasting for at least 8 hours.  Hemoglobin A1c per protocol     Status: None   Collection Time: 04/29/24  1:42 PM  Result Value Ref Range   Hgb A1c MFr Bld 5.3 4.8 - 5.6 %    Comment: (NOTE) Diagnosis of Diabetes The following HbA1c ranges recommended by the American Diabetes Association (ADA) may be used as an aid in the diagnosis of diabetes mellitus.  Hemoglobin             Suggested A1C NGSP%              Diagnosis  <5.7                   Non Diabetic  5.7-6.4                Pre-Diabetic  >6.4                   Diabetic  <7.0                   Glycemic control for                       adults with  diabetes.     Mean Plasma Glucose 105.41 mg/dL    Comment: Performed at [facility], [address], [place], [place] [address]  Type and screen     Status: None   Collection Time: 04/29/24  2:03 PM  Result Value Ref Range   ABO/RH(D) A POS    Antibody Screen NEG    Sample Expiration 05/13/2024,2359    Extend sample reason      NO TRANSFUSIONS OR PREGNANCY IN THE PAST 3 MONTHS Performed at [facility], [address], [place], [place] [address]   Surgical pcr screen     Status: None   Collection Time: 04/29/24  4:38 PM   Specimen: Nasal Mucosa; Nasal Swab  Result Value Ref Range   MRSA, PCR NEGATIVE NEGATIVE   Staphylococcus aureus NEGATIVE NEGATIVE    Comment: (NOTE) The Xpert SA Assay (FDA approved for NASAL specimens in patients 37 years of age and older), is one component of a comprehensive surveillance program. It is not intended to diagnose infection nor to guide or monitor treatment. Performed at [facility], [address] [place], [place] [address]    No results found.  Review of Systems  Constitutional:  Positive for activity change.  Musculoskeletal:  Positive for gait problem, myalgias, neck pain and neck stiffness.  Neurological:  Positive for weakness and numbness.  All other systems reviewed and are negative.   There were no vitals taken for this visit. Physical Exam Constitutional:      Appearance: Normal appearance.  HENT:     Head: Normocephalic and atraumatic.     Right Ear: Tympanic membrane, ear canal and external ear normal.     Left Ear: Tympanic membrane, ear canal and external ear normal.     Nose: Nose normal.     Mouth/Throat:     Mouth: Mucous membranes are moist.     Pharynx: Oropharynx is clear.  Eyes:     Extraocular Movements: Extraocular movements intact.     Conjunctiva/sclera: Conjunctivae normal.     Pupils:  Pupils are equal, round, and reactive to light.  Neck:     Comments: Decreased range of motion  turning 30 maxillary left and right flexing and extending less than 5 with a significant Lhermitte's phenomenon. Cardiovascular:     Rate and Rhythm: Normal rate and regular rhythm.     Pulses: Normal pulses.     Heart sounds: Normal heart sounds.  Pulmonary:     Effort: Pulmonary effort is normal.     Breath sounds: Normal breath sounds.  Abdominal:     General: Abdomen is flat. Bowel sounds are normal.     Palpations: Abdomen is soft.  Musculoskeletal:        General: Normal range of motion.  Skin:    General: Skin is warm and dry.     Capillary Refill: Capillary refill takes less than 2 seconds.  Neurological:     General: No focal deficit present.     Mental Status: He is alert and oriented to person, place, and time.     Comments: Moderate weakness in the deltoids biceps triceps and grips with marked spasticity in the upper extremities.  3+ reflexes in the biceps and triceps 3+ reflexes in the patella a and Achilles upgoing Babinski's bilaterally wide-based gait with spastic mobility of his lower extremities.  Psychiatric:        Mood and Affect: Mood normal.        Behavior: Behavior normal.        Thought Content: Thought content normal.        Judgment: Judgment normal.      Assessment/Plan Severe cervical stenosis with myelopathy and cord signal change at the level of C4-C5.  When anterior cervical decompression C4-C5 arthrodesis with structural spacer and allograft.  Anterior plate fixation.  Victory JINNY Gens, MD 04/30/2024, 11:40 AM       [1] No Known Allergies

## 2024-04-30 NOTE — Plan of Care (Signed)

## 2024-04-30 NOTE — Transfer of Care (Signed)
 Immediate Anesthesia Transfer of Care Note  Patient: Ross [name].  Procedure(s) Performed: ANTERIOR CERVICAL DECOMPRESSION/DISCECTOMY FUSION cervical four-five (Spine Cervical)  Patient Location: PACU  Anesthesia Type:General  Level of Consciousness: awake and alert   Airway & Oxygen Therapy: Patient Spontanous Breathing and Patient connected to face mask oxygen  Post-op Assessment: Report given to RN and Post -op Vital signs reviewed and stable  Post vital signs: Reviewed and stable  Last Vitals:  Vitals Value Taken Time  BP 142/76 04/30/24 16:24  Temp    Pulse 84 04/30/24 16:28  Resp 10 04/30/24 16:28  SpO2 93 % 04/30/24 16:28  Vitals shown include unfiled device data.  Last Pain:  Vitals:   04/30/24 1239  TempSrc:   PainSc: 3       Patients Stated Pain Goal: 4 (04/30/24 1239)  Complications: No notable events documented.

## 2024-04-30 NOTE — Anesthesia Preprocedure Evaluation (Addendum)
 Anesthesia Evaluation  Patient identified by MRN, date of birth, ID band Patient awake    Reviewed: Allergy & Precautions, H&P , NPO status , Patient's Chart, lab work & pertinent test results  Airway Mallampati: II  TM Distance: >3 FB Neck ROM: Full    Dental no notable dental hx. (+) Teeth Intact, Dental Advisory Given   Pulmonary sleep apnea and Continuous Positive Airway Pressure Ventilation , former smoker   Pulmonary exam normal breath sounds clear to auscultation       Cardiovascular hypertension, Pt. on medications  Rhythm:Regular Rate:Normal     Neuro/Psych negative neurological ROS  negative psych ROS   GI/Hepatic Neg liver ROS,GERD  Medicated,,  Endo/Other  diabetes, Type 2  Class 3 obesity  Renal/GU negative Renal ROS  negative genitourinary   Musculoskeletal  (+) Arthritis , Osteoarthritis,    Abdominal   Peds  Hematology negative hematology ROS (+)   Anesthesia Other Findings   Reproductive/Obstetrics negative OB ROS                              Anesthesia Physical Anesthesia Plan  ASA: 3  Anesthesia Plan: General   Post-op Pain Management: Ofirmev  IV (intra-op)*   Induction: Intravenous  PONV Risk Score and Plan: 3 and Ondansetron , Dexamethasone  and Treatment may vary due to age or medical condition  Airway Management Planned: Oral ETT and Video Laryngoscope Planned  Additional Equipment:   Intra-op Plan:   Post-operative Plan: Extubation in OR  Informed Consent: I have reviewed the patients History and Physical, chart, labs and discussed the procedure including the risks, benefits and alternatives for the proposed anesthesia with the patient or authorized representative who has indicated his/her understanding and acceptance.     Dental advisory given  Plan Discussed with: CRNA  Anesthesia Plan Comments:          Anesthesia Quick Evaluation

## 2024-04-30 NOTE — Anesthesia Procedure Notes (Signed)
 Procedure Name: Intubation Date/Time: 04/30/2024 2:16 PM  Performed by: Roslynn Waddell LABOR, CRNAPre-anesthesia Checklist: Patient identified, Emergency Drugs available, Suction available and Patient being monitored Patient Re-evaluated:Patient Re-evaluated prior to induction Oxygen Delivery Method: Circle System Utilized Preoxygenation: Pre-oxygenation with 100% oxygen Induction Type: IV induction Ventilation: Oral airway inserted - appropriate to patient size Laryngoscope Size: Glidescope and 4 Grade View: Grade I Tube type: Oral Tube size: 7.5 mm Number of attempts: 1 Airway Equipment and Method: Stylet and Oral airway Placement Confirmation: ETT inserted through vocal cords under direct vision, positive ETCO2 and breath sounds checked- equal and bilateral Secured at: 23 cm Tube secured with: Tape Dental Injury: Teeth and Oropharynx as per pre-operative assessment  Comments: Atraumatic induction/intubation. Dentition and oral mucosa as per preop.

## 2024-04-30 NOTE — Anesthesia Postprocedure Evaluation (Signed)
 Anesthesia Post Note  Patient: Ross [name].  Procedure(s) Performed: ANTERIOR CERVICAL DECOMPRESSION/DISCECTOMY FUSION cervical four-five (Spine Cervical)     Patient location during evaluation: PACU Anesthesia Type: General Level of consciousness: awake and alert Pain management: pain level controlled Vital Signs Assessment: post-procedure vital signs reviewed and stable Respiratory status: spontaneous breathing, nonlabored ventilation, respiratory function stable and patient connected to nasal cannula oxygen Cardiovascular status: blood pressure returned to baseline and stable Postop Assessment: no apparent nausea or vomiting Anesthetic complications: no   No notable events documented.  Last Vitals:  Vitals:   04/30/24 1715 04/30/24 1730  BP: 136/78 137/79  Pulse: 86 82  Resp: (!) 24 14  Temp:    SpO2: 92% 92%    Last Pain:  Vitals:   04/30/24 1718  TempSrc:   PainSc: 5                  Ross [name],W. EDMOND

## 2024-04-30 NOTE — Op Note (Signed)
 Date of surgery: 04/30/2024 Preoperative diagnosis: Cervical spondylosis C4-C5 with myelopathy and radiculopathy Postoperative diagnosis: Same Procedure: Anterior cervical decompression C4-C5 arthrodesis with structural spacer allograft anterior plate fixation Surgeon: Victory Gens Anesthesia: General endotracheal Indications: Ross [name] is a 68 year old individual who has had significant shoulder and neck pain and weakness generalized throughout all 4 extremities.  He has a markedly spastic gait.  He was advised on the need for surgery to decompress the spinal cord and stabilize his neck.  Procedure: Patient was brought to the operating room supine on the stretcher.  After the smooth induction of general tracheal anesthesia he was placed in 5 pounds of halter traction.  The neck was prepped with alcohol and DuraPrep and draped in a sterile fashion.  A transverse incision was made in the upper portion of the neck on the left side and carried down through the platysma.  The plane between the sternocleidomastoid and the strap muscles was dissected bluntly into the prevertebral space was reached.  First identifiable disc space was noted to be that of C4-C5.  The disc space was then fully isolated and the self-retaining Caspar type retractor was placed under the longus coli muscle which was stripped off of either side of midline.  The Walter arm was then attached to the table and the retractor was secured to the anterior border of the vertebrae.  C4-C5 was then opened with 15 blade removing the anterior portion of the disc anterior osteophytes were also ground down using a Leksell rongeur and a high-speed drill with a 2 mm bit.  The disc space was then entered and a substantial quantity of severely degenerated desiccated disc material was removed but the space was noted to be rather closed and gradual dissection allowed placement of self-retaining disc spreader.  I then the dissection was carried inferiorly to  the region of the posterior longitudinal ligament.  Here there was substantial quantities of severely desiccated disc material that was densely adherent and partially calcified to the inferior vertebral body of C4.  This was drilled down and removed and ultimately the region of the posterior longitudinal ligament was encountered.  Fragments of disc were removed from this region before dissecting laterally enough to do a decompression down to the dura and the nerve root.  Then by working from the left side towards the right side we lifted the ligament as it was thinned from any osteophytic material.  This allowed for good decompression across this midline off to the right side.  The right lateral recess and foramen was decompressed also.  Epidural venous bleeding was encountered which required several tamponade's with Surgifoam.  This was later irrigated away.  In the end the common dural tube centrally was well decompressed along with the nerve roots on both sides.  The interspace was then sized for an appropriate size spacer and was felt that a 7 x 14 x 17 mm spacer with 7 degrees lordosis would fit best this was filled with demineralized bone matrix and placed into the interspace.  Traction was removed at this point and a 22 mm standard size ACP plate was fitted to the ventral aspect of the vertebral bodies at C4 and C5 secured with 15 mm variable angle screws.  Final radiographs identified good position of the construct.  With this hemostasis was achieved and all the soft tissues some additional bone graft was placed in the lateral gutters on either side of the spacer.  Then after careful inspection to assure hemostasis the platysma  was closed with 4-0 Vicryl in interrupted fashion 4-0 Vicryl was used in the subcuticular skin Dermabond was placed on the skin.  Blood loss was estimated at 75 cc.

## 2024-05-01 DIAGNOSIS — M4712 Other spondylosis with myelopathy, cervical region: Secondary | ICD-10-CM | POA: Diagnosis not present

## 2024-05-01 LAB — GLUCOSE, CAPILLARY: Glucose-Capillary: 150 mg/dL — ABNORMAL HIGH (ref 70–99)

## 2024-05-01 MED ORDER — METHOCARBAMOL 500 MG PO TABS: 500.0000 mg | ORAL_TABLET | Freq: Four times a day (QID) | ORAL | 3 refills | Status: AC | PRN

## 2024-05-01 NOTE — Discharge Summary (Signed)
 Physician Discharge Summary  Patient ID: Ross [name]. MRN: [id number] DOB/AGE: 01/31/1956 68 y.o.  Admit date: 04/30/2024 Discharge date: 05/01/2024  Admission Diagnoses: Cervical spondylosis with myelopathy and radiculopathy spasticity of lower extremities  Discharge Diagnoses: Cervical spondylosis with myelopathy and radiculopathy C4-C5 diabetes mellitus.  Spasticity of lower extremities Principal Problem:   Cervical myelopathy [facility])   Discharged Condition: good  Hospital Course: Patient tolerated surgery well.  He has less pain and numbness in his upper extremities.  Consults: None  Significant Diagnostic Studies: None  Treatments: surgery: See op note  Discharge Exam: Blood pressure 138/82, pulse 87, temperature 97.7 F (36.5 C), temperature source Oral, resp. rate 20, height 6' 1 (1.854 m), weight (!) 137 kg, SpO2 100%. Incision is clean and dry.  Station and gait are stable spasticity seems slightly improved in his gait.  Disposition: Discharge disposition: 01-Home or Self Care       Discharge Instructions     Call MD for:  redness, tenderness, or signs of infection (pain, swelling, redness, odor or green/yellow discharge around incision site)   Complete by: As directed    Call MD for:  severe uncontrolled pain   Complete by: As directed    Call MD for:  temperature >100.4   Complete by: As directed    Discharge wound care:   Complete by: As directed    Okay to shower. Do not apply salves or appointments to incision. No heavy lifting with the upper extremities greater than 15 pounds. May resume driving when not requiring pain medication and patient feels comfortable with doing so.   Incentive spirometry RT   Complete by: As directed    Increase activity slowly   Complete by: As directed       Allergies as of 05/01/2024   No Known Allergies      Medication List     TAKE these medications    allopurinol  300 MG tablet Commonly known as:  ZYLOPRIM  Take 1 tablet (300 mg total) by mouth daily.   amLODipine  10 MG tablet Commonly known as: NORVASC  Take 10 mg by mouth daily.   aspirin  81 MG tablet Take 81 mg by mouth daily.   celecoxib  200 MG capsule Commonly known as: CELEBREX  Take 200 mg by mouth daily.   cetirizine 10 MG tablet Commonly known as: ZYRTEC Take 10 mg by mouth daily as needed.   colchicine  0.6 MG tablet Take 2 tablets at first sign of gout flare then repeat in 2 hours 1 tablet, take 1 tablet a day until resolved (or 10 days maximum).   ibuprofen 200 MG tablet Commonly known as: ADVIL Take 400 mg by mouth every 6 (six) hours as needed for mild pain (pain score 1-3) or moderate pain (pain score 4-6).   latanoprost  0.005 % ophthalmic solution Commonly known as: XALATAN  Place 1 drop into both eyes at bedtime.   losartan -hydrochlorothiazide  100-25 MG tablet Commonly known as: HYZAAR Take 1 tablet by mouth daily.   methocarbamol  500 MG tablet Commonly known as: ROBAXIN  Take 1 tablet (500 mg total) by mouth every 6 (six) hours as needed for muscle spasms.   multivitamin tablet Take 1 tablet by mouth. Once or twice a week   omeprazole  20 MG capsule Commonly known as: PRILOSEC TAKE 1 CAPSULE BY MOUTH EVERY MORNING.   oxyCODONE -acetaminophen  5-325 MG tablet Commonly known as: PERCOCET/ROXICET Take 1 tablet by mouth every 4 (four) hours as needed for moderate pain (pain score 4-6) or severe pain (pain score  7-10).   OZEMPIC (0.25 OR 0.5 MG/DOSE) [place] Inject 0.5 mg into the skin once a week.   sildenafil  20 MG tablet Commonly known as: REVATIO  Take 2-5 tablets as needed once every 48 hours for erectile dysfunction               Discharge Care Instructions  (From admission, onward)           Start     Ordered   05/01/24 0000  Discharge wound care:       Comments: Okay to shower. Do not apply salves or appointments to incision. No heavy lifting with the upper extremities greater than  15 pounds. May resume driving when not requiring pain medication and patient feels comfortable with doing so.   05/01/24 0844             Signed: Victory PARAS [name] 05/01/2024, 8:44 AM

## 2024-05-01 NOTE — Discharge Instructions (Signed)
 Wound Care Leave incision open to air. You may shower. Do not scrub directly on incision.  Do not put any creams, lotions, or ointments on incision. Activity Walk each and every day, increasing distance each day. No lifting greater than 8 lbs.  Avoid excessive neck motion. No driving for 2 weeks; may ride as a passenger locally.  Diet Resume your normal diet.   Call Your Doctor If Any of These Occur Redness, drainage, or swelling at the wound.  Temperature greater than 101 degrees. Severe pain not relieved by pain medication. Increased difficulty swallowing. Incision starts to come apart. Follow Up Appt Call today for appointment in 3 weeks [phone]) or for problems.  If you have any hardware placed in your spine, you will need an x-ray before your appointment.

## 2024-05-01 NOTE — Progress Notes (Signed)
 PT Cancellation Note  Patient Details Name: Ross [name]. MRN: [id number] DOB: [birth year]   Cancelled Treatment:    Reason Eval/Treat Not Completed: PT screened, no needs identified, will sign off. Discussed pt case with OT who reports pt is currently mobilizing at a modified independent level, and completed stair training. Pt does not require a formal PT evaluation at this time. PT signing off. If needs change, please reconsult.     Leita JONETTA Sable 05/01/2024, 10:12 AM  Leita Sable, PT, DPT Acute Rehabilitation Services Secure Chat Preferred Office: [phone]

## 2024-05-01 NOTE — Progress Notes (Signed)
 Patient alert and oriented, mae's well, voiding adequate amount of urine, swallowing without difficulty, no c/o pain at time of discharge. Patient discharged home with family. Script and discharged instructions given to patient. Patient and family stated understanding of instructions given. Patient has an appointment with Dr. Colon In 3 weeks.

## 2024-05-01 NOTE — Evaluation (Signed)
 Occupational Therapy Evaluation Patient Details Name: Ross [name]. MRN: [id number] DOB: Sep 18, 1955 Today's Date: 05/01/2024   History of Present Illness   Pt is a 68 y.o. male s/p ACDF C4-5. PMH: CLL, arthritis, DDD, GERD, glaucoma, gout, HTN, OSA, DMII     Clinical Impressions PTA, pt lived with wife and was independent in ADL and IADL. Upon eval, pt mod I for UB ADL, and up to min A for LB ADL. Pt educated and demonstrating compensatory techniques for bed mobility, UB ADL, LB ADL, toileting, shower transfers, grooming, and stair training within precautions. Pt CGA for further distance gait trials; discussed use of cane as needed in home setting. All education provided and questions answered. Recommending discharge home with no OT follow up at this time. OT to sign off. Please re-consult if change in status.       If plan is discharge home, recommend the following:   A little help with walking and/or transfers;A little help with bathing/dressing/bathroom;Assistance with cooking/housework;Assist for transportation;Help with stairs or ramp for entrance     Functional Status Assessment   Patient has had a recent decline in their functional status and demonstrates the ability to make significant improvements in function in a reasonable and predictable amount of time.     Equipment Recommendations   None recommended by OT     Recommendations for Other Services         Precautions/Restrictions   Precautions Precautions: Cervical Precaution Booklet Issued: Yes (comment) Recall of Precautions/Restrictions: Intact Precaution/Restrictions Comments: all precautions reviewed within the context of ADL Required Braces or Orthoses:  (no brace needed) Restrictions Weight Bearing Restrictions Per Provider Order: No     Mobility Bed Mobility Overal bed mobility: Needs Assistance Bed Mobility: Rolling, Sidelying to Sit Rolling: Supervision Sidelying to sit:  Supervision            Transfers Overall transfer level: Modified independent                 General transfer comment: for STS      Balance Overall balance assessment: Needs assistance   Sitting balance-Leahy Scale: Good     Standing balance support: No upper extremity supported Standing balance-Leahy Scale: Fair Standing balance comment: S-CGA                           ADL either performed or assessed with clinical judgement   ADL Overall ADL's : Needs assistance/impaired Eating/Feeding: Independent   Grooming: Standing;Modified independent   Upper Body Bathing: Independent;Sitting   Lower Body Bathing: Supervison/ safety;Sit to/from stand   Upper Body Dressing : Modified independent;Sitting   Lower Body Dressing: Minimal assistance;Sit to/from stand Lower Body Dressing Details (indicate cue type and reason): needs A for donning socks within precautions; wears slip in shoes, reports wife can assist with socks as needed Toilet Transfer: Contact guard assist;Ambulation;Supervision/safety     Toileting - Clothing Manipulation Details (indicate cue type and reason): reviewed compensatory strategies Tub/ Shower Transfer: Walk-in shower;Contact guard assist;Ambulation   Functional mobility during ADLs: Contact guard assist;Supervision/safety       Vision Baseline Vision/History: 1 Wears glasses Ability to See in Adequate Light: 0 Adequate Patient Visual Report: No change from baseline Vision Assessment?: No apparent visual deficits     Perception Perception: Not tested       Praxis Praxis: Not tested       Pertinent Vitals/Pain Pain Assessment Pain Assessment: Faces Faces Pain Scale: Hurts  a little bit Pain Location: operative site Pain Descriptors / Indicators: Guarding Pain Intervention(s): Limited activity within patient's tolerance, Monitored during session     Extremity/Trunk Assessment Upper Extremity Assessment Upper  Extremity Assessment: LUE deficits/detail LUE Deficits / Details: continued numbness but reports pain and burning improved   Lower Extremity Assessment Lower Extremity Assessment: Overall WFL for tasks assessed   Cervical / Trunk Assessment Cervical / Trunk Assessment: Neck Surgery   Communication Communication Communication: No apparent difficulties   Cognition Arousal: Alert Behavior During Therapy: WFL for tasks assessed/performed Cognition: No apparent impairments                               Following commands: Intact       Cueing  General Comments   Cueing Techniques: Verbal cues      Exercises     Shoulder Instructions      Home Living Family/patient expects to be discharged to:: Private residence Living Arrangements: Spouse/significant other Kathye) Available Help at Discharge: Family Type of Home: House Home Access: Stairs to enter [employer] of Steps: 3 in front bil rails where he takes dogs out; 2 in garage where he enters home with rail on L Entrance Stairs-Rails: Left Home Layout: Two level;Other (Comment) (has chair lift)     Bathroom Shower/Tub: Walk-in shower (walk in tub)   Bathroom Toilet: Handicapped height     Home Equipment: [employer] (2 wheels);Cane - single point;Shower seat - built in;Grab bars - tub/shower;Hand held shower head;Adaptive equipment (has stair lift) Adaptive Equipment: Reacher        Prior Functioning/Environment Prior Level of Function : Independent/Modified Independent;Driving             Mobility Comments: no AD recently ADLs Comments: independent in ADL and IADL    OT Problem List: Decreased strength;Impaired balance (sitting and/or standing);Pain;Impaired sensation;Decreased knowledge of precautions   OT Treatment/Interventions:        OT Goals(Current goals can be found in the care plan section)   Acute Rehab OT Goals Patient Stated Goal: get better OT Goal  Formulation: With patient Time For Goal Achievement: 05/15/24 Potential to Achieve Goals: Good   OT Frequency:       Co-evaluation              AM-PAC OT 6 Clicks Daily Activity     Outcome Measure Help from another person eating meals?: None Help from another person taking care of personal grooming?: A Little Help from another person toileting, which includes using toliet, bedpan, or urinal?: A Little Help from another person bathing (including washing, rinsing, drying)?: A Little Help from another person to put on and taking off regular upper body clothing?: None Help from another person to put on and taking off regular lower body clothing?: A Little 6 Click Score: 20   End of Session Nurse Communication: Mobility status  Activity Tolerance: Patient tolerated treatment well Patient left: in bed;with call bell/phone within reach;Other (comment) (sitting EOB)  OT Visit Diagnosis: Unsteadiness on feet (R26.81);Muscle weakness (generalized) (M62.81);Pain                Time: 9244-9178 OT Time Calculation (min): 26 min Charges:  OT General Charges $OT Visit: 1 Visit OT Evaluation $OT Eval Low Complexity: 1 Low OT Treatments $Self Care/Home Management : 8-22 mins  Elma JONETTA Lebron FREDERICK, OTR/L [facility] Acute Rehabilitation Office: [phone]   Elma JONETTA Lebron 05/01/2024, 9:07 AM

## 2024-05-02 ENCOUNTER — Encounter (HOSPITAL_COMMUNITY): Payer: Self-pay | Admitting: Neurological Surgery
